# Patient Record
Sex: Female | Born: 1960
Health system: Southern US, Community
[De-identification: ages and names within clinical notes are randomized; demographics above are authoritative.]

## PROBLEM LIST (undated history)

## (undated) DIAGNOSIS — K219 Gastro-esophageal reflux disease without esophagitis: Secondary | ICD-10-CM

## (undated) DIAGNOSIS — E785 Hyperlipidemia, unspecified: Secondary | ICD-10-CM

## (undated) DIAGNOSIS — F419 Anxiety disorder, unspecified: Secondary | ICD-10-CM

## (undated) DIAGNOSIS — H269 Unspecified cataract: Secondary | ICD-10-CM

## (undated) DIAGNOSIS — J189 Pneumonia, unspecified organism: Secondary | ICD-10-CM

## (undated) DIAGNOSIS — R51 Headache: Secondary | ICD-10-CM

## (undated) DIAGNOSIS — D126 Benign neoplasm of colon, unspecified: Secondary | ICD-10-CM

## (undated) DIAGNOSIS — F1910 Other psychoactive substance abuse, uncomplicated: Secondary | ICD-10-CM

## (undated) DIAGNOSIS — K589 Irritable bowel syndrome without diarrhea: Secondary | ICD-10-CM

## (undated) DIAGNOSIS — F1021 Alcohol dependence, in remission: Secondary | ICD-10-CM

## (undated) DIAGNOSIS — R519 Headache, unspecified: Secondary | ICD-10-CM

## (undated) DIAGNOSIS — T7840XA Allergy, unspecified, initial encounter: Secondary | ICD-10-CM

## (undated) DIAGNOSIS — I10 Essential (primary) hypertension: Secondary | ICD-10-CM

## (undated) DIAGNOSIS — F32A Depression, unspecified: Secondary | ICD-10-CM

## (undated) DIAGNOSIS — F329 Major depressive disorder, single episode, unspecified: Secondary | ICD-10-CM

## (undated) DIAGNOSIS — K52839 Microscopic colitis, unspecified: Secondary | ICD-10-CM

## (undated) DIAGNOSIS — Z8489 Family history of other specified conditions: Secondary | ICD-10-CM

## (undated) DIAGNOSIS — D649 Anemia, unspecified: Secondary | ICD-10-CM

## (undated) DIAGNOSIS — G709 Myoneural disorder, unspecified: Secondary | ICD-10-CM

## (undated) DIAGNOSIS — N951 Menopausal and female climacteric states: Secondary | ICD-10-CM

## (undated) DIAGNOSIS — J309 Allergic rhinitis, unspecified: Secondary | ICD-10-CM

## (undated) DIAGNOSIS — F418 Other specified anxiety disorders: Secondary | ICD-10-CM

## (undated) DIAGNOSIS — Z8701 Personal history of pneumonia (recurrent): Secondary | ICD-10-CM

## (undated) DIAGNOSIS — G8929 Other chronic pain: Secondary | ICD-10-CM

## (undated) DIAGNOSIS — M1990 Unspecified osteoarthritis, unspecified site: Secondary | ICD-10-CM

## (undated) HISTORY — PX: EYE SURGERY: SHX253

## (undated) HISTORY — PX: APPENDECTOMY: SHX54

## (undated) HISTORY — DX: Other specified anxiety disorders: F41.8

## (undated) HISTORY — DX: Anemia, unspecified: D64.9

## (undated) HISTORY — DX: Other chronic pain: G89.29

## (undated) HISTORY — DX: Myoneural disorder, unspecified: G70.9

## (undated) HISTORY — DX: Allergy, unspecified, initial encounter: T78.40XA

## (undated) HISTORY — DX: Irritable bowel syndrome, unspecified: K58.9

## (undated) HISTORY — DX: Hyperlipidemia, unspecified: E78.5

## (undated) HISTORY — DX: Gastro-esophageal reflux disease without esophagitis: K21.9

## (undated) HISTORY — PX: POLYPECTOMY: SHX149

## (undated) HISTORY — DX: Other psychoactive substance abuse, uncomplicated: F19.10

## (undated) HISTORY — DX: Essential (primary) hypertension: I10

## (undated) HISTORY — DX: Major depressive disorder, single episode, unspecified: F32.9

## (undated) HISTORY — PX: SHOULDER SURGERY: SHX246

## (undated) HISTORY — DX: Personal history of pneumonia (recurrent): Z87.01

## (undated) HISTORY — DX: Microscopic colitis, unspecified: K52.839

## (undated) HISTORY — DX: Headache, unspecified: R51.9

## (undated) HISTORY — DX: Menopausal and female climacteric states: N95.1

## (undated) HISTORY — DX: Headache: R51

## (undated) HISTORY — PX: NOSE SURGERY: SHX723

## (undated) HISTORY — DX: Pneumonia, unspecified organism: J18.9

## (undated) HISTORY — DX: Alcohol dependence, in remission: F10.21

## (undated) HISTORY — DX: Unspecified osteoarthritis, unspecified site: M19.90

## (undated) HISTORY — DX: Allergic rhinitis, unspecified: J30.9

## (undated) HISTORY — DX: Unspecified cataract: H26.9

## (undated) HISTORY — DX: Depression, unspecified: F32.A

## (undated) HISTORY — DX: Benign neoplasm of colon, unspecified: D12.6

## (undated) HISTORY — DX: Anxiety disorder, unspecified: F41.9

## (undated) HISTORY — PX: OTHER SURGICAL HISTORY: SHX169

## (undated) HISTORY — PX: TONSILLECTOMY: SUR1361

---

## 1988-06-06 HISTORY — PX: ABDOMINAL HYSTERECTOMY: SHX81

## 2000-06-06 HISTORY — PX: LUMBAR DISC SURGERY: SHX700

## 2002-10-10 ENCOUNTER — Other Ambulatory Visit: Admission: RE | Admit: 2002-10-10 | Discharge: 2002-10-10 | Payer: Self-pay | Admitting: Gynecology

## 2004-06-02 ENCOUNTER — Ambulatory Visit (HOSPITAL_COMMUNITY): Admission: RE | Admit: 2004-06-02 | Discharge: 2004-06-02 | Payer: Self-pay

## 2004-06-06 HISTORY — PX: ANTERIOR CERVICAL DECOMP/DISCECTOMY FUSION: SHX1161

## 2004-07-21 ENCOUNTER — Encounter: Admission: RE | Admit: 2004-07-21 | Discharge: 2004-07-21 | Payer: Self-pay | Admitting: Neurosurgery

## 2004-08-16 ENCOUNTER — Encounter: Admission: RE | Admit: 2004-08-16 | Discharge: 2004-08-16 | Payer: Self-pay | Admitting: Neurosurgery

## 2004-10-06 ENCOUNTER — Ambulatory Visit (HOSPITAL_COMMUNITY): Admission: RE | Admit: 2004-10-06 | Discharge: 2004-10-06 | Payer: Self-pay

## 2004-11-17 ENCOUNTER — Ambulatory Visit (HOSPITAL_COMMUNITY): Admission: RE | Admit: 2004-11-17 | Discharge: 2004-11-18 | Payer: Self-pay | Admitting: Neurosurgery

## 2004-12-17 ENCOUNTER — Ambulatory Visit (HOSPITAL_COMMUNITY): Admission: RE | Admit: 2004-12-17 | Discharge: 2004-12-18 | Payer: Self-pay | Admitting: Neurosurgery

## 2008-06-06 HISTORY — PX: OTHER SURGICAL HISTORY: SHX169

## 2008-11-21 ENCOUNTER — Encounter: Admission: RE | Admit: 2008-11-21 | Discharge: 2008-11-21 | Payer: Self-pay | Admitting: Gynecology

## 2009-04-21 ENCOUNTER — Encounter: Admission: RE | Admit: 2009-04-21 | Discharge: 2009-04-21 | Payer: Self-pay | Admitting: Neurosurgery

## 2009-05-14 ENCOUNTER — Encounter: Admission: RE | Admit: 2009-05-14 | Discharge: 2009-05-14 | Payer: Self-pay | Admitting: Neurosurgery

## 2010-04-19 ENCOUNTER — Encounter: Admission: RE | Admit: 2010-04-19 | Discharge: 2010-04-19 | Payer: Self-pay | Admitting: Neurosurgery

## 2010-05-03 ENCOUNTER — Encounter: Admission: RE | Admit: 2010-05-03 | Discharge: 2010-05-03 | Payer: Self-pay | Admitting: Neurosurgery

## 2010-05-19 ENCOUNTER — Encounter
Admission: RE | Admit: 2010-05-19 | Discharge: 2010-05-19 | Payer: Self-pay | Source: Home / Self Care | Attending: Neurosurgery | Admitting: Neurosurgery

## 2010-10-22 NOTE — Op Note (Signed)
NAMENOVELLA, ABRAHA                ACCOUNT NO.:  [id number]   MEDICAL RECORD NO.:  [id number]          PATIENT TYPE:  OIB   LOCATION:  3016                         FACILITY:  [facility]   PHYSICIAN:  Coletta Memos, M.D.     DATE OF BIRTH:  1961-02-14   DATE OF PROCEDURE:  12/17/2004  DATE OF DISCHARGE:                                 OPERATIVE REPORT   Ms. Degrasse is a 50 year old woman who presented initially with low back  pain. I performed a far lateral diskectomy at L4-5 in June. She has done  quite well from that operation and came back wanting to go ahead and get her  cervical procedure done. She has a spondylitic foramen which is narrowed at  C5-6 on the right side. I recommended and she agreed to undergo operative  decompression after conservative measures were not helpful.   OPERATIVE NOTE:  Ms. Rasp was brought to the operating room, intubated and  placed under general anesthetic without difficulty. She was placed with her  head in slight extension without traction on a horseshoe headrest. The neck  was prepped and she was draped in sterile fashion. I infiltrated 3 mL of  0.5% lidocaine with 1:200,000 strength epinephrine starting at the midline  and extending to the medial border of the left sternocleidomastoid at the  level of the cricoid cartilage. I opened the skin with a #10 blade and took  this down to the platysma. I dissected above the platysma rostrally and  caudally. I opened the platysma in a horizontal fashion with Metzenbaum  scissors along the length of the incision. I then dissected inferior to the  platysma rostrally and caudally. I then was able to find with dissection an  avascular plane to the anterior spine, retracting the sternocleidomastoid  and carotid artery along with the jugular structures laterally and the strap  muscles medially. I placed a spinal needle that showed that I was a 6-7. I  then moved that one level. I then opened the disk space with a #15  blade and  removed some disk material. I then placed two distraction pins, one at C5  and the other at C6 and distracted the disk space. The microscope brought in  to aid in microdissection and I started the rest of the disk removal. When  that was performed, I then had some thickened ligament along with  osteophytes from both C5 and C6. I made sure to work out into the right  neural foramen and ensure that there was no pressure left on the right C6  nerve root. I was not nearly as aggressive on the left side as she had no  complaints on the left side. I then irrigated the wound. With Dr. Verlee Rossetti  assistance, we placed 7 mm bone graft into the disk space. We removed the  distraction pins and then placed a 16 mm Synthes ACS plate. The bone graft  was sent Synthes ACCS bone graft. An x-ray showed that the plate, plug and  screws were all in good position. I then irrigated the wound. I then closed  the wound in layered fashion using Vicryl sutures, reapproximating the  platysma and subcutaneous layers. Dermabond used for sterile dressing.       KC/MEDQ  D:  12/17/2004  T:  12/17/2004  Job:  [id number]

## 2010-10-22 NOTE — Op Note (Signed)
NAMERANDA, Elizabeth Long                ACCOUNT NO.:  [id number]   MEDICAL RECORD NO.:  [id number]          PATIENT TYPE:  OIB   LOCATION:  2899                         FACILITY:  [facility]   PHYSICIAN:  Coletta Memos, M.D.     DATE OF BIRTH:  [birth year]   DATE OF PROCEDURE:  11/17/2004  DATE OF DISCHARGE:                                 OPERATIVE REPORT   PREOPERATIVE DIAGNOSIS:  Far lateral disk herniation of L4-5, right.   POSTOPERATIVE DIAGNOSIS:  Herniated nucleus pulposis and lumbar  radiculopathy.   SURGEON:  Coletta Memos, M.D.   ASSISTANT:  None.   ANESTHESIA:  General endotracheal.   COMPLICATIONS:  None.   FINDINGS:  A small hump in the far lateral disk space which corresponded to  the MRI.   INDICATIONS:  Elizabeth Long is a woman who has undergone epidural injections  and other conservative treatment without relief of pain in the right lower  extremity. I felt this did correspond to the right L4-5 disk which was seen  on MRI. Though not large, it was in good location and again it did explain  her symptoms. I recommended and she did agree to undergo operative  decompression.   OPERATIVE NOTE:  Elizabeth Long was brought to the operating room, intubated  and placed under general anesthesia without difficulty. She was rolled prone  onto a Wilson frame and all pressure points were properly padded. A spinal  needle was placed for preoperative localization and showed that the needle  was pointing to the L4 spinous process. I then infiltrated 15 mL of 0.5%  lidocaine and 1:200,000 strength epinephrine into the lumbar region. I  opened the skin with a #10 blade and took this down to the thoracolumbar  fascia sharply. I then exposed the lamina of what was L4. I placed a double-  ended ganglion knife inferior to that lamina and then found the pars  interarticularis of that L4 lamina. I then drilled off the lateral aspect of  it, possibly one-quarter.  I then got underneath that,  removed the  ligamentum flavum and identified the right L4 nerve root. I then used a  nerve hook and probed underneath and felt no disk herniation or compromise  of the nerve root proximally, but going out further lateral I did appreciate  a hump in the disk space. I then opened the disk space with a #15 blade  after coagulating and controlling epidural bleeding. I removed disk in a  piecemeal fashion using microscopic dissection, Kerrison punch and Epstein  curette. When I was satisfied with the decompression, I inspected the nerve  root once more. I felt that  there was no compression on the nerve root nor loose disk pieces. I then  irrigated. I then closed the wound after placing 10 mL of Solu-Medrol over  the resection site. Closed with one-layer fashion, reapproximating the  thoracolumbar fascia, subcutaneous tissue and subcuticular layer. Dermabond  used for sterile dressing.       KC/MEDQ  D:  11/17/2004  T:  11/17/2004  Job:  [id number]

## 2011-06-02 ENCOUNTER — Telehealth: Payer: Self-pay | Admitting: Internal Medicine

## 2011-06-02 NOTE — Telephone Encounter (Signed)
Unable to dial the above number, I only get a fast busy signal.  I will continue to try and call.  (You cant call [place] at [phone] get the same fast busy signal)

## 2011-06-03 NOTE — Telephone Encounter (Signed)
Unable to reach the number. Fast busy signal continures.

## 2011-06-07 HISTORY — PX: COLONOSCOPY: SHX174

## 2011-06-08 NOTE — Telephone Encounter (Signed)
Office is still closed I will try and contact them later

## 2011-06-08 NOTE — Telephone Encounter (Signed)
I have been unable to reach the patient or Dr Vear Clock office.  There is a machine at his office that says their office is closed.  Unable to leave a message.

## 2011-06-09 ENCOUNTER — Encounter: Payer: Self-pay | Admitting: *Deleted

## 2011-06-09 ENCOUNTER — Telehealth: Payer: Self-pay | Admitting: *Deleted

## 2011-06-09 NOTE — Telephone Encounter (Signed)
Spoke with Miachel Roux at Dr. Vear Clock office and told her of patient's OV  Scheduled on 06/21/11. She will fax [place] records on patient.

## 2011-06-09 NOTE — Telephone Encounter (Signed)
New patient letter mailed to patient.

## 2011-06-09 NOTE — Telephone Encounter (Signed)
Spoke with patient and scheduled her with Dr. Juanda Chance on 06/21/11 at 1:45/2:00 PM. She states she saw Dr. Juanda Chance years ago for [facility].

## 2011-06-10 ENCOUNTER — Encounter: Payer: Self-pay | Admitting: *Deleted

## 2011-06-21 ENCOUNTER — Other Ambulatory Visit (INDEPENDENT_AMBULATORY_CARE_PROVIDER_SITE_OTHER): Payer: BC Managed Care – PPO

## 2011-06-21 ENCOUNTER — Encounter: Payer: Self-pay | Admitting: Internal Medicine

## 2011-06-21 ENCOUNTER — Ambulatory Visit (INDEPENDENT_AMBULATORY_CARE_PROVIDER_SITE_OTHER): Payer: Self-pay | Admitting: Internal Medicine

## 2011-06-21 DIAGNOSIS — R197 Diarrhea, unspecified: Secondary | ICD-10-CM

## 2011-06-21 DIAGNOSIS — R634 Abnormal weight loss: Secondary | ICD-10-CM

## 2011-06-21 LAB — SEDIMENTATION RATE: Sed Rate: 21 mm/hr (ref 0–22)

## 2011-06-21 MED ORDER — DIPHENOXYLATE-ATROPINE 2.5-0.025 MG PO TABS: 1.0000 | ORAL_TABLET | ORAL | Status: AC

## 2011-06-21 MED ORDER — PEG-KCL-NACL-NASULF-NA ASC-C 100 G PO SOLR: 1.0000 | Freq: Once | ORAL | Status: DC

## 2011-06-21 MED ORDER — METRONIDAZOLE 250 MG PO TABS: 250.0000 mg | ORAL_TABLET | Freq: Three times a day (TID) | ORAL | Status: AC

## 2011-06-21 NOTE — Patient Instructions (Addendum)
Your physician has requested that you go to the basement for the following lab work before leaving today: Sedimentation Rate, Sprue Profile, Stool for O&P, lactoferrin, C Diff. We have sent the following medications to your pharmacy for you to pick up at your convenience: Lomotil Flagyl You have been scheduled for a colonoscopy. Please follow written instructions given to you at your visit today.  Please pick up your prep kit at the pharmacy within the next 2-3 days. CC: Dr Ch.Phillips

## 2011-06-21 NOTE — Progress Notes (Signed)
Elizabeth Long 13-Dec-1960 MRN [id number]     History of Present Illness:  This is a 51 year old white female with severe diarrhea since she broke up with her boyfriend several months ago. She has lost about 20 pounds from 140 pounds to a current 123 pounds. Her stools are watery and she has them up to 15 times a day but not at night. She denies any rectal bleeding or significant abdominal pain. She has not been on any antibiotics.  We saw her in 1998 for diarrhea and she underwent a colonoscopy and random biopsies of the colon to rule out microscopic colitis. The exam was normal. She was treated with Librax and Levbid. Her symptoms improved but flared up again recently. In 1998 her weight was 110 pounds and she subsequently gained weight up to 150 pounds and only in recent years lost down to 123 pounds. Her appetite has been good and she denies nausea or vomiting. She has urgent bowel movements, usually postprandially. She has been on Librax 3 times a day with no significant improvement. She is also on Topamax 100 mg, Mobic, Imodium and Ativan 2 mg when necessary for stress.   Past Medical History  Diagnosis Date  . Alcoholism     18 and 1/2 years sober  . Anxiety   . Arthritis   . Chronic headaches   . Depression   . GERD (gastroesophageal reflux disease)   . Irritable bowel syndrome (IBS)   . Pneumonia     hx of    Past Surgical History  Procedure Date  . Posterior laminectomy / decompression cervical spine   . Back surgery   . Appendectomy   . Abdominal hysterectomy   . Shoulder surgery   . Tonsillectomy   . Nose surgery     reports that she has quit smoking. She has never used smokeless tobacco. She reports that she does not drink alcohol or use illicit drugs. family history is not on file. Allergies  Allergen Reactions  . Cefotan (Cefotetan Disodium)   . Sulfur         Review of Systems: Denies nausea or vomiting, dysphagia odynophagia  The remainder of the 10 point  ROS is negative except as outlined in H&P   Physical Exam: General appearance  Well developed, in no distress. Eyes- non icteric. HEENT nontraumatic, normocephalic. Mouth no lesions, tongue papillated, no cheilosis. Neck supple without adenopathy, thyroid not enlarged, no carotid bruits, no JVD. Lungs Clear to auscultation bilaterally. Cor normal S1, normal S2, regular rhythm, no murmur,  quiet precordium. Abdomen: Soft abdomen with hyperactive bowel sounds and few abdominal rushes. Nondistended. Nontender. No palpable mass. Liver edge at costal margin. No CVA tenderness. Rectal: Soft Hemoccult negative stool Extremities no pedal edema. Skin no lesions. Neurological alert and oriented x 3. Psychological normal mood and affect.  Assessment and Plan:  Problem #1 Chronic diarrhea with recent exacerbation. She was evaluated for diarrhea in 1998 and felt to have irritable bowel syndrome. This time she has significant weight loss. Her TSH is normal. We will obtain a sprue profile as well as sedimentation rate to rule out inflammatory bowel disease and we will proceed with a colonoscopy and random biopsies to rule out microscopic colitis. We will also rule out Crohn's disease or ulcerative colitis. We will obtain stool specimens for C. difficile, lactoferrin, culture and O&P. She will continue her Librax and antidiarrheal agents as needed. I will also give her an empirical trial of Flagyl 250 mg by mouth  3 times a day for week for bacterial overgrowth.   06/21/2011 Lina Sar

## 2011-06-22 ENCOUNTER — Other Ambulatory Visit: Payer: BC Managed Care – PPO

## 2011-06-22 DIAGNOSIS — R634 Abnormal weight loss: Secondary | ICD-10-CM

## 2011-06-22 DIAGNOSIS — R197 Diarrhea, unspecified: Secondary | ICD-10-CM

## 2011-06-22 LAB — CELIAC PANEL 10
Endomysial Screen: NEGATIVE
Tissue Transglutaminase Ab, IgA: 12.8 U/mL (ref <20)

## 2011-06-23 ENCOUNTER — Telehealth: Payer: Self-pay | Admitting: *Deleted

## 2011-06-23 LAB — FECAL LACTOFERRIN, QUANT: Lactoferrin: POSITIVE

## 2011-06-23 LAB — OVA AND PARASITE SCREEN: OP: NONE SEEN

## 2011-06-23 NOTE — Telephone Encounter (Signed)
Left a message for patient to call me back.

## 2011-06-23 NOTE — Telephone Encounter (Signed)
Message copied by Daphine Deutscher on Thu Jun 23, 2011 11:33 AM ------      Message from: Hart Carwin      Created: Thu Jun 23, 2011 10:53 AM       Please call pt with positive stool test which may indicate colitis. She needs to keep her appointment for colonoscopy. Her sprue profile is negative.

## 2011-06-23 NOTE — Telephone Encounter (Signed)
Spoke with patient and gave her results and recommendations. 

## 2011-06-23 NOTE — Telephone Encounter (Signed)
Message copied by Elizabeth Long on Thu Jun 23, 2011  2:36 PM ------      Message from: Hart Carwin      Created: Thu Jun 23, 2011 10:53 AM       Please call pt with positive stool test which may indicate colitis. She needs to keep her appointment for colonoscopy. Her sprue profile is negative.

## 2011-06-23 NOTE — Telephone Encounter (Signed)
Left a message for patient to call me. 

## 2011-07-06 ENCOUNTER — Encounter: Payer: Self-pay | Admitting: Internal Medicine

## 2011-07-06 ENCOUNTER — Ambulatory Visit (AMBULATORY_SURGERY_CENTER): Payer: BC Managed Care – PPO | Admitting: Internal Medicine

## 2011-07-06 VITALS — BP 102/66 | HR 79 | Temp 96.0°F | Resp 21 | Ht 66.0 in | Wt 123.0 lb

## 2011-07-06 DIAGNOSIS — D126 Benign neoplasm of colon, unspecified: Secondary | ICD-10-CM

## 2011-07-06 DIAGNOSIS — K5289 Other specified noninfective gastroenteritis and colitis: Secondary | ICD-10-CM

## 2011-07-06 DIAGNOSIS — R197 Diarrhea, unspecified: Secondary | ICD-10-CM

## 2011-07-06 MED ORDER — SODIUM CHLORIDE 0.9 % IV SOLN: 500.0000 mL | INTRAVENOUS | Status: DC

## 2011-07-06 NOTE — Progress Notes (Signed)
Patient did not experience any of the following events: a burn prior to discharge; a fall within the facility; wrong site/side/patient/procedure/implant event; or a hospital transfer or hospital admission upon discharge from the facility. (G8907) Patient did not have preoperative order for IV antibiotic SSI prophylaxis. (G8918)  

## 2011-07-06 NOTE — Patient Instructions (Signed)
Resume all medications. Information given on polyps. D/C Information reviewed with family.

## 2011-07-06 NOTE — Progress Notes (Signed)
PROPOFOL ADMINISTERED CONTINUOUSLY THROUGH OUT THE PROCEDURE PER S CAMP CRNA. SEE SCANNED INTRA PROCEDURE REPORT.EWM

## 2011-07-06 NOTE — Op Note (Signed)
[facility] [address] [place], [place]  [address]  COLONOSCOPY PROCEDURE REPORT  PATIENT:  [name], Elizabeth Long  MR#:  [id number] BIRTHDATE:  12-Sep-1960, 50 yrs. old  GENDER:  female ENDOSCOPIST:  Hedwig Morton. Juanda Chance, MD REF. BY:  Loma Sender, M.D. PROCEDURE DATE:  07/06/2011 PROCEDURE:  Colonoscopy with biopsy and snare polypectomy ASA CLASS:  Class I INDICATIONS:  unexplained diarrhea colon 1999 for diarrhea was normal MEDICATIONS:   MAC sedation, administered by CRNA, propofol (Diprivan) 320 mg  DESCRIPTION OF PROCEDURE:   After the risks and benefits and of the procedure were explained, informed consent was obtained. Digital rectal exam was performed and revealed no rectal masses. The LB PCF-H180AL [id number] endoscope was introduced through the anus and advanced to the cecum, which was identified by both the appendix and ileocecal valve.  The quality of the prep was good, using MoviPrep.  The instrument was then slowly withdrawn as the colon was fully examined. <<PROCEDUREIMAGES>>  FINDINGS:  A sessile polyp was found. large carpeted polyp at 70 cm Polyps were snared without cautery. Retrieval was successful (see image1). snare polyp  This was otherwise a normal examination of the colon. Random biopsies were obtained and sent to pathology (see image2, image3, image4, image5, and image6). r/o microscpic colitis   Retroflexed views in the rectum revealed no abnormalities.    The scope was then withdrawn from the patient and the procedure completed.  COMPLICATIONS:  None ENDOSCOPIC IMPRESSION: 1) Sessile polyp 2) Otherwise normal examination RECOMMENDATIONS: 1) Await biopsy results 2) Await pathology results continue Levsin SL.125 mg  REPEAT EXAM:  In 5 year(s) for.  ______________________________ Hedwig Morton. Juanda Chance, MD  CC:  n. eSIGNED:   Hedwig Morton. Brodie at 07/06/2011 12:18 PM  Ilda Basset, [phone]

## 2011-07-07 ENCOUNTER — Telehealth: Payer: Self-pay | Admitting: *Deleted

## 2011-07-07 NOTE — Telephone Encounter (Signed)
Left message with pts permission on number given yesterday in admitting. ewm

## 2011-07-11 ENCOUNTER — Encounter: Payer: Self-pay | Admitting: Internal Medicine

## 2011-07-12 ENCOUNTER — Encounter: Payer: Self-pay | Admitting: *Deleted

## 2011-07-12 ENCOUNTER — Other Ambulatory Visit: Payer: Self-pay | Admitting: *Deleted

## 2011-07-12 ENCOUNTER — Telehealth: Payer: Self-pay | Admitting: *Deleted

## 2011-07-12 DIAGNOSIS — K529 Noninfective gastroenteritis and colitis, unspecified: Secondary | ICD-10-CM

## 2011-07-12 DIAGNOSIS — K52839 Microscopic colitis, unspecified: Secondary | ICD-10-CM | POA: Insufficient documentation

## 2011-07-12 MED ORDER — PREDNISONE 5 MG PO TABS: ORAL_TABLET | ORAL | Status: DC

## 2011-07-12 NOTE — Telephone Encounter (Signed)
Message copied by Daphine Deutscher on Tue Jul 12, 2011  9:27 AM ------      Message from: Hart Carwin      Created: Mon Jul 11, 2011  5:30 PM       Please send Prednisone 5 mg, # 100, 2 po qd x 2 weeks,11/2 =7.5 mg po qd x 2 weeks, 1 po qd x 2 weeks, 1/2=2.5 mg po qd x 2weeks, 1 refill

## 2011-07-12 NOTE — Telephone Encounter (Signed)
Rx had been sent by T. Gordy Levan, RN. Spoke with patient and told her about rx and bx results. Also scheduled her for OV as per letter sent by Dr. Juanda Chance (08/17/11 at 3:45 PM)

## 2011-08-11 ENCOUNTER — Encounter: Payer: Self-pay | Admitting: *Deleted

## 2011-08-17 ENCOUNTER — Encounter: Payer: Self-pay | Admitting: Internal Medicine

## 2011-08-17 ENCOUNTER — Ambulatory Visit (INDEPENDENT_AMBULATORY_CARE_PROVIDER_SITE_OTHER): Payer: BC Managed Care – PPO | Admitting: Internal Medicine

## 2011-08-17 VITALS — BP 102/70 | HR 68 | Ht 66.0 in | Wt 124.6 lb

## 2011-08-17 DIAGNOSIS — K520 Gastroenteritis and colitis due to radiation: Secondary | ICD-10-CM

## 2011-08-17 DIAGNOSIS — R197 Diarrhea, unspecified: Secondary | ICD-10-CM

## 2011-08-17 NOTE — Progress Notes (Signed)
Elizabeth Long 1961-03-15 MRN [id number]   History of Present Illness:  This is a 51 year old white female with a new diagnosis of microscopic colitis. Biopsies showed increased collagen deposits. She has had chronic diarrhea off-and-on which progressed in recent months. There has been associated weight loss. A colonoscopy in January of this year showed a tubular adenoma and microscopic colitis. She discontinued her meloxicam but has remained on Zoloft 100 mg a day. Both medications could cause microscopic colitis. She was started on prednisone 20 mg daily which is being tapered gradually every 2 weeks down to 10 mg a day with complete resolution of her symptoms. She currently has soft bowel movements once or twice a day without abdominal pain or bleeding. She has not taken any Lomotil. Her weight has increased to 124 pounds.   Past Medical History  Diagnosis Date  . Alcoholism     18 and 1/2 years sober  . Anxiety   . Arthritis   . Chronic headaches   . Depression   . GERD (gastroesophageal reflux disease)   . Irritable bowel syndrome (IBS)   . Pneumonia     hx of   . Allergy   . Substance abuse   . Microscopic colitis   . Tubular adenoma of colon    Past Surgical History  Procedure Date  . Posterior laminectomy / decompression cervical spine   . Back surgery   . Appendectomy   . Abdominal hysterectomy   . Shoulder surgery   . Tonsillectomy   . Nose surgery   . Colonoscopy     reports that she has quit smoking. She has never used smokeless tobacco. She reports that she does not drink alcohol or use illicit drugs. @FAMHXP (<SUBSCRIPT> error)@ Allergies  Allergen Reactions  . Cefotan (Cefotetan Disodium)   . Sulfur         Review of Systems: Good appetite. No abdominal pain. Negative for rectal bleeding  The remainder of the 10 point ROS is negative except as outlined in H&P   Physical Exam: General appearance  Well developed, in no distress. Eyes- non  icteric. HEENT nontraumatic, normocephalic. Mouth no lesions, tongue papillated, no cheilosis. Neck supple without adenopathy, thyroid not enlarged, no carotid bruits, no JVD. Lungs Clear to auscultation bilaterally. Cor normal S1, normal S2, regular rhythm, no murmur,  quiet precordium. Abdomen: Soft nontender with normoactive bowel sounds. Rectal: Not done Extremities no pedal edema. Skin no lesions. Neurological alert and oriented x 3. Psychological normal mood and affect.  Assessment and Plan:   Problem #1 Diagnosis of microscopic colitis, likely collagenous. This has much improved on low-dose steroids. She will continue the taper by 5 mg every 2 weeks and will come back to see me in 6-8 weeks. She will continue Zoloft for now. If diarrhea recurs, we may consider the use of Imuran.   08/17/2011 Lina Sar

## 2011-08-17 NOTE — Patient Instructions (Addendum)
Continue Prednisone as per Dr Regino Schultze recommendations. Return to see Dr Juanda Chance in 2 months. CC: Dr Lodema Hong, Dr Loma Sender

## 2011-11-28 ENCOUNTER — Other Ambulatory Visit: Payer: Self-pay

## 2012-10-24 LAB — LIPID PANEL
Cholesterol: 255
HDL: 87
LDL CALC: 149
Triglycerides: 93

## 2012-10-24 LAB — TSH: TSH: 3

## 2012-10-24 LAB — BASIC METABOLIC PANEL
CREATININE: 0.82
Calcium: 10.5 mg/dL
GLUCOSE: 101
Potassium: 4.1 mmol/L
Sodium: 139

## 2013-01-21 ENCOUNTER — Other Ambulatory Visit: Payer: Self-pay | Admitting: Gynecology

## 2014-01-30 ENCOUNTER — Other Ambulatory Visit: Payer: Self-pay | Admitting: Gynecology

## 2014-01-30 DIAGNOSIS — R928 Other abnormal and inconclusive findings on diagnostic imaging of breast: Secondary | ICD-10-CM

## 2014-02-05 ENCOUNTER — Ambulatory Visit
Admission: RE | Admit: 2014-02-05 | Discharge: 2014-02-05 | Disposition: A | Discharge status: Home / Self Care | Payer: BC Managed Care – PPO | Source: Ambulatory Visit | Attending: Gynecology | Admitting: Gynecology

## 2014-02-05 DIAGNOSIS — R928 Other abnormal and inconclusive findings on diagnostic imaging of breast: Secondary | ICD-10-CM

## 2014-11-21 ENCOUNTER — Other Ambulatory Visit: Payer: Self-pay

## 2014-11-21 DIAGNOSIS — Z1231 Encounter for screening mammogram for malignant neoplasm of breast: Secondary | ICD-10-CM

## 2015-02-10 ENCOUNTER — Ambulatory Visit
Admission: RE | Admit: 2015-02-10 | Discharge: 2015-02-10 | Disposition: A | Discharge status: Home / Self Care | Payer: BLUE CROSS/BLUE SHIELD | Source: Ambulatory Visit

## 2015-02-10 DIAGNOSIS — Z1231 Encounter for screening mammogram for malignant neoplasm of breast: Secondary | ICD-10-CM

## 2015-02-13 ENCOUNTER — Other Ambulatory Visit: Payer: Self-pay | Admitting: Obstetrics & Gynecology

## 2015-02-13 DIAGNOSIS — R928 Other abnormal and inconclusive findings on diagnostic imaging of breast: Secondary | ICD-10-CM

## 2015-02-19 ENCOUNTER — Ambulatory Visit
Admission: RE | Admit: 2015-02-19 | Discharge: 2015-02-19 | Disposition: A | Discharge status: Home / Self Care | Payer: BLUE CROSS/BLUE SHIELD | Source: Ambulatory Visit | Attending: Obstetrics & Gynecology | Admitting: Obstetrics & Gynecology

## 2015-02-19 DIAGNOSIS — R928 Other abnormal and inconclusive findings on diagnostic imaging of breast: Secondary | ICD-10-CM

## 2015-08-04 LAB — LIPID PANEL
Cholesterol: 210 mg/dL — AB (ref 0–200)
HDL: 93 mg/dL — AB (ref 35–70)
LDL Cholesterol: 95 mg/dL
Triglycerides: 107 mg/dL (ref 40–160)

## 2015-09-28 ENCOUNTER — Other Ambulatory Visit: Payer: Self-pay | Admitting: Obstetrics & Gynecology

## 2015-09-28 DIAGNOSIS — Z01419 Encounter for gynecological examination (general) (routine) without abnormal findings: Secondary | ICD-10-CM | POA: Diagnosis not present

## 2015-09-28 DIAGNOSIS — N760 Acute vaginitis: Secondary | ICD-10-CM | POA: Diagnosis not present

## 2015-09-29 DIAGNOSIS — Z113 Encounter for screening for infections with a predominantly sexual mode of transmission: Secondary | ICD-10-CM | POA: Diagnosis not present

## 2015-10-23 ENCOUNTER — Ambulatory Visit (INDEPENDENT_AMBULATORY_CARE_PROVIDER_SITE_OTHER): Payer: BLUE CROSS/BLUE SHIELD | Admitting: Family Medicine

## 2015-10-23 ENCOUNTER — Encounter: Payer: Self-pay | Admitting: Family Medicine

## 2015-10-23 VITALS — BP 132/70 | HR 62 | Temp 97.6°F | Ht 65.0 in | Wt 137.0 lb

## 2015-10-23 DIAGNOSIS — R0989 Other specified symptoms and signs involving the circulatory and respiratory systems: Secondary | ICD-10-CM

## 2015-10-23 DIAGNOSIS — G8929 Other chronic pain: Secondary | ICD-10-CM

## 2015-10-23 DIAGNOSIS — M8949 Other hypertrophic osteoarthropathy, multiple sites: Secondary | ICD-10-CM

## 2015-10-23 DIAGNOSIS — M159 Polyosteoarthritis, unspecified: Secondary | ICD-10-CM

## 2015-10-23 DIAGNOSIS — F1021 Alcohol dependence, in remission: Secondary | ICD-10-CM | POA: Diagnosis not present

## 2015-10-23 DIAGNOSIS — K219 Gastro-esophageal reflux disease without esophagitis: Secondary | ICD-10-CM

## 2015-10-23 DIAGNOSIS — K52839 Microscopic colitis, unspecified: Secondary | ICD-10-CM

## 2015-10-23 DIAGNOSIS — N951 Menopausal and female climacteric states: Secondary | ICD-10-CM

## 2015-10-23 DIAGNOSIS — Z780 Asymptomatic menopausal state: Secondary | ICD-10-CM

## 2015-10-23 DIAGNOSIS — F418 Other specified anxiety disorders: Secondary | ICD-10-CM

## 2015-10-23 DIAGNOSIS — M150 Primary generalized (osteo)arthritis: Secondary | ICD-10-CM

## 2015-10-23 DIAGNOSIS — M545 Low back pain: Secondary | ICD-10-CM

## 2015-10-23 DIAGNOSIS — I10 Essential (primary) hypertension: Secondary | ICD-10-CM

## 2015-10-23 DIAGNOSIS — K589 Irritable bowel syndrome without diarrhea: Secondary | ICD-10-CM

## 2015-10-23 MED ORDER — HYDROCHLOROTHIAZIDE 12.5 MG PO CAPS: 12.5000 mg | ORAL_CAPSULE | Freq: Every day | ORAL | Status: DC

## 2015-10-23 MED ORDER — BENZONATATE 100 MG PO CAPS: 100.0000 mg | ORAL_CAPSULE | Freq: Two times a day (BID) | ORAL | Status: DC | PRN

## 2015-10-23 NOTE — Progress Notes (Signed)
BP 132/70 mmHg  Pulse 62  Temp(Src) 97.6 F (36.4 C) (Oral)  Ht 5\' 5"  (1.651 m)  Wt 137 lb (62.143 kg)  BMI 22.80 kg/m2  SpO2 98%   CC: new pt to establish care  Subjective:    Patient ID: Elizabeth Long, female    DOB: 1960/09/30, 55 y.o.   MRN: [id number]  HPI: Elizabeth Long is a 55 y.o. female presenting on 10/23/2015 for Establish Care and Hypertension   Prior saw Dr Hardin Negus who has passed away. She saw him for 47 years. Cares for BF with MS.   HTN - controlled with hctz 12.5mg  daily.   IBS - on librax PRN. H/o microscopic colitis - has seen Dr Olevia Perches with colonoscopy 2013.   Anxiety - on ativan 2mg  nightly for sleep for last 3 years. She also takes effexor 75mg  daily.   Chronic lower back pain and right shoulder pain s/p surgeries - on oxycodone and tramadol prn. Saw Dr Cyndy Freeze. No recent refills. Still has meds at home. Requests we refill this for her. Remotely was on vicodin. Infrequent use. Wants to avoid NSAIDs due to h/o colitis.  Dr Stann Mainland prescribes effexor, estradiol and lorazepam.   Some chest congestion. She has been taking nyquil and robitussin pills. No fevers/chills, but felt feverish last night. She is coughing, minimally productive of mucous. She did recently have URI 1 month ago.   Preventative: COLONOSCOPY Date: 06/2011 TA, microscopic colitis, rpt 5 yrs Olevia Perches) OBGYN Dr Stann Mainland last well woman exam 01/2015, s/p hysterectomy Mammogram yearly - dense breasts, so usually returns for more detailed study.  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 Interim medical history since our last visit reviewed. Allergies and medications reviewed and updated. Current Outpatient Prescriptions on File Prior to Visit  Medication Sig  .  clidinium-chlordiazePOXIDE (LIBRAX) 2.5-5 MG per capsule Takes as directed  . LORazepam (ATIVAN) 2 MG tablet   . meloxicam (MOBIC) 7.5 MG tablet Take 7.5 mg by mouth daily.  Marland Kitchen omeprazole (PRILOSEC) 20 MG capsule One tablet by mouth once daily  . traMADol (ULTRAM) 50 MG tablet Take 50 mg by mouth as needed.   No current facility-administered medications on file prior to visit.    Review of Systems Per HPI unless specifically indicated in ROS section     Objective:    BP 132/70 mmHg  Pulse 62  Temp(Src) 97.6 F (36.4 C) (Oral)  Ht 5\' 5"  (1.651 m)  Wt 137 lb (62.143 kg)  BMI 22.80 kg/m2  SpO2 98%  Wt Readings from Last 3 Encounters:  10/23/15 137 lb (62.143 kg)  08/17/11 124 lb 9.6 oz (56.518 kg)  07/06/11 123 lb (55.792 kg)    Physical Exam  Constitutional: She appears well-developed and well-nourished. No distress.  HENT:  Head: Normocephalic and atraumatic.  Mouth/Throat: Oropharynx is clear and moist. No oropharyngeal exudate.  Eyes: Conjunctivae and EOM are normal. Pupils are equal, round, and reactive to light. No scleral icterus.  Neck: Normal range of motion. Neck supple.  Cardiovascular: Normal rate, regular rhythm, normal heart sounds and intact distal pulses.   No murmur heard. Pulmonary/Chest: Effort normal and breath sounds normal. No respiratory distress. She has no wheezes. She has no rales.  Musculoskeletal: She exhibits no edema.  Lymphadenopathy:    She  has no cervical adenopathy.  Skin: Skin is warm and dry. No rash noted.  Psychiatric: She has a normal mood and affect.  Nursing note and vitals reviewed.      Assessment & Plan:  Over 45 minutes were spent face-to-face with the patient during this encounter and >50% of that time was spent on counseling and coordination of care. EMR reviewed. Records from Dr Hardin Negus, prior PCP reviewed and relevant material sent for scanning. Pt does not want records returned and understands they will otherwise be  shredded. Recent work wellness visit labs also reviewed.  Problem List Items Addressed This Visit    Microscopic colitis    Has seen Dr Olevia Perches dx by biopsy on colonoscopy, completed prednisone course. No further recent diarrheal illness.      History of alcoholism ([place])    18 yrs sober.       Anxiety associated with depression    Seems overall stable on current regimen of effexor XR 75mg  daily, ativan 2mg  nightly. Both prescribed by OBGYN Dr Stann Mainland.      GERD (gastroesophageal reflux disease)    Continue omeprazole 20mg  daily.       Osteoarthritis   Irritable bowel syndrome (IBS)    Continue librax prn. I will refill when next due.       Chest congestion - Primary    With cough. No signs today of bacterial infection - will monitor symptoms for now, discussed red flags to seek urgent care. Trial tessalon perls for cough.      Post menopausal syndrome    On oral and vaginal estrogen per OBGYN.       Essential hypertension    Chronic, stable. Continue low dose hctz daily      Relevant Medications   hydrochlorothiazide (MICROZIDE) 12.5 MG capsule   Chronic lower back pain    Reviewed pain regimen - she rarely uses tramadol and oxycodone, last Rx filled last year. She has also used hydrocodone with relief. I suggested we simplify regimen to tramadol 50mg  BID PRN. Pt agrees and will let me know when she desires refill. She also takes meloxicam 7.5mg  daily.          Follow up plan: Return in about 4 months (around 02/23/2016), or as needed, for annual exam, prior fasting for blood work.  Ria Bush, MD

## 2015-10-23 NOTE — Patient Instructions (Addendum)
Try tessalon perls for cough. Watch for worsening productive cough, fever >101 or worsening after initial improvement - if this happens let us know.  Continue current medicines for now.  Return in 3-4 months for physical. Good to meet you today.

## 2015-10-23 NOTE — Progress Notes (Signed)
Pre visit review using our clinic review tool, if applicable. No additional management support is needed unless otherwise documented below in the visit note. 

## 2015-10-24 ENCOUNTER — Encounter: Payer: Self-pay | Admitting: Family Medicine

## 2015-10-24 ENCOUNTER — Other Ambulatory Visit: Payer: Self-pay | Admitting: Family Medicine

## 2015-10-24 DIAGNOSIS — G8929 Other chronic pain: Secondary | ICD-10-CM | POA: Insufficient documentation

## 2015-10-24 DIAGNOSIS — M545 Low back pain: Secondary | ICD-10-CM

## 2015-10-24 DIAGNOSIS — F418 Other specified anxiety disorders: Secondary | ICD-10-CM | POA: Insufficient documentation

## 2015-10-24 DIAGNOSIS — N951 Menopausal and female climacteric states: Secondary | ICD-10-CM

## 2015-10-24 DIAGNOSIS — I10 Essential (primary) hypertension: Secondary | ICD-10-CM | POA: Insufficient documentation

## 2015-10-24 DIAGNOSIS — K589 Irritable bowel syndrome without diarrhea: Secondary | ICD-10-CM | POA: Insufficient documentation

## 2015-10-24 DIAGNOSIS — F1021 Alcohol dependence, in remission: Secondary | ICD-10-CM | POA: Insufficient documentation

## 2015-10-24 DIAGNOSIS — R0989 Other specified symptoms and signs involving the circulatory and respiratory systems: Secondary | ICD-10-CM | POA: Insufficient documentation

## 2015-10-24 DIAGNOSIS — M1990 Unspecified osteoarthritis, unspecified site: Secondary | ICD-10-CM | POA: Insufficient documentation

## 2015-10-24 DIAGNOSIS — K219 Gastro-esophageal reflux disease without esophagitis: Secondary | ICD-10-CM | POA: Insufficient documentation

## 2015-10-24 HISTORY — DX: Menopausal and female climacteric states: N95.1

## 2015-10-24 HISTORY — DX: Essential (primary) hypertension: I10

## 2015-10-24 NOTE — Assessment & Plan Note (Signed)
-  Continue omeprazole 20 mg daily

## 2015-10-24 NOTE — Assessment & Plan Note (Signed)
On oral and vaginal estrogen per OBGYN.

## 2015-10-24 NOTE — Assessment & Plan Note (Addendum)
Reviewed pain regimen - she rarely uses tramadol and oxycodone, last Rx filled last year. She has also used hydrocodone with relief. I suggested we simplify regimen to tramadol 50mg  BID PRN. Pt agrees and will let me know when she desires refill. She also takes meloxicam 7.5mg  daily.

## 2015-10-24 NOTE — Assessment & Plan Note (Signed)
With cough. No signs today of bacterial infection - will monitor symptoms for now, discussed red flags to seek urgent care. Trial tessalon perls for cough.

## 2015-10-24 NOTE — Assessment & Plan Note (Addendum)
Seems overall stable on current regimen of effexor XR 75mg  daily, ativan 2mg  nightly. Both prescribed by OBGYN Dr Stann Mainland.

## 2015-10-24 NOTE — Assessment & Plan Note (Signed)
Chronic, stable. Continue low dose hctz daily

## 2015-10-24 NOTE — Assessment & Plan Note (Signed)
Continue librax prn. I will refill when next due.

## 2015-10-24 NOTE — Assessment & Plan Note (Signed)
Has seen Dr Olevia Perches dx by biopsy on colonoscopy, completed prednisone course. No further recent diarrheal illness.

## 2015-10-24 NOTE — Assessment & Plan Note (Signed)
18 yrs sober.

## 2015-10-26 MED ORDER — OMEPRAZOLE 20 MG PO CPDR: 20.0000 mg | DELAYED_RELEASE_CAPSULE | Freq: Every day | ORAL | Status: DC

## 2015-10-26 MED ORDER — BENZONATATE 100 MG PO CAPS: 100.0000 mg | ORAL_CAPSULE | Freq: Two times a day (BID) | ORAL | Status: DC | PRN

## 2015-10-26 NOTE — Addendum Note (Signed)
Addended by: Ria Bush on: 10/26/2015 05:25 PM   Modules accepted: Orders, Medications

## 2015-10-27 ENCOUNTER — Other Ambulatory Visit: Payer: Self-pay | Admitting: *Deleted

## 2015-10-27 MED ORDER — BENZONATATE 100 MG PO CAPS: 100.0000 mg | ORAL_CAPSULE | Freq: Two times a day (BID) | ORAL | Status: DC | PRN

## 2015-10-28 DIAGNOSIS — L905 Scar conditions and fibrosis of skin: Secondary | ICD-10-CM | POA: Diagnosis not present

## 2015-10-28 DIAGNOSIS — L820 Inflamed seborrheic keratosis: Secondary | ICD-10-CM | POA: Diagnosis not present

## 2015-11-04 ENCOUNTER — Encounter: Payer: Self-pay | Admitting: *Deleted

## 2015-11-12 ENCOUNTER — Other Ambulatory Visit: Payer: Self-pay | Admitting: Family Medicine

## 2015-11-12 NOTE — Telephone Encounter (Signed)
Ok to refill 

## 2015-11-12 NOTE — Telephone Encounter (Signed)
plz phone in. 

## 2015-11-13 NOTE — Telephone Encounter (Signed)
Prescription called to pharmacy.

## 2015-11-24 ENCOUNTER — Other Ambulatory Visit: Payer: Self-pay | Admitting: *Deleted

## 2015-11-25 ENCOUNTER — Telehealth: Payer: Self-pay

## 2015-11-25 NOTE — Telephone Encounter (Signed)
It looks like she was on this when she est with Dr Darnell Level (prev saw Dr Graylin Shiver this is indeed the case -she has not had a reaction and can remain on it  I will cc her PCP

## 2015-11-25 NOTE — Telephone Encounter (Signed)
Elizabeth Long with [facility] left v/m; received rx for microzide 12.5 mg. [facility] has not filled before and pt is profiled with sensitivity to sulfa drugs. Elizabeth Long request cb. Ref # [id number]. Per 10/23/15 office note where pt established care; HTN had been controlled by HCTZ 12.5 mg.Please advise.

## 2015-11-26 DIAGNOSIS — D3132 Benign neoplasm of left choroid: Secondary | ICD-10-CM | POA: Diagnosis not present

## 2015-11-26 NOTE — Telephone Encounter (Signed)
I agree - should be able to take as was on this previously. Would have pharmacy verify with patient when she picks up.

## 2015-11-26 NOTE — Telephone Encounter (Signed)
[facility] that patient has been on this for quite awhile with no reaction.

## 2015-12-07 ENCOUNTER — Other Ambulatory Visit: Payer: Self-pay | Admitting: *Deleted

## 2015-12-07 ENCOUNTER — Encounter: Payer: Self-pay | Admitting: Family Medicine

## 2015-12-07 MED ORDER — MELOXICAM 7.5 MG PO TABS: 7.5000 mg | ORAL_TABLET | Freq: Every day | ORAL | Status: DC

## 2015-12-08 MED ORDER — MELOXICAM 7.5 MG PO TABS: 7.5000 mg | ORAL_TABLET | Freq: Every day | ORAL | Status: DC

## 2016-01-06 ENCOUNTER — Other Ambulatory Visit: Payer: Self-pay | Admitting: *Deleted

## 2016-01-06 MED ORDER — OMEPRAZOLE 20 MG PO CPDR: 20.0000 mg | DELAYED_RELEASE_CAPSULE | Freq: Every day | ORAL | 2 refills | Status: DC

## 2016-01-14 ENCOUNTER — Other Ambulatory Visit: Payer: Self-pay | Admitting: *Deleted

## 2016-01-14 MED ORDER — OMEPRAZOLE 20 MG PO CPDR: 20.0000 mg | DELAYED_RELEASE_CAPSULE | Freq: Every day | ORAL | 2 refills | Status: DC

## 2016-02-07 ENCOUNTER — Other Ambulatory Visit: Payer: Self-pay | Admitting: Family Medicine

## 2016-02-07 DIAGNOSIS — K52839 Microscopic colitis, unspecified: Secondary | ICD-10-CM

## 2016-02-07 DIAGNOSIS — I10 Essential (primary) hypertension: Secondary | ICD-10-CM

## 2016-02-07 DIAGNOSIS — Z1159 Encounter for screening for other viral diseases: Secondary | ICD-10-CM

## 2016-02-07 DIAGNOSIS — F1021 Alcohol dependence, in remission: Secondary | ICD-10-CM

## 2016-02-07 DIAGNOSIS — R7989 Other specified abnormal findings of blood chemistry: Secondary | ICD-10-CM

## 2016-02-10 ENCOUNTER — Other Ambulatory Visit (INDEPENDENT_AMBULATORY_CARE_PROVIDER_SITE_OTHER): Payer: BLUE CROSS/BLUE SHIELD

## 2016-02-10 DIAGNOSIS — F1021 Alcohol dependence, in remission: Secondary | ICD-10-CM | POA: Diagnosis not present

## 2016-02-10 DIAGNOSIS — Z1159 Encounter for screening for other viral diseases: Secondary | ICD-10-CM

## 2016-02-10 DIAGNOSIS — I10 Essential (primary) hypertension: Secondary | ICD-10-CM

## 2016-02-10 DIAGNOSIS — R5383 Other fatigue: Secondary | ICD-10-CM

## 2016-02-10 DIAGNOSIS — R7989 Other specified abnormal findings of blood chemistry: Secondary | ICD-10-CM | POA: Diagnosis not present

## 2016-02-10 DIAGNOSIS — K52839 Microscopic colitis, unspecified: Secondary | ICD-10-CM

## 2016-02-10 LAB — LIPID PANEL
CHOLESTEROL: 235 mg/dL — AB (ref 0–200)
HDL: 91.2 mg/dL (ref >39.00)
LDL CALC: 120 mg/dL — AB (ref 0–99)
NonHDL: 143.34
TRIGLYCERIDES: 117 mg/dL (ref 0.0–149.0)
Total CHOL/HDL Ratio: 3
VLDL: 23.4 mg/dL (ref 0.0–40.0)

## 2016-02-10 LAB — CBC WITH DIFFERENTIAL/PLATELET
BASOS ABS: 0 10*3/uL (ref 0.0–0.1)
Basophils Relative: 0.6 % (ref 0.0–3.0)
Eosinophils Absolute: 0.1 10*3/uL (ref 0.0–0.7)
Eosinophils Relative: 2.7 % (ref 0.0–5.0)
HEMATOCRIT: 41.1 % (ref 36.0–46.0)
Hemoglobin: 14 g/dL (ref 12.0–15.0)
LYMPHS ABS: 2.3 10*3/uL (ref 0.7–4.0)
Lymphocytes Relative: 42.5 % (ref 12.0–46.0)
MCHC: 34.2 g/dL (ref 30.0–36.0)
MCV: 88.8 fl (ref 78.0–100.0)
MONOS PCT: 7.4 % (ref 3.0–12.0)
Monocytes Absolute: 0.4 10*3/uL (ref 0.1–1.0)
NEUTROS PCT: 46.8 % (ref 43.0–77.0)
Neutro Abs: 2.6 10*3/uL (ref 1.4–7.7)
PLATELETS: 316 10*3/uL (ref 150.0–400.0)
RBC: 4.63 Mil/uL (ref 3.87–5.11)
RDW: 12.9 % (ref 11.5–15.5)
WBC: 5.5 10*3/uL (ref 4.0–10.5)

## 2016-02-10 LAB — COMPREHENSIVE METABOLIC PANEL
ALBUMIN: 4 g/dL (ref 3.5–5.2)
ALK PHOS: 51 U/L (ref 39–117)
ALT: 12 U/L (ref 0–35)
AST: 19 U/L (ref 0–37)
BUN: 8 mg/dL (ref 6–23)
CALCIUM: 9.2 mg/dL (ref 8.4–10.5)
CHLORIDE: 101 meq/L (ref 96–112)
CO2: 30 mEq/L (ref 19–32)
Creatinine, Ser: 0.83 mg/dL (ref 0.40–1.20)
GFR: 75.8 mL/min (ref >60.00)
Glucose, Bld: 97 mg/dL (ref 70–99)
POTASSIUM: 3.3 meq/L — AB (ref 3.5–5.1)
Sodium: 139 mEq/L (ref 135–145)
TOTAL PROTEIN: 7.4 g/dL (ref 6.0–8.3)
Total Bilirubin: 0.4 mg/dL (ref 0.2–1.2)

## 2016-02-10 LAB — MICROALBUMIN / CREATININE URINE RATIO
Creatinine,U: 96.1 mg/dL
MICROALB/CREAT RATIO: 0.7 mg/g (ref 0.0–30.0)
Microalb, Ur: <0.7 mg/dL (ref 0.0–1.9)

## 2016-02-10 LAB — TSH: TSH: 2.45 u[IU]/mL (ref 0.35–4.50)

## 2016-02-11 LAB — HEPATITIS C ANTIBODY: HCV AB: NEGATIVE

## 2016-02-12 ENCOUNTER — Ambulatory Visit (INDEPENDENT_AMBULATORY_CARE_PROVIDER_SITE_OTHER): Payer: BLUE CROSS/BLUE SHIELD | Admitting: Family Medicine

## 2016-02-12 ENCOUNTER — Encounter: Payer: Self-pay | Admitting: Family Medicine

## 2016-02-12 VITALS — BP 134/76 | HR 72 | Temp 98.1°F | Ht 65.0 in | Wt 139.5 lb

## 2016-02-12 DIAGNOSIS — G8929 Other chronic pain: Secondary | ICD-10-CM

## 2016-02-12 DIAGNOSIS — M159 Polyosteoarthritis, unspecified: Secondary | ICD-10-CM

## 2016-02-12 DIAGNOSIS — F1021 Alcohol dependence, in remission: Secondary | ICD-10-CM

## 2016-02-12 DIAGNOSIS — Z0001 Encounter for general adult medical examination with abnormal findings: Secondary | ICD-10-CM | POA: Insufficient documentation

## 2016-02-12 DIAGNOSIS — M8949 Other hypertrophic osteoarthropathy, multiple sites: Secondary | ICD-10-CM

## 2016-02-12 DIAGNOSIS — Z87891 Personal history of nicotine dependence: Secondary | ICD-10-CM

## 2016-02-12 DIAGNOSIS — R6889 Other general symptoms and signs: Secondary | ICD-10-CM | POA: Diagnosis not present

## 2016-02-12 DIAGNOSIS — K219 Gastro-esophageal reflux disease without esophagitis: Secondary | ICD-10-CM

## 2016-02-12 DIAGNOSIS — R079 Chest pain, unspecified: Secondary | ICD-10-CM | POA: Insufficient documentation

## 2016-02-12 DIAGNOSIS — R7989 Other specified abnormal findings of blood chemistry: Secondary | ICD-10-CM

## 2016-02-12 DIAGNOSIS — K589 Irritable bowel syndrome without diarrhea: Secondary | ICD-10-CM | POA: Diagnosis not present

## 2016-02-12 DIAGNOSIS — F418 Other specified anxiety disorders: Secondary | ICD-10-CM

## 2016-02-12 DIAGNOSIS — Z0000 Encounter for general adult medical examination without abnormal findings: Secondary | ICD-10-CM | POA: Diagnosis not present

## 2016-02-12 DIAGNOSIS — R0782 Intercostal pain: Secondary | ICD-10-CM

## 2016-02-12 DIAGNOSIS — M150 Primary generalized (osteo)arthritis: Secondary | ICD-10-CM

## 2016-02-12 DIAGNOSIS — I10 Essential (primary) hypertension: Secondary | ICD-10-CM | POA: Diagnosis not present

## 2016-02-12 DIAGNOSIS — Z23 Encounter for immunization: Secondary | ICD-10-CM | POA: Diagnosis not present

## 2016-02-12 DIAGNOSIS — M545 Low back pain: Secondary | ICD-10-CM

## 2016-02-12 MED ORDER — VITAMIN D3 25 MCG (1000 UT) PO CAPS: 1.0000 | ORAL_CAPSULE | Freq: Every day | ORAL | Status: DC

## 2016-02-12 MED ORDER — CILIDINIUM-CHLORDIAZEPOXIDE 2.5-5 MG PO CAPS: 1.0000 | ORAL_CAPSULE | Freq: Every morning | ORAL | 3 refills | Status: DC

## 2016-02-12 NOTE — Patient Instructions (Addendum)
Tdap today Start potassium 99mg  over the counter daily. Start vitamin D 1000 units daily.  EKG today. Chest pain sounds more muscucloskeletal - treat with aleve, heating pad to chest wall. If worsening chest pain, please let us know.    Costochondritis Costochondritis, sometimes called Tietze syndrome, is a swelling and irritation (inflammation) of the tissue (cartilage) that connects your ribs with your breastbone (sternum). It causes pain in the chest and rib area. Costochondritis usually goes away on its own over time. It can take up to 6 weeks or longer to get better, especially if you are unable to limit your activities. CAUSES  Some cases of costochondritis have no known cause. Possible causes include:  Injury (trauma).  Exercise or activity such as lifting.  Severe coughing. SIGNS AND SYMPTOMS  Pain and tenderness in the chest and rib area.  Pain that gets worse when coughing or taking deep breaths.  Pain that gets worse with specific movements. DIAGNOSIS  Your health care provider will do a physical exam and ask about your symptoms. Chest X-rays or other tests may be done to rule out other problems. TREATMENT  Costochondritis usually goes away on its own over time. Your health care provider may prescribe medicine to help relieve pain. HOME CARE INSTRUCTIONS   Avoid exhausting physical activity. Try not to strain your ribs during normal activity. This would include any activities using chest, abdominal, and side muscles, especially if heavy weights are used.  Apply ice to the affected area for the first 2 days after the pain begins.  Put ice in a plastic bag.  Place a towel between your skin and the bag.  Leave the ice on for 20 minutes, 2-3 times a day.  Only take over-the-counter or prescription medicines as directed by your health care provider. SEEK MEDICAL CARE IF:  You have redness or swelling at the rib joints. These are signs of infection.  Your pain does  not go away despite rest or medicine. SEEK IMMEDIATE MEDICAL CARE IF:   Your pain increases or you are very uncomfortable.  You have shortness of breath or difficulty breathing.  You cough up blood.  You have worse chest pains, sweating, or vomiting.  You have a fever or persistent symptoms for more than 2-3 days.  You have a fever and your symptoms suddenly get worse. MAKE SURE YOU:   Understand these instructions.  Will watch your condition.  Will get help right away if you are not doing well or get worse.   This information is not intended to replace advice given to you by your health care provider. Make sure you discuss any questions you have with your health care provider.   Document Released: 03/02/2005 Document Revised: 03/13/2013 Document Reviewed: 12/25/2012 Elsevier Interactive Patient Education [employer].

## 2016-02-12 NOTE — Assessment & Plan Note (Signed)
Preventative protocols reviewed and updated unless pt declined. Discussed healthy diet and lifestyle.  

## 2016-02-12 NOTE — Assessment & Plan Note (Addendum)
Anticipate MSK costochondritis - treat with aleve, heating pad.  Baseline EKG today - NSR rate 60s, normal axis, intervals, no acute ST/T changes.

## 2016-02-12 NOTE — Progress Notes (Signed)
Pre visit review using our clinic review tool, if applicable. No additional management support is needed unless otherwise documented below in the visit note. 

## 2016-02-12 NOTE — Progress Notes (Signed)
BP 134/76   Pulse 72   Temp 98.1 F (36.7 C) (Oral)   Ht 5\' 5"  (1.651 m)   Wt 139 lb 8 oz (63.3 kg)   BMI 23.21 kg/m    CC: CPE Subjective:    Patient ID: Elizabeth Long, female    DOB: 19-Nov-1960, 55 y.o.   MRN: [id number]  HPI: Elizabeth Long is a 55 y.o. female presenting on 02/12/2016 for Annual Exam   Ongoing chest discomfort and dyspnea present at rest over last 1 month described as deep sharp pains (no pressure/tightness). Reproducible. Not exertional. fmhx CAD.   Preventative: COLONOSCOPY Date: 06/2011 TA, microscopic colitis, rpt 5 yrs Olevia Perches) Well woman with OBGYN Dr Stann Mainland last 01/2015, s/p hysterectomy (endometriosis). Ovaries remain. Seeing next week.  Mammogram yearly - dense breasts s/p 3D mammogram Flu shot at work Tetanus - unsure >10 yrs ago. Tdap today Seat belt use discussed Sunscreen use discussed. No changing moles on skin.  Smoking - quit 2001 Alcohol use - none - sober since 1994.  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 Interim medical history since our last visit reviewed. Allergies and medications reviewed and updated. Current Outpatient Prescriptions on File Prior to Visit  Medication Sig  . estradiol (ESTRACE) 0.1 MG/GM vaginal cream Place 1 Applicatorful vaginally 2 (two) times a week.  . estradiol (ESTRACE) 2 MG tablet Take 2 mg by mouth daily.  . hydrochlorothiazide (MICROZIDE) 12.5 MG capsule Take 1 capsule (12.5 mg total) by mouth daily.  Marland Kitchen LORazepam (ATIVAN) 2 MG tablet   . omeprazole (PRILOSEC) 20 MG capsule Take 1 capsule (20 mg total) by mouth daily. One tablet by mouth once daily  . traMADol (ULTRAM) 50 MG tablet Take 50 mg by mouth as needed.  . venlafaxine XR (EFFEXOR-XR) 75 MG 24 hr capsule Take 75 mg by  mouth daily.   No current facility-administered medications on file prior to visit.     Review of Systems  Constitutional: Negative for activity change, appetite change, chills, fatigue, fever and unexpected weight change.  HENT: Negative for hearing loss.   Eyes: Negative for visual disturbance.  Respiratory: Positive for shortness of breath. Negative for cough, chest tightness and wheezing.   Cardiovascular: Positive for chest pain. Negative for palpitations and leg swelling.  Gastrointestinal: Negative for abdominal distention, abdominal pain, blood in stool, constipation, diarrhea, nausea and vomiting.  Genitourinary: Negative for difficulty urinating and hematuria.  Musculoskeletal: Negative for arthralgias, myalgias and neck pain.  Skin: Negative for rash.  Neurological: Positive for headaches (h/o migraines - previously saw [facility]). Negative for dizziness, seizures and syncope.  Hematological: Negative for adenopathy. Does not bruise/bleed easily.  Psychiatric/Behavioral: Negative for dysphoric mood. The patient is nervous/anxious.    Per HPI unless specifically indicated in ROS section     Objective:    BP 134/76   Pulse 72   Temp 98.1 F (36.7 C) (Oral)   Ht 5\' 5"  (1.651 m)   Wt 139 lb 8 oz (63.3 kg)   BMI 23.21 kg/m   Wt Readings from Last 3 Encounters:  02/12/16 139 lb 8 oz (63.3 kg)  10/23/15 137 lb (62.1 kg)  08/17/11 124 lb 9.6 oz (56.5 kg)    Physical Exam  Constitutional: She is oriented to person, place, and time.  She appears well-developed and well-nourished. No distress.  HENT:  Head: Normocephalic and atraumatic.  Right Ear: Hearing, tympanic membrane, external ear and ear canal normal.  Left Ear: Hearing, tympanic membrane, external ear and ear canal normal.  Nose: Nose normal.  Mouth/Throat: Uvula is midline, oropharynx is clear and moist and mucous membranes are normal. No oropharyngeal exudate, posterior oropharyngeal edema or posterior  oropharyngeal erythema.  Eyes: Conjunctivae and EOM are normal. Pupils are equal, round, and reactive to light. No scleral icterus.  Neck: Normal range of motion. Neck supple. No thyromegaly present.  Cardiovascular: Normal rate, regular rhythm, normal heart sounds and intact distal pulses.   No murmur heard. Pulses:      Radial pulses are 2+ on the right side, and 2+ on the left side.  Pulmonary/Chest: Effort normal and breath sounds normal. No respiratory distress. She has no wheezes. She has no rales. She exhibits tenderness.    Reproducible pain to palpation at costochondral junction L>R  Abdominal: Soft. Bowel sounds are normal. She exhibits no distension and no mass. There is no tenderness. There is no rebound and no guarding.  Musculoskeletal: Normal range of motion. She exhibits no edema.  Lymphadenopathy:    She has no cervical adenopathy.  Neurological: She is alert and oriented to person, place, and time.  CN grossly intact, station and gait intact  Skin: Skin is warm and dry. No rash noted.  Psychiatric: She has a normal mood and affect. Her behavior is normal. Judgment and thought content normal.  Nursing note and vitals reviewed.  Results for orders placed or performed in visit on 02/10/16  TSH  Result Value Ref Range   TSH 2.45 0.35 - 4.50 uIU/mL   Lab Results  Component Value Date   CHOL 235 (H) 02/10/2016   HDL 91.20 02/10/2016   LDLCALC 120 (H) 02/10/2016   TRIG 117.0 02/10/2016   CHOLHDL 3 02/10/2016    Lab Results  Component Value Date   WBC 5.5 02/10/2016   HGB 14.0 02/10/2016   HCT 41.1 02/10/2016   MCV 88.8 02/10/2016   PLT 316.0 02/10/2016    Lab Results  Component Value Date   CREATININE 0.83 02/10/2016    Lab Results  Component Value Date   K 3.3 (L) 02/10/2016      Assessment & Plan:   Problem List Items Addressed This Visit    Anxiety associated with depression    Managed by OBGYN.      Chest pain    Anticipate MSK costochondritis  - treat with aleve, heating pad.  Baseline EKG today - NSR rate 60s, normal axis, intervals, no acute ST/T changes.      Relevant Orders   EKG 12-Lead (Completed)   Chronic lower back pain    Pt has stopped meloxicam and abd swelling has improved, continues aleve 1 tablet daily.       Relevant Medications   naproxen sodium (ANAPROX) 220 MG tablet   Encounter for routine adult medical exam with abnormal findings - Primary    Preventative protocols reviewed and updated unless pt declined. Discussed healthy diet and lifestyle.       Essential hypertension    Chronic, stable. Continue hctz. Start OTC potassium daily.       Ex-smoker    Quit 2001      GERD (gastroesophageal reflux disease)    Continue omeprazole 20mg  daily.       Relevant Medications   clidinium-chlordiazePOXIDE (LIBRAX) 5-2.5 MG capsule   High  serum high density lipoprotein (HDL)    Chronic hyperlipidemia - predominantly high HDL.  fmhx CAD. ASCVD 11yr risk = 2.2%.       History of alcoholism ([place])   Irritable bowel syndrome (IBS)    Refill librax 3 mo to mail order.       Relevant Medications   clidinium-chlordiazePOXIDE (LIBRAX) 5-2.5 MG capsule   Osteoarthritis    Pt has stopped daily meloxicam and notes improvement in GI distress. Taking aleve 220mg  daily.       Relevant Medications   naproxen sodium (ANAPROX) 220 MG tablet    Other Visit Diagnoses    Need for Tdap vaccination       Relevant Orders   Tdap vaccine greater than or equal to 7yo IM (Completed)       Follow up plan: Return in about 3 months (around 05/13/2016), or if symptoms worsen or fail to improve, for follow up visit.  Ria Bush, MD

## 2016-02-12 NOTE — Assessment & Plan Note (Addendum)
Refill librax 3 mo to mail order.

## 2016-02-12 NOTE — Assessment & Plan Note (Signed)
Pt has stopped meloxicam and abd swelling has improved, continues aleve 1 tablet daily.

## 2016-02-12 NOTE — Assessment & Plan Note (Signed)
Chronic, stable. Continue hctz. Start OTC potassium daily.

## 2016-02-12 NOTE — Assessment & Plan Note (Signed)
-  Continue omeprazole 20 mg daily

## 2016-02-13 DIAGNOSIS — Z87891 Personal history of nicotine dependence: Secondary | ICD-10-CM | POA: Insufficient documentation

## 2016-02-13 NOTE — Assessment & Plan Note (Signed)
Managed by OB/GYN 

## 2016-02-13 NOTE — Assessment & Plan Note (Signed)
Pt has stopped daily meloxicam and notes improvement in GI distress. Taking aleve 220mg  daily.

## 2016-02-13 NOTE — Assessment & Plan Note (Signed)
Chronic hyperlipidemia - predominantly high HDL.  fmhx CAD. ASCVD 3yr risk = 2.2%.

## 2016-02-13 NOTE — Assessment & Plan Note (Signed)
Quit 2001

## 2016-02-15 DIAGNOSIS — Z01419 Encounter for gynecological examination (general) (routine) without abnormal findings: Secondary | ICD-10-CM | POA: Diagnosis not present

## 2016-02-15 DIAGNOSIS — Z1231 Encounter for screening mammogram for malignant neoplasm of breast: Secondary | ICD-10-CM | POA: Diagnosis not present

## 2016-02-15 DIAGNOSIS — Z6822 Body mass index (BMI) 22.0-22.9, adult: Secondary | ICD-10-CM | POA: Diagnosis not present

## 2016-02-18 ENCOUNTER — Telehealth: Payer: Self-pay

## 2016-02-18 NOTE — Telephone Encounter (Signed)
[facility] left v/m; librax no longer has a generic available and the name brand is too expensive for pt. Request cb with substitute med. Ref # [id number].

## 2016-02-22 MED ORDER — CILIDINIUM-CHLORDIAZEPOXIDE 2.5-5 MG PO CAPS: 1.0000 | ORAL_CAPSULE | Freq: Every morning | ORAL | 1 refills | Status: DC

## 2016-02-22 NOTE — Telephone Encounter (Signed)
Generic Rx printed and in [facility]im[facility] box. plz call and verify this will do.

## 2016-02-23 ENCOUNTER — Other Ambulatory Visit: Payer: Self-pay | Admitting: Obstetrics & Gynecology

## 2016-02-23 DIAGNOSIS — R928 Other abnormal and inconclusive findings on diagnostic imaging of breast: Secondary | ICD-10-CM

## 2016-02-23 NOTE — Telephone Encounter (Signed)
Verbal refill given to pharmacist, Helene Kelp, at [facility]. Shredded the printed rx

## 2016-02-25 ENCOUNTER — Ambulatory Visit
Admission: RE | Admit: 2016-02-25 | Discharge: 2016-02-25 | Disposition: A | Discharge status: Home / Self Care | Payer: BLUE CROSS/BLUE SHIELD | Source: Ambulatory Visit | Attending: Obstetrics & Gynecology | Admitting: Obstetrics & Gynecology

## 2016-02-25 DIAGNOSIS — R928 Other abnormal and inconclusive findings on diagnostic imaging of breast: Secondary | ICD-10-CM

## 2016-02-25 DIAGNOSIS — N6012 Diffuse cystic mastopathy of left breast: Secondary | ICD-10-CM | POA: Diagnosis not present

## 2016-02-25 DIAGNOSIS — N63 Unspecified lump in breast: Secondary | ICD-10-CM | POA: Diagnosis not present

## 2016-02-27 ENCOUNTER — Encounter: Payer: Self-pay | Admitting: Family Medicine

## 2016-02-29 ENCOUNTER — Ambulatory Visit (INDEPENDENT_AMBULATORY_CARE_PROVIDER_SITE_OTHER): Payer: BLUE CROSS/BLUE SHIELD | Admitting: Primary Care

## 2016-02-29 ENCOUNTER — Encounter: Payer: Self-pay | Admitting: Primary Care

## 2016-02-29 ENCOUNTER — Telehealth: Payer: Self-pay | Admitting: Primary Care

## 2016-02-29 VITALS — BP 114/70 | HR 56 | Temp 97.9°F | Wt 134.0 lb

## 2016-02-29 DIAGNOSIS — R079 Chest pain, unspecified: Secondary | ICD-10-CM

## 2016-02-29 DIAGNOSIS — E876 Hypokalemia: Secondary | ICD-10-CM

## 2016-02-29 NOTE — Assessment & Plan Note (Addendum)
Intermittent for the past several months. No improvement in chest pain with doubled omeprazole dose. No respiratory symptoms. Mild tenderness to left anterior chest, no improvement with Aleve.  Likely anxiety related given stressors in personal life. Suspect she is wanting reassurance given FH of heart disease. Do not suspect acute cardiac event given recent, multiple, stable ECGs. Do not suspect PE given lack of SOB and normal oxygen saturation.  Given FH and persistency of chest pain without resolve despite conservative treatment, will send to cardiology for stress test and further evaluation. Will recheck potassium given level of 3.3 in early September.

## 2016-02-29 NOTE — Progress Notes (Signed)
Pre visit review using our clinic review tool, if applicable. No additional management support is needed unless otherwise documented below in the visit note. 

## 2016-02-29 NOTE — Patient Instructions (Signed)
You will be contacted regarding your referral to Cardiology.  Please let us know if you have not heard back within one week.   Please call [phone] if you develop chest pain with radiation of pain, nausea, shortness of breath, no improvement in pain with rest.  It was a pleasure meeting you!

## 2016-02-29 NOTE — Progress Notes (Signed)
Subjective:    Patient ID: Elizabeth Long, female    DOB: [birth year], 55 y.o.   MRN: [id number]  HPI  Elizabeth Long is a 55 year old female with a history of IBS and GERD who presents today with a chief complaint of chest pain. Her chest pain has been present intermittently for the past 6 months. She is under a lot of stress at home as she cares for her boyfriend who has Dementia and MS.   She recently experienced anterior left sided chest pain on 02/26/16. Her pain radiated down from the left chest into her generalized abdomen. She called EMS who completed an ECG which was unremarkable. Her pain improved so she did not go the hospital. She also had baseline ECG on 02/12/16 with PCP given complaints of chest pain. Her ECG was with normal axis, no acute ST/T changes. Her pain was determined to be MSK and was treated with conservative measures.  Her pain is currently located to the left anterior chest without radiation to her back or abdominal wall. She describes her pain as sharp/stabbing which is intermittent and occurs during rest and also with exertion. She also reports belching, soreness to her chest wall, and esophageal burning. She's doubled her omeprazole dose for the past 2-3 weeks without improvement. She's also taken Aleve nearly every day for her low back pain. Denies nausea, vomiting.   She has a family history of CAD and CHF in her mother, and heart disease in both grandparents.   Review of Systems  Constitutional: Negative for fever.  HENT: Negative for congestion and rhinorrhea.   Respiratory: Negative for cough and shortness of breath.   Cardiovascular: Positive for chest pain. Negative for palpitations and leg swelling.  Gastrointestinal: Negative for abdominal pain.       Mild esophageal burning and belching, no changes or increased symptoms from baseline.  Neurological: Negative for dizziness and headaches.  Psychiatric/Behavioral: The patient is nervous/anxious.          Past Medical History:  Diagnosis Date  . Allergic rhinitis   . Anxiety associated with depression   . Chronic headaches   . Essential hypertension 10/24/2015  . GERD (gastroesophageal reflux disease)   . History of alcoholism ([place])    18 and 1/2 years sober  . History of pneumonia    x2  . Irritable bowel syndrome (IBS)   . Microscopic colitis   . Osteoarthritis 2000s   chronic back and shoulder pain s/p surgery  . Post menopausal syndrome 10/24/2015  . Tubular adenoma of colon      Social History   Social History  . Marital status: Single    Spouse name: N/A  . Number of children: 0  . Years of education: N/A   Occupational History  . 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 Smoking status: Former Smoker    Quit date: 06/07/1999  . Smokeless tobacco: Never Used  . Alcohol use No     Comment: sober since 12/27/1992  . Drug use: No  . Sexual activity: Yes   Other Topics Concern  . 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 ABDOMINAL  HYSTERECTOMY  1990   endometriosis  . ANTERIOR CERVICAL DECOMP/DISCECTOMY FUSION  2006   C5/6 HNP ([place])  . APPENDECTOMY    . COLONOSCOPY  06/2011   TA, microscopic colitis, rpt 5 yrs ([place])  . LUMBAR DISC SURGERY  2002   [place]  . lumbar esi  2010  . NOSE SURGERY    . SHOULDER SURGERY    . TONSILLECTOMY      Family History  Problem Relation Age of Onset  . Arthritis Mother   . CAD Mother 22    4v bypass  . Arthritis Father   . Hypertension Father   . Alcohol abuse Brother   . Heart disease Maternal Grandmother     CHF  . Cancer Maternal Grandfather     lung (smoker)  . Alcohol abuse Paternal Grandmother   . Hyperlipidemia Paternal Grandmother   .  CAD Paternal Grandmother     MI x3  . Stroke Paternal Grandmother   . Hypertension Paternal Grandmother   . Alcohol abuse Paternal Grandfather   . Arthritis Paternal Grandfather   . Heart disease Paternal Grandfather   . Stroke Paternal Grandfather   . Diabetes Paternal Grandfather   . Cancer Maternal Aunt     throat  . CAD Paternal Uncle     MI    Allergies  Allergen Reactions  . Cefotan [Cefotetan Disodium]   . Sulfur     Current Outpatient Prescriptions on File Prior to Visit  Medication Sig Dispense Refill  . Cholecalciferol (VITAMIN D3) 1000 units CAPS Take 1 capsule (1,000 Units total) by mouth daily. 30 capsule   . clidinium-chlordiazePOXIDE (LIBRAX) 5-2.5 MG capsule Take 1 capsule by mouth every morning. 90 capsule 1  . estradiol (ESTRACE) 0.1 MG/GM vaginal cream Place 1 Applicatorful vaginally 2 (two) times a week.    . estradiol (ESTRACE) 2 MG tablet Take 2 mg by mouth daily.  3  . hydrochlorothiazide (MICROZIDE) 12.5 MG capsule Take 1 capsule (12.5 mg total) by mouth daily. 90 capsule 3  . LORazepam (ATIVAN) 2 MG tablet     . naproxen sodium (ANAPROX) 220 MG tablet Take 220 mg by mouth daily as needed.    Marland Kitchen omeprazole (PRILOSEC) 20 MG capsule Take 1 capsule (20 mg total) by mouth daily. One tablet by mouth once daily 90 capsule 2  . Potassium 99 MG TABS Take 1 tablet (99 mg total) by mouth daily.    . traMADol (ULTRAM) 50 MG tablet Take 50 mg by mouth as needed.    . venlafaxine XR (EFFEXOR-XR) 75 MG 24 hr capsule Take 75 mg by mouth daily.     No current facility-administered medications on file prior to visit.     BP 114/70 (BP Location: Left Arm, Patient Position: Sitting, Cuff Size: Normal)   Pulse (!) 56   Temp 97.9 F (36.6 C) (Oral)   Wt 134 lb (60.8 kg)   SpO2 99%   BMI 22.30 kg/m    Objective:   Physical Exam  Constitutional: She appears well-nourished.  Neck: Neck supple.  Cardiovascular: Normal rate and regular rhythm.   Pulmonary/Chest:  Effort normal and breath sounds normal. She has no wheezes.  Mild tenderness to left anterior chest wall.  Abdominal: Soft. Bowel sounds are normal. There is no tenderness.  Skin: Skin is warm and dry.  Psychiatric: She has a normal mood and affect.          Assessment & Plan:

## 2016-02-29 NOTE — Telephone Encounter (Signed)
Please notify patient that I would like to repeat her potassium level since it was low last visit. Please schedule her for a lab only visit at her convenience.

## 2016-03-02 ENCOUNTER — Encounter: Payer: Self-pay | Admitting: Primary Care

## 2016-03-02 NOTE — Telephone Encounter (Addendum)
Please clarify the dose of potassium listed on her medication bottle. She will need a repeat potassium no later than Tuesday week if she is already started taking her replacement potassium.

## 2016-03-02 NOTE — Telephone Encounter (Signed)
Patient returned Chan's call.  Patient can be reached at [phone].

## 2016-03-02 NOTE — Telephone Encounter (Signed)
Spoken and notified patient of Kate's comments. Patient verbalized understanding.  Lab appt on 03/24/2016. Patient wanted to wait a little while since she just start taking the potassium 99 mg.

## 2016-03-03 NOTE — Telephone Encounter (Signed)
Spoken to patient and she stated that she takes 99 mg tablet. Patient wanted to wait because she just started talking the potassium for a week. Notified patient Elizabeth Long would like to done by Tuesday of next week. Patient decline.

## 2016-03-03 NOTE — Telephone Encounter (Signed)
Message left for patient to return my call.  

## 2016-03-03 NOTE — Telephone Encounter (Signed)
Patient returned Chan's call.  Please call patient back at [phone].

## 2016-03-14 ENCOUNTER — Encounter: Payer: Self-pay | Admitting: Internal Medicine

## 2016-03-17 ENCOUNTER — Other Ambulatory Visit: Payer: Self-pay | Admitting: *Deleted

## 2016-03-23 ENCOUNTER — Encounter: Payer: Self-pay | Admitting: Family Medicine

## 2016-03-24 ENCOUNTER — Other Ambulatory Visit: Payer: BLUE CROSS/BLUE SHIELD

## 2016-03-25 ENCOUNTER — Other Ambulatory Visit (HOSPITAL_COMMUNITY)
Admission: RE | Admit: 2016-03-25 | Discharge: 2016-03-25 | Disposition: A | Discharge status: Home / Self Care | Payer: BLUE CROSS/BLUE SHIELD | Source: Ambulatory Visit | Attending: Internal Medicine | Admitting: Internal Medicine

## 2016-03-25 ENCOUNTER — Ambulatory Visit (INDEPENDENT_AMBULATORY_CARE_PROVIDER_SITE_OTHER): Payer: BLUE CROSS/BLUE SHIELD | Admitting: Internal Medicine

## 2016-03-25 VITALS — BP 144/94 | HR 68 | Ht 65.0 in | Wt 136.0 lb

## 2016-03-25 DIAGNOSIS — R0602 Shortness of breath: Secondary | ICD-10-CM | POA: Diagnosis not present

## 2016-03-25 DIAGNOSIS — R079 Chest pain, unspecified: Secondary | ICD-10-CM

## 2016-03-25 LAB — D-DIMER, QUANTITATIVE: D-Dimer, Quant: 0.36 ug/mL-FEU (ref 0.00–0.50)

## 2016-03-25 NOTE — Patient Instructions (Signed)
Medication Instructions:  The current medical regimen is effective;  continue present plan and medications.  Labwork: Please have blood work today (D-dimer).  Testing/Procedures: Your physician has recommended that you have a cardiopulmonary stress test (CPX). CPX testing is a non-invasive measurement of heart and lung function. It replaces a traditional treadmill stress test. This type of test provides a tremendous amount of information that relates not only to your present condition but also for future outcomes. This test combines measurements of you ventilation, respiratory gas exchange in the lungs, electrocardiogram (EKG), blood pressure and physical response before, during, and following an exercise protocol.  Follow-Up: Further follow up will be based on the results of the above testing.  Thank you for choosing [facility]!!

## 2016-03-25 NOTE — Progress Notes (Signed)
Cardiology Office Note   Date:  03/25/2016   ID:  Elizabeth Long, DOB [birth year], MRN [id number]  PCP:  Ria Bush, MD  Cardiologist:   Dorris Carnes, MD   Pt referred for CP     History of Present Illness: Elizabeth Long is a 55 y.o. female with a history of GERD and IBSA  Seen in primary care  Complained of CP Episode on 9.22  Chest to abdomen  EMS called EKG reported negative   Doubled omeprazole  No change    Pt on 922  Had busy day  OK  Laid down  HIt her  Felt like she dying  Did not feel like reflux   Had been doubling up on those meds  Had episode that night  None since  Cleans the house  Doesn't do anything    Pt gets SOB  SOB here in clinic  NO wheezing  Tight in throat      Outpatient Medications Prior to Visit  Medication Sig Dispense Refill  . Cholecalciferol (VITAMIN D3) 1000 units CAPS Take 1 capsule (1,000 Units total) by mouth daily. 30 capsule   . clidinium-chlordiazePOXIDE (LIBRAX) 5-2.5 MG capsule Take 1 capsule by mouth every morning. 90 capsule 1  . estradiol (ESTRACE) 0.1 MG/GM vaginal cream Place 1 Applicatorful vaginally 2 (two) times a week.    . estradiol (ESTRACE) 2 MG tablet Take 2 mg by mouth daily.  3  . hydrochlorothiazide (MICROZIDE) 12.5 MG capsule Take 1 capsule (12.5 mg total) by mouth daily. 90 capsule 3  . LORazepam (ATIVAN) 2 MG tablet     . naproxen sodium (ANAPROX) 220 MG tablet Take 220 mg by mouth daily as needed.    Marland Kitchen omeprazole (PRILOSEC) 20 MG capsule Take 1 capsule (20 mg total) by mouth daily. One tablet by mouth once daily 90 capsule 2  . Potassium 99 MG TABS Take 1 tablet (99 mg total) by mouth daily.    . traMADol (ULTRAM) 50 MG tablet Take 50 mg by mouth as needed.    . venlafaxine XR (EFFEXOR-XR) 75 MG 24 hr capsule Take 75 mg by mouth daily.     No facility-administered medications prior to visit.      Allergies:   Cefotan [cefotetan disodium] and Sulfur   Past Medical History:  Diagnosis Date  . Allergic  rhinitis   . Anxiety associated with depression   . Chronic headaches   . Essential hypertension 10/24/2015  . GERD (gastroesophageal reflux disease)   . History of alcoholism ([place])    18 and 1/2 years sober  . History of pneumonia    x2  . Irritable bowel syndrome (IBS)   . Microscopic colitis   . Osteoarthritis 2000s   chronic back and shoulder pain s/p surgery  . Post menopausal syndrome 10/24/2015  . Tubular adenoma of colon     Past Surgical History:  Procedure Laterality Date  . ABDOMINAL HYSTERECTOMY  1990   endometriosis  . ANTERIOR CERVICAL DECOMP/DISCECTOMY FUSION  2006   C5/6 HNP ([place])  . APPENDECTOMY    . COLONOSCOPY  06/2011   TA, microscopic colitis, rpt 5 yrs ([place])  . LUMBAR DISC SURGERY  2002   [place]  . lumbar esi  2010  . NOSE SURGERY    . SHOULDER SURGERY    . TONSILLECTOMY       Social History:  The patient  reports that she quit smoking about 16 years ago. She has never used smokeless  tobacco. She reports that she does not drink alcohol or use drugs.   Family History:  The patient's family history includes Alcohol abuse in her brother, paternal grandfather, and paternal grandmother; Arthritis in her father, mother, and paternal grandfather; CAD in her paternal grandmother and paternal uncle; CAD (age of onset: 23) in her mother; Cancer in her maternal aunt and maternal grandfather; Diabetes in her paternal grandfather; Heart disease in her maternal grandmother and paternal grandfather; Hyperlipidemia in her paternal grandmother; Hypertension in her father and paternal grandmother; Stroke in her paternal grandfather, paternal grandmother, and paternal uncle.    ROS:  Please see the history of present illness. All other systems are reviewed and  Negative to the above problem except as noted.    PHYSICAL EXAM: VS:  BP (!) 144/94   Pulse 68   Ht 5\' 5"  (1.651 m)   Wt 136 lb (61.7 kg)   BMI 22.63 kg/m   GEN: Well nourished, well developed, in no  acute distress  HEENT: normal  Neck: no JVD, carotid bruits, or masses Cardiac: RRR; no murmurs, rubs, or gallops,no edema  Respiratory:  clear to auscultation bilaterally, normal work of breathing GI: soft, nontender, nondistended, + BS  No hepatomegaly  MS: no deformity Moving all extremities   Skin: warm and dry, no rash Neuro:  Strength and sensation are intact Psych: euthymic mood, full affect   EKG:  EKG is ordered today.  On 9.8 NSR    Lipid Panel    Component Value Date/Time   CHOL 235 (H) 02/10/2016 0739   CHOL 255 10/24/2012   TRIG 117.0 02/10/2016 0739   TRIG 93 10/24/2012   HDL 91.20 02/10/2016 0739   CHOLHDL 3 02/10/2016 0739   VLDL 23.4 02/10/2016 0739   LDLCALC 120 (H) 02/10/2016 0739   LDLCALC 149 10/24/2012      Wt Readings from Last 3 Encounters:  03/25/16 136 lb (61.7 kg)  02/29/16 134 lb (60.8 kg)  02/12/16 139 lb 8 oz (63.3 kg)      ASSESSMENT AND PLAN:  1  CP  Two episodes  Atypical  None since  2.  SOB  Concerning  No wheezing on exam  ? eitiology  WOuld check d dimer  Would set up for CPX test to evaluate      Current medicines are reviewed at length with the patient today.  The patient does not have concerns regarding medicines.  Signed, Dorris Carnes, MD  03/25/2016 11:22 AM    [facility] [address], [place], [place]  [address] Phone: [phone]; Fax: [phone]

## 2016-04-12 ENCOUNTER — Encounter (HOSPITAL_COMMUNITY): Payer: Self-pay | Admitting: *Deleted

## 2016-04-12 ENCOUNTER — Ambulatory Visit (HOSPITAL_COMMUNITY): Payer: BLUE CROSS/BLUE SHIELD | Attending: Internal Medicine

## 2016-04-12 DIAGNOSIS — R0602 Shortness of breath: Secondary | ICD-10-CM | POA: Insufficient documentation

## 2016-04-12 DIAGNOSIS — R079 Chest pain, unspecified: Secondary | ICD-10-CM

## 2016-04-13 DIAGNOSIS — R079 Chest pain, unspecified: Secondary | ICD-10-CM | POA: Diagnosis not present

## 2016-04-22 ENCOUNTER — Telehealth: Payer: Self-pay | Admitting: Internal Medicine

## 2016-04-22 NOTE — Telephone Encounter (Signed)
Patient had CPX on 04/13/16.  It does not look like it has been reviewed by Dr. Haroldine Laws yet.   Informed patient that results are not available yet.  Staff message sent to Dr. Haroldine Laws to ask him to read study.

## 2016-04-22 NOTE — Telephone Encounter (Signed)
Follow Up:    Pt would like her test results from 04-12-16 please.

## 2016-04-25 ENCOUNTER — Encounter: Payer: Self-pay | Admitting: Gastroenterology

## 2016-04-27 ENCOUNTER — Telehealth: Payer: Self-pay | Admitting: Internal Medicine

## 2016-04-27 DIAGNOSIS — R0600 Dyspnea, unspecified: Secondary | ICD-10-CM

## 2016-04-27 NOTE — Telephone Encounter (Signed)
-----   Message from Fay Records, MD sent at 04/25/2016  9:02 PM EST ----- Breathing loops are normal with exercise  No astham.   Some degree of hyperventilation notied Would recomm an echo to look at relaxing parameters / pulm pressures

## 2016-04-27 NOTE — Telephone Encounter (Signed)
Copied from CPX resutls:  Notes Recorded by Fay Records, MD on 04/25/2016 at 9:02 PM EST Breathing loops are normal with exercise No astham.  Some degree of hyperventilation notied Would recomm an echo to look at relaxing parameters / pulm pressures           Patient is informed and aware someone will be calling to schedule her echo.

## 2016-04-27 NOTE — Telephone Encounter (Signed)
New message  ° ° °Patient calling for test results.   °

## 2016-05-11 ENCOUNTER — Ambulatory Visit (HOSPITAL_COMMUNITY): Payer: BLUE CROSS/BLUE SHIELD | Attending: Cardiology

## 2016-05-11 ENCOUNTER — Other Ambulatory Visit: Payer: Self-pay

## 2016-05-11 DIAGNOSIS — Z87891 Personal history of nicotine dependence: Secondary | ICD-10-CM | POA: Insufficient documentation

## 2016-05-11 DIAGNOSIS — I10 Essential (primary) hypertension: Secondary | ICD-10-CM | POA: Insufficient documentation

## 2016-05-11 DIAGNOSIS — R0600 Dyspnea, unspecified: Secondary | ICD-10-CM | POA: Insufficient documentation

## 2016-05-25 ENCOUNTER — Telehealth: Payer: Self-pay | Admitting: Internal Medicine

## 2016-05-25 NOTE — Telephone Encounter (Signed)
Advised patient that I have not received results of echo from Dr. Harrington Challenger at this time.  She said she is fine and was just wondering because her mom needs knee replacement surgery and wanted to wait to schedule until patient knows what's going on with her.  Advised we will call her when Dr. Harrington Challenger has finished reviewing.

## 2016-05-25 NOTE — Telephone Encounter (Signed)
Patient is calling for her Echo results from 05/11/16

## 2016-05-26 NOTE — Telephone Encounter (Signed)
Patient informed and verbalized understanding

## 2016-05-26 NOTE — Telephone Encounter (Signed)
New Message   Patient calling to follow up with Dr. Harrington Challenger pertaining to office visit on October 20. Wants to know if she needs to make any further appointments.

## 2016-05-28 ENCOUNTER — Encounter: Payer: Self-pay | Admitting: Family Medicine

## 2016-05-28 DIAGNOSIS — R0600 Dyspnea, unspecified: Secondary | ICD-10-CM | POA: Insufficient documentation

## 2016-06-09 ENCOUNTER — Ambulatory Visit (INDEPENDENT_AMBULATORY_CARE_PROVIDER_SITE_OTHER): Payer: BLUE CROSS/BLUE SHIELD | Admitting: Family Medicine

## 2016-06-09 ENCOUNTER — Encounter: Payer: Self-pay | Admitting: Family Medicine

## 2016-06-09 VITALS — BP 146/82 | HR 64 | Temp 97.6°F | Wt 142.0 lb

## 2016-06-09 DIAGNOSIS — R0602 Shortness of breath: Secondary | ICD-10-CM

## 2016-06-09 DIAGNOSIS — I10 Essential (primary) hypertension: Secondary | ICD-10-CM

## 2016-06-09 DIAGNOSIS — F418 Other specified anxiety disorders: Secondary | ICD-10-CM | POA: Diagnosis not present

## 2016-06-09 MED ORDER — VENLAFAXINE HCL ER 150 MG PO CP24: 150.0000 mg | ORAL_CAPSULE | Freq: Every day | ORAL | 6 refills | Status: DC

## 2016-06-09 MED ORDER — HYDROXYZINE HCL 50 MG PO TABS: 50.0000 mg | ORAL_TABLET | Freq: Every evening | ORAL | 6 refills | Status: DC | PRN

## 2016-06-09 NOTE — Assessment & Plan Note (Signed)
Chronic, mildly elevated today. Will need to closely watch effect of effexor on blood pressure.

## 2016-06-09 NOTE — Assessment & Plan Note (Signed)
Anticipate dyspnea a manifestation of anxiety. Reviewed with patient as well as discussed stress relieving strategies.  Will increase effexor XR to 150mg  daily, ok to continue hydroxyzine which is helping, discussed decreased lorazepam use if taking hydroxyzine.  Discussed tramadol and effexor interaction. RTC 1-2 mo f/u visit. Consider counseling.   PHQ9 = 21/27, very difficult to function GAD7 = 12/21

## 2016-06-09 NOTE — Progress Notes (Signed)
Pre visit review using our clinic review tool, if applicable. No additional management support is needed unless otherwise documented below in the visit note. 

## 2016-06-09 NOTE — Assessment & Plan Note (Signed)
Thought anxiety related given recent normal testing.

## 2016-06-09 NOTE — Progress Notes (Signed)
BP (!) 146/82   Pulse 64   Temp 97.6 F (36.4 C) (Oral)   Wt 142 lb (64.4 kg)   BMI 23.63 kg/m    CC: f/u from cardiology Subjective:    Patient ID: Elizabeth Long, female    DOB: 1960-10-09, 56 y.o.   MRN: [id number]  HPI: Elizabeth Long is a 57 y.o. female presenting on 06/09/2016 for Follow-up (Dr. Harrington Challenger)   Recent reassuring cardiopulmonary stress test checked for 2 isolated episodes of chest pain - no asthma, mild hyperventilation. F/u echocardiogram was normal as well. Seen by Dr Harrington Challenger.   Known GERD, IBS. fmhx CAD - worried because mother had 4v bypass at 85yo after unrevealing cardiac evaluation. Noticing increased anxiety over the past year. She takes effexor XR 75mg  daily as well as lorazepam 2mg  nightly over last 3 yrs.   She finds she has ongoing dyspnea - related to stress levels. Today she even endorses some ongoing dyspnea - attributes to stressful morning working with her boss. She has tried breathing in paper bag. Tried friend's hydroxyzine 50mg  which has helped - taking nightly over last 2 wks. She also started magnesium.   She broke up with BF with MS - this has improved her stress levels.   Relevant past medical, surgical, family and social history reviewed and updated as indicated. Interim medical history since our last visit reviewed. Allergies and medications reviewed and updated. Current Outpatient Prescriptions on File Prior to Visit  Medication Sig  . Cholecalciferol (VITAMIN D3) 1000 units CAPS Take 1 capsule (1,000 Units total) by mouth daily.  . clidinium-chlordiazePOXIDE (LIBRAX) 5-2.5 MG capsule Take 1 capsule by mouth every morning.  Marland Kitchen estradiol (ESTRACE) 0.1 MG/GM vaginal cream Place 1 Applicatorful vaginally 2 (two) times a week.  . estradiol (ESTRACE) 2 MG tablet Take 2 mg by mouth daily.  . hydrochlorothiazide (MICROZIDE) 12.5 MG capsule Take 1 capsule (12.5 mg total) by mouth daily.  Marland Kitchen LORazepam (ATIVAN) 2 MG tablet   . naproxen sodium (ANAPROX) 220  MG tablet Take 220 mg by mouth daily as needed.  Marland Kitchen omeprazole (PRILOSEC) 20 MG capsule Take 1 capsule (20 mg total) by mouth daily. One tablet by mouth once daily  . Potassium 99 MG TABS Take 1 tablet (99 mg total) by mouth daily.  . traMADol (ULTRAM) 50 MG tablet Take 50 mg by mouth as needed.   No current facility-administered medications on file prior to visit.     Review of Systems Per HPI unless specifically indicated in ROS section     Objective:    BP (!) 146/82   Pulse 64   Temp 97.6 F (36.4 C) (Oral)   Wt 142 lb (64.4 kg)   BMI 23.63 kg/m   Wt Readings from Last 3 Encounters:  06/09/16 142 lb (64.4 kg)  03/25/16 136 lb (61.7 kg)  02/29/16 134 lb (60.8 kg)    Physical Exam  Constitutional: She appears well-developed and well-nourished. No distress.  HENT:  Head: Normocephalic and atraumatic.  Mouth/Throat: Oropharynx is clear and moist. No oropharyngeal exudate.  Eyes: Conjunctivae are normal. Pupils are equal, round, and reactive to light.  Cardiovascular: Normal rate, regular rhythm, normal heart sounds and intact distal pulses.   No murmur heard. Pulmonary/Chest: Effort normal and breath sounds normal. No respiratory distress. She has no wheezes. She has no rales.  Musculoskeletal: She exhibits no edema.  Skin: Skin is warm and dry. No rash noted.  Psychiatric: She has a normal mood and  affect.  Nursing note and vitals reviewed.     Assessment & Plan:   Problem List Items Addressed This Visit    Anxiety associated with depression - Primary    Anticipate dyspnea a manifestation of anxiety. Reviewed with patient as well as discussed stress relieving strategies.  Will increase effexor XR to 150mg  daily, ok to continue hydroxyzine which is helping, discussed decreased lorazepam use if taking hydroxyzine.  Discussed tramadol and effexor interaction. RTC 1-2 mo f/u visit. Consider counseling.   PHQ9 = 21/27, very difficult to function GAD7 = 12/21       Dyspnea    Thought anxiety related given recent normal testing.       Essential hypertension    Chronic, mildly elevated today. Will need to closely watch effect of effexor on blood pressure.           Follow up plan: Return in about 2 months (around 08/07/2016) for follow up visit.  Ria Bush, MD

## 2016-06-09 NOTE — Patient Instructions (Addendum)
Increase effexor XR to 150mg  daily. Ok to continue hydroxyzine 50mg  nightly but continue to back off lorazepam - take 1/2 tablet nightly as needed.  Anxiety questionairre today.  Watch blood pressure on higher effexor dose.  Return in 1-2 months for follow up visit. Good to see you today, call us with questions.

## 2016-07-04 ENCOUNTER — Encounter: Payer: Self-pay | Admitting: Family Medicine

## 2016-07-05 MED ORDER — OMEPRAZOLE 20 MG PO CPDR: 20.0000 mg | DELAYED_RELEASE_CAPSULE | Freq: Every day | ORAL | 1 refills | Status: DC

## 2016-07-11 MED ORDER — LORAZEPAM 2 MG PO TABS: 1.0000 mg | ORAL_TABLET | Freq: Every evening | ORAL | 0 refills | Status: DC | PRN

## 2016-07-11 NOTE — Telephone Encounter (Signed)
plz phone in lorazepam Rx

## 2016-07-12 NOTE — Telephone Encounter (Signed)
Rx called in as directed.   

## 2016-08-01 ENCOUNTER — Encounter: Payer: Self-pay | Admitting: Family Medicine

## 2016-08-01 ENCOUNTER — Ambulatory Visit (INDEPENDENT_AMBULATORY_CARE_PROVIDER_SITE_OTHER): Payer: BLUE CROSS/BLUE SHIELD | Admitting: Family Medicine

## 2016-08-01 VITALS — BP 116/84 | HR 72 | Temp 97.8°F | Wt 142.0 lb

## 2016-08-01 DIAGNOSIS — F418 Other specified anxiety disorders: Secondary | ICD-10-CM

## 2016-08-01 DIAGNOSIS — I10 Essential (primary) hypertension: Secondary | ICD-10-CM

## 2016-08-01 DIAGNOSIS — Z1211 Encounter for screening for malignant neoplasm of colon: Secondary | ICD-10-CM | POA: Diagnosis not present

## 2016-08-01 DIAGNOSIS — K219 Gastro-esophageal reflux disease without esophagitis: Secondary | ICD-10-CM | POA: Diagnosis not present

## 2016-08-01 DIAGNOSIS — F1021 Alcohol dependence, in remission: Secondary | ICD-10-CM | POA: Diagnosis not present

## 2016-08-01 DIAGNOSIS — K589 Irritable bowel syndrome without diarrhea: Secondary | ICD-10-CM | POA: Diagnosis not present

## 2016-08-01 DIAGNOSIS — Z23 Encounter for immunization: Secondary | ICD-10-CM

## 2016-08-01 DIAGNOSIS — K52839 Microscopic colitis, unspecified: Secondary | ICD-10-CM

## 2016-08-01 NOTE — Progress Notes (Signed)
BP 116/84 (BP Location: Left Arm, Patient Position: Sitting, Cuff Size: Normal)   Pulse 72   Temp 97.8 F (36.6 C) (Oral)   Wt 142 lb (64.4 kg)   BMI 23.63 kg/m    CC: 2 mo f/u visit Subjective:    Patient ID: Elizabeth Long, female    DOB: [birth year], 56 y.o.   MRN: [id number]  HPI: Elizabeth Long is a 56 y.o. female presenting on 08/01/2016 for Anxiety   See prior note for details. For increasing anxiety due to stressors, last visit we increased effexor XR to 150mg  daily and continued hydroxyzine 50mg  at bedtime. This has significantly improved her shortness of breath and chest pains.   She is trying to decrease lorazepam to 1/2 tab at bedtime with 2nd half as needed.   Also relaxing by using essential oils and downloaded new app that helps her relax.   More mindful, praying more.  She has visited [facility] and may sign up for yoga.  Walking 3 miles three times a week. Trying to eat healthier. Using lemon in water.   Requests referral to female GI provider - prior saw Dr Olevia Perches. Due for colonoscopy.  Noticing increasing belching, some bloating and abd cramping despite taking omeprazole 20mg  bid. Not food related as far as she knows. Librax helps. No fevers/chills, weight loss, nausea/vomiting, constipation. Isolated diarrheal episode. Some improvement with evacuation.   She did contact the flu this year a few weeks ago. Did not receive flu shot this year.   Relevant past medical, surgical, family and social history reviewed and updated as indicated. Interim medical history since our last visit reviewed. Allergies and medications reviewed and updated. Outpatient Medications Prior to Visit  Medication Sig Dispense Refill  . clidinium-chlordiazePOXIDE (LIBRAX) 5-2.5 MG capsule Take 1 capsule by mouth every morning. 90 capsule 1  . estradiol (ESTRACE) 2 MG tablet Take 2 mg by mouth daily.  3  . hydrochlorothiazide (MICROZIDE) 12.5 MG capsule Take 1 capsule (12.5 mg total) by mouth  daily. 90 capsule 3  . hydrOXYzine (ATARAX/VISTARIL) 50 MG tablet Take 1 tablet (50 mg total) by mouth at bedtime as needed. 30 tablet 6  . LORazepam (ATIVAN) 2 MG tablet Take 0.5 tablets (1 mg total) by mouth at bedtime as needed for anxiety. 20 tablet 0  . Magnesium 100 MG CAPS Take 100 mg by mouth daily.    Marland Kitchen omeprazole (PRILOSEC) 20 MG capsule Take 1 capsule (20 mg total) by mouth daily. One tablet by mouth once daily 90 capsule 1  . Potassium 99 MG TABS Take 1 tablet (99 mg total) by mouth daily.    Marland Kitchen pyridOXINE (VITAMIN B-6) 100 MG tablet Take 100 mg by mouth daily.    . traMADol (ULTRAM) 50 MG tablet Take 50 mg by mouth as needed.    . venlafaxine XR (EFFEXOR-XR) 150 MG 24 hr capsule Take 1 capsule (150 mg total) by mouth daily. 30 capsule 6  . naproxen sodium (ANAPROX) 220 MG tablet Take 220 mg by mouth daily as needed.    . Cholecalciferol (VITAMIN D3) 1000 units CAPS Take 1 capsule (1,000 Units total) by mouth daily. (Patient not taking: Reported on 08/01/2016) 30 capsule   . estradiol (ESTRACE) 0.1 MG/GM vaginal cream Place 1 Applicatorful vaginally 2 (two) times a week.     No facility-administered medications prior to visit.      Per HPI unless specifically indicated in ROS section below Review of Systems     Objective:  BP 116/84 (BP Location: Left Arm, Patient Position: Sitting, Cuff Size: Normal)   Pulse 72   Temp 97.8 F (36.6 C) (Oral)   Wt 142 lb (64.4 kg)   BMI 23.63 kg/m   Wt Readings from Last 3 Encounters:  08/01/16 142 lb (64.4 kg)  06/09/16 142 lb (64.4 kg)  03/25/16 136 lb (61.7 kg)    Physical Exam  Constitutional: She appears well-developed and well-nourished. No distress.  Psychiatric: She has a normal mood and affect. Her behavior is normal. Thought content normal.  Nursing note and vitals reviewed.     Assessment & Plan:   Problem List Items Addressed This Visit    Anxiety associated with depression - Primary    Marked improvement on higher  effexor dose.  Discussed hydroxyzine vs lorazepam use. Pt minimizing lorazepam - taking about 1/2 nightly.   Pt requests I take over lorazepam prescription as I'm managing effexor (prior Rx by OBGYN). I agreed to this, will need UDS and controlled substance agreement next visit.  PHQ9 = 21--> 5/27, not at all difficult to function GAD7 = 4/21      Essential hypertension    Chronic, stable despite increased effexor dose. Continue.       GERD (gastroesophageal reflux disease)    Continue omeprazole 20mg  - pt has increased to BID dosing.       History of alcoholism ([place])    Abstinent for 23+ yrs.       Irritable bowel syndrome (IBS)    Anticipate current flare related to IBS. Discussed librax use, recommended low FODMAP diet. Handout provided.       Microscopic colitis    H/o this. Doubt current sxs related. Regardless, will refer back to GI as due for colonoscopy.       Relevant Orders   Ambulatory referral to Gastroenterology    Other Visit Diagnoses    Special screening for malignant neoplasms, colon       Relevant Orders   Ambulatory referral to Gastroenterology       Follow up plan: Return in about 4 months (around 11/29/2016) for follow up visit.  Ria Bush, MD

## 2016-08-01 NOTE — Progress Notes (Signed)
Pre visit review using our clinic review tool, if applicable. No additional management support is needed unless otherwise documented below in the visit note. 

## 2016-08-01 NOTE — Assessment & Plan Note (Signed)
Anticipate current flare related to IBS. Discussed librax use, recommended low FODMAP diet. Handout provided.

## 2016-08-01 NOTE — Assessment & Plan Note (Addendum)
Marked improvement on higher effexor dose.  Discussed hydroxyzine vs lorazepam use. Pt minimizing lorazepam - taking about 1/2 nightly.   Pt requests I take over lorazepam prescription as I'm managing effexor (prior Rx by OBGYN). I agreed to this, will need UDS and controlled substance agreement next visit.  PHQ9 = 21--> 5/27, not at all difficult to function GAD7 = 4/21

## 2016-08-01 NOTE — Assessment & Plan Note (Addendum)
Abstinent for 23+ yrs.

## 2016-08-01 NOTE — Patient Instructions (Addendum)
Flu shot today.  Congratulations on healthy lifestyle changes to date! Continue effexor higher dose as well as hydroxyzine and lorazepam.  Lorazepam and hydroxyzine both do similar things.  We will refer you to Dr Silverio [facility] for repeat colonoscopy.   Look into FODMAP diet.   Good to see you today, call us with questions.

## 2016-08-01 NOTE — Assessment & Plan Note (Signed)
Chronic, stable despite increased effexor dose. Continue.

## 2016-08-01 NOTE — Addendum Note (Signed)
Addended by: Pilar Grammes on: 08/01/2016 10:08 AM   Modules accepted: Orders

## 2016-08-01 NOTE — Assessment & Plan Note (Signed)
H/o this. Doubt current sxs related. Regardless, will refer back to GI as due for colonoscopy.

## 2016-08-01 NOTE — Assessment & Plan Note (Signed)
Continue omeprazole 20mg  - pt has increased to BID dosing.

## 2016-08-02 ENCOUNTER — Encounter: Payer: Self-pay | Admitting: Gastroenterology

## 2016-08-02 ENCOUNTER — Ambulatory Visit: Payer: BLUE CROSS/BLUE SHIELD | Admitting: Family Medicine

## 2016-08-17 DIAGNOSIS — L905 Scar conditions and fibrosis of skin: Secondary | ICD-10-CM | POA: Diagnosis not present

## 2016-09-04 HISTORY — PX: COLONOSCOPY: SHX174

## 2016-09-19 ENCOUNTER — Telehealth: Payer: Self-pay | Admitting: Gastroenterology

## 2016-09-19 ENCOUNTER — Encounter: Payer: Self-pay | Admitting: Gastroenterology

## 2016-09-19 ENCOUNTER — Ambulatory Visit (INDEPENDENT_AMBULATORY_CARE_PROVIDER_SITE_OTHER): Payer: BLUE CROSS/BLUE SHIELD | Admitting: Gastroenterology

## 2016-09-19 VITALS — BP 130/72 | HR 89 | Ht 65.0 in | Wt 140.0 lb

## 2016-09-19 DIAGNOSIS — F4323 Adjustment disorder with mixed anxiety and depressed mood: Secondary | ICD-10-CM

## 2016-09-19 DIAGNOSIS — K219 Gastro-esophageal reflux disease without esophagitis: Secondary | ICD-10-CM

## 2016-09-19 DIAGNOSIS — E739 Lactose intolerance, unspecified: Secondary | ICD-10-CM

## 2016-09-19 DIAGNOSIS — R140 Abdominal distension (gaseous): Secondary | ICD-10-CM | POA: Diagnosis not present

## 2016-09-19 DIAGNOSIS — K581 Irritable bowel syndrome with constipation: Secondary | ICD-10-CM | POA: Diagnosis not present

## 2016-09-19 DIAGNOSIS — R1013 Epigastric pain: Secondary | ICD-10-CM | POA: Diagnosis not present

## 2016-09-19 DIAGNOSIS — Z86010 Personal history of colonic polyps: Secondary | ICD-10-CM | POA: Diagnosis not present

## 2016-09-19 MED ORDER — NA SULFATE-K SULFATE-MG SULF 17.5-3.13-1.6 GM/177ML PO SOLN: 1.0000 | Freq: Once | ORAL | 0 refills | Status: DC

## 2016-09-19 MED ORDER — LINACLOTIDE 72 MCG PO CAPS: 72.0000 ug | ORAL_CAPSULE | Freq: Every day | ORAL | 6 refills | Status: DC

## 2016-09-19 MED ORDER — NA SULFATE-K SULFATE-MG SULF 17.5-3.13-1.6 GM/177ML PO SOLN
1.0000 | Freq: Once | ORAL | 0 refills | Status: AC
Start: 2016-09-19 — End: 2016-09-19

## 2016-09-19 NOTE — Progress Notes (Signed)
Elizabeth Long    [id number]    [birth year]  Primary Care Physician:Javier Danise Mina, MD  Referring Physician: Ria Bush, MD [address] [place], [place] [address]  Chief complaint:  Epigastric abdominal pain, bloating, irritable bowel syndrome HPI:  56 year old female with history of irritable bowel syndrome, GERD, depression and anxiety disorder here with complaints of epigastric abdominal pain, excessive bloating and worsening of irritable bowel syndrome. She was last seen by Dr. Olevia Perches 5 years ago in March 2013. At that time she had severe diarrhea due to collagenous colitis, she was treated with prednisone taper.  Patient reports increased bloating after she eats, worse with cheese and dairy products. She is also having difficulty evacuating with fragmented bowel movements. She has to go 6-7 times to feel she's completely evacuated. She was passing hard stool, pellets somewhat improved since she started taking stool softener. Heartburn and GERD symptoms controlled with PPI. She is taking Librax as needed for abdominal cramps but continues to have intermittent cramps with bloating. Patient is requesting medication to suppress her appetite, she is stress eating and is gaining weight. Last colonoscopy January 2013: Sessile large carpeted polyp removed with snare piecemeal in ascending colon; tubular adenoma on pathology. Random colon biopsies positive for collagenous colitis    Outpatient Encounter Prescriptions as of 09/19/2016  Medication Sig  . clidinium-chlordiazePOXIDE (LIBRAX) 5-2.5 MG capsule Take 1 capsule by mouth every morning.  Marland Kitchen estradiol (ESTRACE) 2 MG tablet Take 2 mg by mouth daily.  . hydrochlorothiazide (MICROZIDE) 12.5 MG capsule Take 1 capsule (12.5 mg total) by mouth daily.  . hydrOXYzine (ATARAX/VISTARIL) 50 MG tablet Take 1 tablet (50 mg total) by mouth at bedtime as needed.  Marland Kitchen LORazepam (ATIVAN) 2 MG tablet Take 0.5 tablets (1 mg total) by  mouth at bedtime as needed for anxiety.  . Magnesium 100 MG CAPS Take 100 mg by mouth daily.  Marland Kitchen omeprazole (PRILOSEC) 20 MG capsule Take 1 capsule (20 mg total) by mouth daily. One tablet by mouth once daily  . Potassium 99 MG TABS Take 1 tablet (99 mg total) by mouth daily.  Marland Kitchen pyridOXINE (VITAMIN B-6) 100 MG tablet Take 100 mg by mouth daily.  . traMADol (ULTRAM) 50 MG tablet Take 50 mg by mouth as needed.  . venlafaxine XR (EFFEXOR-XR) 150 MG 24 hr capsule Take 1 capsule (150 mg total) by mouth daily.  . Cholecalciferol (VITAMIN D3) 1000 units CAPS Take 1 capsule (1,000 Units total) by mouth daily. (Patient not taking: Reported on 09/19/2016)   No facility-administered encounter medications on file as of 09/19/2016.     Allergies as of 09/19/2016 - Review Complete 09/19/2016  Allergen Reaction Noted  . Cefotan [cefotetan disodium]  06/21/2011  . Sulfur  06/21/2011    Past Medical History:  Diagnosis Date  . Allergic rhinitis   . Anxiety associated with depression   . Chronic headaches   . Essential hypertension 10/24/2015  . GERD (gastroesophageal reflux disease)   . History of alcoholism ([place])    18 and 1/2 years sober  . History of pneumonia    x2  . Irritable bowel syndrome (IBS)   . Microscopic colitis   . Osteoarthritis 2000s   chronic back and shoulder pain s/p surgery  . Post menopausal syndrome 10/24/2015  . Tubular adenoma of colon     Past Surgical History:  Procedure Laterality Date  . ABDOMINAL HYSTERECTOMY  1990   endometriosis  .  ANTERIOR CERVICAL DECOMP/DISCECTOMY FUSION  2006   C5/6 HNP ([facility])  . APPENDECTOMY    . COLONOSCOPY  06/2011   TA, microscopic colitis, rpt 5 yrs ([facility])  . LUMBAR DISC SURGERY  2002   [facility]  . lumbar esi  2010  . NOSE SURGERY    . SHOULDER SURGERY    . TONSILLECTOMY      Family History  Problem Relation Age of Onset  . Arthritis Mother   . CAD Mother 25    4v bypass  . Arthritis Father   . Hypertension Father   .  Alcohol abuse Brother   . Heart disease Maternal Grandmother     CHF  . Cancer Maternal Grandfather     lung (smoker)  . Alcohol abuse Paternal Grandmother   . Hyperlipidemia Paternal Grandmother   . CAD Paternal Grandmother     MI x3  . Stroke Paternal Grandmother   . Hypertension Paternal Grandmother   . Alcohol abuse Paternal Grandfather   . Arthritis Paternal Grandfather   . Heart disease Paternal Grandfather   . Stroke Paternal Grandfather   . Diabetes Paternal Grandfather   . Cancer Maternal Aunt     throat  . CAD Paternal Uncle     MI  . Stroke Paternal Uncle     Social History   Social History  . Marital status: Single    Spouse name: N/A  . Number of children: 0  . Years of education: N/A   Occupational History  . 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 Smoking status: Former Smoker    Quit date: 06/07/1999  . Smokeless tobacco: Never Used  . Alcohol use No     Comment: sober since 12/27/1992  . Drug use: No  . Sexual activity: Yes   Other Topics Concern  . Not on file   Social History Narrative   Daily caffeine    Lives alone   BF has MS   Occ: Air cabin crew at [employer]   Edu: HS   Activity: no regular exercise   Diet: good water, poor fruits/vegetables, sweets      Review of systems: Review of Systems  Constitutional: Negative for fever and chills.  positive for fatigue  HENT:  Positive for sinus problems Eyes: Negative for blurred vision.  Respiratory: Negative for cough, and wheezing.   positive for shortness of breath (negative cardiac and pulmonary workup ) Cardiovascular: Negative for chest pain and palpitations.  Gastrointestinal: as per HPI Genitourinary: Negative for dysuria, urgency, frequency and hematuria.  Musculoskeletal:  Positive for myalgias, back pain and joint pain.  Skin: Negative for itching and rash.  Neurological: Negative for dizziness, tremors, focal weakness, seizures and  loss of consciousness.  Endo/Heme/Allergies: Positive for seasonal allergies.  Psychiatric/Behavioral:  Positive for depression and anxiety ,  negative forsuicidal ideas and hallucinations.  All other systems reviewed and are negative.   Physical Exam: Vitals:   09/19/16 1017  BP: 130/72  Pulse: 89   Body mass index is 23.3 kg/m. Gen:      No acute distress HEENT:  EOMI, sclera anicteric Neck:     No masses; no thyromegaly Lungs:    Clear to auscultation bilaterally; normal respiratory effort CV:         Regular rate and rhythm; no murmurs Abd:      + bowel sounds; soft, non-tender; no palpable masses, no distension Ext:    No edema; adequate peripheral perfusion  Skin:      Warm and dry; no rash Neuro: alert and oriented x 3 Psych: normal mood and affect  Data Reviewed:  Reviewed labs, radiology imaging, old records and pertinent past GI work up   Assessment and Plan/Recommendations: 56 year old female with history of chronic depression, anxiety disorder, GERD, irritable bowel syndrome here with complaints of epigastric abdominal pain and bloating  Epigastric abdominal pain: Recent onset worsening, no improvement with PPI Will schedule for abdominal ultrasound to exclude gallbladder disease  Bloating and irritable bowel syndrome: Patient likely has lactose intolerance and is mainly contributing to excessive bloating and abdominal cramps Discussed lactose-free diet IB gard 1-2 capsules 3 times daily  Constipation also likely contributing to increased bloating: We'll start low-dose linzess 72 g daily, titrate up Dose if no response  GERD: Symptoms stable Continue PPI, Omeprazole 20mg  daily Follow antireflux measures  Colorectal cancer screening: Large sessile polyp, carpeted removed in piecemeal January 2013 High risk, due for surveillance colonoscopy in 3 years given the size of the polyp We will schedule for colonoscopy today The risks and benefits as well as  alternatives of endoscopic procedure(s) have been discussed and reviewed. All questions answered. The patient agrees to proceed.  Stress eating an excessive weight gain: Patient is currently being managed for depression and anxiety by PMD Dr Danise Mina. I informed patient that I'm not in favor of medication to suppress her appetite due to potential side effect related to medication and her BMI is within normal limits Advised her to continue daily exercise    K. Denzil Magnuson , MD [phone] Mon-Fri 8a-5p [phone] after 5p, weekends, holidays  CC: Ria Bush, MD

## 2016-09-19 NOTE — Patient Instructions (Signed)
You have been scheduled for an abdominal ultrasound at [facility] (1st floor of hospital) on 09/22/2016 at 9am. Please arrive 15 minutes prior to your appointment for registration. Make certain not to have anything to eat or drink 6 hours prior to your appointment. Should you need to reschedule your appointment, please contact radiology at [phone]. This test typically takes about 30 minutes to perform.  You have been scheduled for a colonoscopy. Please follow written instructions given to you at your visit today.  Please pick up your prep supplies at the pharmacy within the next 1-3 days. If you use inhalers (even only as needed), please bring them with you on the day of your procedure. Your physician has requested that you go to www.startemmi.com and enter the access code given to you at your visit today. This web site gives a general overview about your procedure. However, you should still follow specific instructions given to you by our office regarding your preparation for the procedure.  We will send Linzess to your pharmacy  Use IB Donald Prose over the counter 1-2 capsules three times a day as needed    Lactose-Free Diet, Adult If you have lactose intolerance, you are not able to digest lactose. Lactose is a natural sugar found mainly in milk and milk products. You may need to avoid all foods and beverages that contain lactose. A lactose-free diet can help you do this. What do I need to know about this diet?  Do not consume foods, beverages, vitamins, minerals, or medicines with lactose. Read ingredients lists carefully.  Look for the words "lactose-free" on labels.  Use lactase enzyme drops or tablets as directed by your health care provider.  Use lactose-free milk or a milk alternative, such as soy milk, for drinking and cooking.  Make sure you get enough calcium and vitamin D in your diet. A lactose-free eating plan can be lacking in these important nutrients.  Take  calcium and vitamin D supplements as directed by your health care provider. Talk to your provider about supplements if you are not able to get enough calcium and vitamin D from food. Which foods have lactose? Lactose is found in:  Milk and foods made from milk.  Yogurt.  Cheese.  Butter.  Margarine.  Sour cream.  Cream.  Whipped toppings and nondairy creamers.  Ice cream and other milk-based desserts. Lactose is also found in foods or products made with milk or milk ingredients. To find out whether a food contains milk or a milk ingredient, look at the ingredients list. Avoid foods with the statement "May contain milk" and foods that contain:  Butter.  Cream.  Milk.  Milk solids.  Milk powder.  Whey.  Curd.  Caseinate.  Lactose.  Lactalbumin.  Lactoglobulin. What are some alternatives to milk and foods made with milk products?  Lactose-free milk.  Soy milk with added calcium and vitamin D.  Almond, coconut, or rice milk with added calcium and vitamin D. Note that these are low in protein.  Soy products, such as soy yogurt, soy cheese, soy ice cream, and soy-based sour cream. Which foods can I eat? Grains  Breads and rolls made without milk, such as [place], [place], or [place] bread, bagels, pita, and [employer]. Corn tortillas, corn meal, grits, and polenta. Crackers without lactose or milk solids, such as soda crackers and graham crackers. Cooked or dry cereals without lactose or milk solids. Pasta, quinoa, couscous, barley, oats, bulgur, farro, rice, wild rice, or other grains prepared without  milk or lactose. Plain popcorn. Vegetables  Fresh, frozen, and canned vegetables without cheese, cream, or butter sauces. Fruits  All fresh, canned, frozen, or dried fruits that are not processed with lactose. Meats and Other Protein Sources  Plain beef, chicken, fish, [place], lamb, veal, pork, wild game, or ham. Kosher-prepared meat products. Strained or junior meats  that do not contain milk. Eggs. Soy meat substitutes. Beans, lentils, and hummus. Tofu. Nuts and seeds. Peanut or other nut butters without lactose. Soups, casseroles, and mixed dishes without cheese, cream, or milk. Dairy  Lactose-free milk. Soy, rice, or almond milk with added calcium and vitamin D. Soy cheese and yogurt. Beverages  Carbonated drinks. Tea. Coffee, freeze-dried coffee, and some instant coffees. Fruit and vegetable juices. Condiments  Soy sauce. Carob powder. Olives. Gravy made with water. Baker's cocoa. Angie Fava. Pure seasonings and spices. Ketchup. Mustard. Bouillon. Broth. Sweets and Desserts  Water and fruit ices. Gelatin. Cookies, pies, or cakes made from allowed ingredients, such as angel food cake. Pudding made with water or a milk substitute. Lactose-free tofu desserts. Soy, coconut milk, or rice-milk-based frozen desserts. Sugar. Honey. Jam, jelly, and marmalade. Molasses. Pure sugar candy. Dark chocolate without milk. Marshmallows. Fats and Oils  Margarines and salad dressings that do not contain milk. Berniece Salines. Vegetable oils. Shortening. Mayonnaise. Soy or coconut-based cream. The items listed above may not be a complete list of recommended foods or beverages. Contact your dietitian for more options.  Which foods are not recommended? Grains  Breads and rolls that contain milk. Toaster pastries. Muffins, biscuits, waffles, cornbread, and pancakes. These can be prepared at home, commercial, or from mixes. Sweet rolls, donuts, English muffins, fry bread, lefse, flour tortillas with lactose, or [place] toast made with milk or milk ingredients. Crackers that contain lactose. Corn curls. Cooked or dry cereals with lactose. Vegetables  Creamed or breaded vegetables. Vegetables in a cheese or butter sauce or with lactose-containing margarines. Instant potatoes. [place] fries. Scalloped or au gratin potatoes. Fruits  None. Meats and Other Protein Sources  Scrambled eggs, omelets,  and souffles that contain milk. Creamed or breaded meat, fish, chicken, or [place]. Sausage products, such as wieners and liver sausage. Cold cuts that contain milk solids. Cheese, cottage cheese, ricotta cheese, and cheese spreads. Lasagna and macaroni and cheese. Pizza. Peanut or other nut butters with added milk solids. Casseroles or mixed dishes containing milk or cheese. Dairy  All dairy products, including milk, goat's milk, buttermilk, kefir, acidophilus milk, flavored milk, evaporated milk, condensed milk, dulce de [place], eggnog, yogurt, cheese, and cheese spreads. Beverages  Hot chocolate. Cocoa with lactose. Instant iced teas. Powdered fruit drinks. Smoothies made with milk or yogurt. Condiments  Chewing gum that has lactose. Cocoa that has lactose. Spice blends if they contain milk products. Artificial sweeteners that contain lactose. Nondairy creamers. Sweets and Desserts  Ice cream, ice milk, gelato, sherbet, and frozen yogurt. Custard, pudding, and mousse. Cake, cream pies, cookies, and other desserts containing milk, cream, cream cheese, or milk chocolate. Pie crust made with milk-containing margarine or butter. Reduced-calorie desserts made with a sugar substitute that contains lactose. Toffee and butterscotch. Milk, white, or dark chocolate that contains milk. Fudge. Caramel. Fats and Oils  Margarines and salad dressings that contain milk or cheese. Cream. Half and half. Cream cheese. Sour cream. Chip dips made with sour cream or yogurt. The items listed above may not be a complete list of foods and beverages to avoid. Contact your dietitian for more information.  Am I getting  enough calcium? Calcium is found in many foods that contain lactose and is important for bone health. The amount of calcium you need depends on your age:  Adults younger than 50 years: 1000 mg of calcium a day.  Adults older than 50 years: 1200 mg of calcium a day. If you are not getting enough calcium, other  calcium sources include:  Orange juice with calcium added. There are 300-350 mg of calcium in 1 cup of orange juice.  Sardines with edible bones. There are 325 mg of calcium in 3 oz of sardines.  Calcium-fortified soy milk. There are 300-400 mg of calcium in 1 cup of calcium-fortified soy milk.  Calcium-fortified rice or almond milk. There are 300 mg of calcium in 1 cup of calcium-fortified rice or almond milk.  Canned salmon with edible bones. There are 180 mg of calcium in 3 oz of canned salmon with edible bones.  Calcium-fortified breakfast cereals. There are [phone] mg of calcium in calcium-fortified breakfast cereals.  Tofu set with calcium sulfate. There are 250 mg of calcium in  cup of tofu set with calcium sulfate.  Spinach, cooked. There are 145 mg of calcium in  cup of cooked spinach.  Edamame, cooked. There are 130 mg of calcium in  cup of cooked edamame.  Collard greens, cooked. There are 125 mg of calcium in  cup of cooked collard greens.  Kale, frozen or cooked. There are 90 mg of calcium in  cup of cooked or frozen kale.  Almonds. There are 95 mg of calcium in  cup of almonds.  Broccoli, cooked. There are 60 mg of calcium in 1 cup of cooked broccoli. This information is not intended to replace advice given to you by your health care provider. Make sure you discuss any questions you have with your health care provider. Document Released: 11/12/2001 Document Revised: 10/29/2015 Document Reviewed: 08/23/2013 Elsevier Interactive Patient Education  2017 [employer].

## 2016-09-20 ENCOUNTER — Encounter: Payer: Self-pay | Admitting: Gastroenterology

## 2016-09-20 NOTE — Telephone Encounter (Signed)
I did discuss briefly about Rifaximin for possible small intestinal bacterial overgrowth but informed her that we will first try Linzess for constipation and also IB Gard to see if it improves bloating. If no significant improvement then we may consider the antibiotic.

## 2016-09-20 NOTE — Telephone Encounter (Signed)
Dr Silverio Decamp patient states she was suppose to receive an antibiotic, I dont see anything but Linzess and IB Donald Prose

## 2016-09-22 ENCOUNTER — Ambulatory Visit (HOSPITAL_COMMUNITY)
Admission: RE | Admit: 2016-09-22 | Discharge: 2016-09-22 | Disposition: A | Discharge status: Home / Self Care | Payer: BLUE CROSS/BLUE SHIELD | Source: Ambulatory Visit | Attending: Gastroenterology | Admitting: Gastroenterology

## 2016-09-22 DIAGNOSIS — K8020 Calculus of gallbladder without cholecystitis without obstruction: Secondary | ICD-10-CM | POA: Insufficient documentation

## 2016-09-22 DIAGNOSIS — Z86010 Personal history of colonic polyps: Secondary | ICD-10-CM | POA: Diagnosis not present

## 2016-09-22 DIAGNOSIS — R1013 Epigastric pain: Secondary | ICD-10-CM | POA: Diagnosis not present

## 2016-09-22 DIAGNOSIS — K581 Irritable bowel syndrome with constipation: Secondary | ICD-10-CM | POA: Diagnosis not present

## 2016-09-22 DIAGNOSIS — R140 Abdominal distension (gaseous): Secondary | ICD-10-CM | POA: Diagnosis not present

## 2016-09-28 NOTE — Telephone Encounter (Signed)
Patient is aware of referral appointment with [facility] Dr Barry Dienes for gallbladder surgery on 10/13/2016 at 1:45pm.

## 2016-09-29 ENCOUNTER — Other Ambulatory Visit: Payer: Self-pay | Admitting: *Deleted

## 2016-09-29 NOTE — Telephone Encounter (Signed)
Ok to refill? Last filled 02/22/16 #90 1RF. Will need written Rx to fax to mail order.

## 2016-09-30 ENCOUNTER — Ambulatory Visit (AMBULATORY_SURGERY_CENTER): Payer: BLUE CROSS/BLUE SHIELD | Admitting: Gastroenterology

## 2016-09-30 ENCOUNTER — Encounter: Payer: Self-pay | Admitting: Gastroenterology

## 2016-09-30 VITALS — BP 134/77 | HR 60 | Temp 97.7°F | Resp 11 | Ht 65.0 in | Wt 140.0 lb

## 2016-09-30 DIAGNOSIS — D124 Benign neoplasm of descending colon: Secondary | ICD-10-CM | POA: Diagnosis not present

## 2016-09-30 DIAGNOSIS — Z1211 Encounter for screening for malignant neoplasm of colon: Secondary | ICD-10-CM | POA: Diagnosis not present

## 2016-09-30 DIAGNOSIS — Z86010 Personal history of colonic polyps: Secondary | ICD-10-CM | POA: Diagnosis present

## 2016-09-30 MED ORDER — SODIUM CHLORIDE 0.9 % IV SOLN: 500.0000 mL | INTRAVENOUS | Status: DC

## 2016-09-30 MED ORDER — CILIDINIUM-CHLORDIAZEPOXIDE 2.5-5 MG PO CAPS
1.0000 | ORAL_CAPSULE | Freq: Every morning | ORAL | 1 refills | Status: DC
Start: 2016-09-30 — End: 2017-07-28

## 2016-09-30 NOTE — Telephone Encounter (Signed)
printed and in Kim's box. 

## 2016-09-30 NOTE — Progress Notes (Signed)
A and O x3. Report to RN. Tolerated MAC anesthesia well.

## 2016-09-30 NOTE — Progress Notes (Signed)
Called to room to assist during endoscopic procedure.  Patient ID and intended procedure confirmed with present staff. Received instructions for my participation in the procedure from the performing physician.  

## 2016-09-30 NOTE — Patient Instructions (Addendum)
YOU HAD AN ENDOSCOPIC PROCEDURE TODAY AT [facility]:   Refer to the procedure report that was given to you for any specific questions about what was found during the examination.  If the procedure report does not answer your questions, please call your gastroenterologist to clarify.  If you requested that your care partner not be given the details of your procedure findings, then the procedure report has been included in a sealed envelope for you to review at your convenience later.  YOU SHOULD EXPECT: Some feelings of bloating in the abdomen. Passage of more gas than usual.  Walking can help get rid of the air that was put into your GI tract during the procedure and reduce the bloating. If you had a lower endoscopy (such as a colonoscopy or flexible sigmoidoscopy) you may notice spotting of blood in your stool or on the toilet paper. If you underwent a bowel prep for your procedure, you may not have a normal bowel movement for a few days.  Please Note:  You might notice some irritation and congestion in your nose or some drainage.  This is from the oxygen used during your procedure.  There is no need for concern and it should clear up in a day or so.  SYMPTOMS TO REPORT IMMEDIATELY:   Following lower endoscopy (colonoscopy or flexible sigmoidoscopy):  Excessive amounts of blood in the stool  Significant tenderness or worsening of abdominal pains  Swelling of the abdomen that is new, acute  Fever of 100F or higher  For urgent or emergent issues, a gastroenterologist can be reached at any hour by calling [phone].   DIET:  Follow Low FODMAP diet. Avoid artificial sweeteners.  Drink plenty of fluids but you should avoid alcoholic beverages for 24 hours.  MEDICATIONS:  Continue present medications. Add Probiotic VSL#3 (112 Billion Units) 1 capsule daily. Patient given samples (8 capsules).  Please see handouts given to you by your recovery nurse.  ACTIVITY:  You should  plan to take it easy for the rest of today and you should NOT DRIVE or use heavy machinery until tomorrow (because of the sedation medicines used during the test).    FOLLOW UP: Our staff will call the number listed on your records the next business day following your procedure to check on you and address any questions or concerns that you may have regarding the information given to you following your procedure. If we do not reach you, we will leave a message.  However, if you are feeling well and you are not experiencing any problems, there is no need to return our call.  We will assume that you have returned to your regular daily activities without incident.  If any biopsies were taken you will be contacted by phone or by letter within the next 1-3 weeks.  Please call us at [phone] if you have not heard about the biopsies in 3 weeks.   Thank you for allowing [place] to provide for your healthcare needs today.   SIGNATURES/CONFIDENTIALITY: You and/or your care partner have signed paperwork which will be entered into your electronic medical record.  These signatures attest to the fact that that the information above on your After Visit Summary has been reviewed and is understood.  Full responsibility of the confidentiality of this discharge information lies with you and/or your care-partner.

## 2016-09-30 NOTE — Op Note (Addendum)
[facility] Patient Name: Elizabeth Long Procedure Date: 09/30/2016 9:45 AM MRN: [id number] Endoscopist: Mauri Pole , MD Age: 56 Referring MD:  Date of Birth: 1961-01-13 Gender: Female Account #: [id number] Procedure:                Colonoscopy Indications:              High risk colon cancer surveillance: Personal                            history of adenoma (10 mm or greater in size), Last                            colonoscopy: 2013 Medicines:                Monitored Anesthesia Care Procedure:                Pre-Anesthesia Assessment:                           - Prior to the procedure, a History and Physical                            was performed, and patient medications and                            allergies were reviewed. The patient's tolerance of                            previous anesthesia was also reviewed. The risks                            and benefits of the procedure and the sedation                            options and risks were discussed with the patient.                            All questions were answered, and informed consent                            was obtained. Prior Anticoagulants: The patient has                            taken no previous anticoagulant or antiplatelet                            agents. ASA Grade Assessment: II - A patient with                            mild systemic disease. After reviewing the risks                            and benefits, the patient was deemed in  satisfactory condition to undergo the procedure.                           After obtaining informed consent, the colonoscope                            was passed under direct vision. Throughout the                            procedure, the patient's blood pressure, pulse, and                            oxygen saturations were monitored continuously. The                            Colonoscope was introduced through the  anus and                            advanced to the the cecum, identified by                            appendiceal orifice and ileocecal valve. The                            colonoscopy was performed without difficulty. The                            patient tolerated the procedure well. The quality                            of the bowel preparation was excellent. The                            ileocecal valve, appendiceal orifice, and rectum                            were photographed. Scope In: 9:50:34 AM Scope Out: 10:11:11 AM Scope Withdrawal Time: 0 hours 15 minutes 13 seconds  Total Procedure Duration: 0 hours 20 minutes 37 seconds  Findings:                 The perianal and digital rectal examinations were                            normal.                           A 12 mm polyp was found in the descending colon.                            The polyp was granular lateral spreading. The polyp                            was removed with a cold snare. Resection and  retrieval were complete.                           Non-bleeding internal hemorrhoids were found during                            retroflexion. The hemorrhoids were small. Complications:            No immediate complications. Estimated Blood Loss:     Estimated blood loss was minimal. Impression:               - One 12 mm polyp in the descending colon, removed                            with a cold snare. Resected and retrieved.                           - Non-bleeding internal hemorrhoids. Recommendation:           - Patient has a contact number available for                            emergencies. The signs and symptoms of potential                            delayed complications were discussed with the                            patient. Return to normal activities tomorrow.                            Written discharge instructions were provided to the                             patient.                           - Resume previous diet.                           - Continue present medications.                           - Await pathology results.                           - Repeat colonoscopy in 3 - 5 years for                            surveillance based on pathology results.                           - Probiotic VSL#3 (112 Billion units) 1 capsule                            daily                           -  Low FODMAP diet                           - Avoid artificial sweeteners Mauri Pole, MD 09/30/2016 10:16:29 AM This report has been signed electronically.

## 2016-09-30 NOTE — Telephone Encounter (Signed)
Rx faxed to [facility].

## 2016-10-01 ENCOUNTER — Encounter: Payer: Self-pay | Admitting: Family Medicine

## 2016-10-03 ENCOUNTER — Telehealth: Payer: Self-pay

## 2016-10-03 NOTE — Telephone Encounter (Signed)
  Follow up Call-  Call back number 09/30/2016  Post procedure Call Back phone  # [phone]  Permission to leave phone message Yes  Some recent data might be hidden     Patient questions:  Do you have a fever, pain , or abdominal swelling? No. Pain Score  0 *  Have you tolerated food without any problems? Yes.    Have you been able to return to your normal activities? Yes.    Do you have any questions about your discharge instructions: Diet   No. Medications  No. Follow up visit  No.  Do you have questions or concerns about your Care? No.  Actions: * If pain score is 4 or above: No action needed, pain <4.

## 2016-10-06 ENCOUNTER — Encounter: Payer: Self-pay | Admitting: Family Medicine

## 2016-10-06 ENCOUNTER — Encounter: Payer: Self-pay | Admitting: Gastroenterology

## 2016-10-07 MED ORDER — VENLAFAXINE HCL ER 150 MG PO CP24: 150.0000 mg | ORAL_CAPSULE | Freq: Every day | ORAL | 3 refills | Status: DC

## 2016-10-07 MED ORDER — HYDROXYZINE HCL 50 MG PO TABS: 50.0000 mg | ORAL_TABLET | Freq: Every evening | ORAL | 3 refills | Status: DC | PRN

## 2016-10-07 MED ORDER — LORAZEPAM 2 MG PO TABS: 1.0000 mg | ORAL_TABLET | Freq: Every evening | ORAL | 0 refills | Status: DC | PRN

## 2016-10-07 NOTE — Telephone Encounter (Signed)
[facility] was phoned in 09/30/2016 Ok to refill ativan at 2mg  1/2-1 tab QHS #30.  Other 2 refilled.

## 2016-10-10 NOTE — Telephone Encounter (Signed)
Lorazepam called into [facility] as instructed by Dr. Danise Mina.

## 2016-10-13 ENCOUNTER — Other Ambulatory Visit: Payer: Self-pay | Admitting: General Surgery

## 2016-10-13 DIAGNOSIS — K8010 Calculus of gallbladder with chronic cholecystitis without obstruction: Secondary | ICD-10-CM | POA: Diagnosis not present

## 2016-10-18 ENCOUNTER — Encounter (HOSPITAL_COMMUNITY): Payer: Self-pay | Admitting: *Deleted

## 2016-10-18 NOTE — Progress Notes (Addendum)
Elizabeth Long reports that she was  told around 3:00 by office that surgery has been moved to  1:00 pm. I called the office and was told that OR is scheduled for 11:30, I called patient back and informed her of arrival at 0900. Elizabeth Long reports that she had a cardiac work up last year and it was negative.  Patient had been experiencing chest pain and has a family history of cardiac issues.  "I told them it was my gallbladder."

## 2016-10-19 ENCOUNTER — Ambulatory Visit (HOSPITAL_COMMUNITY): Payer: BLUE CROSS/BLUE SHIELD | Admitting: Anesthesiology

## 2016-10-19 ENCOUNTER — Encounter (HOSPITAL_COMMUNITY): Payer: Self-pay | Admitting: *Deleted

## 2016-10-19 ENCOUNTER — Encounter (HOSPITAL_COMMUNITY)
Admission: RE | Disposition: A | Discharge status: Home / Self Care | Payer: Self-pay | Source: Ambulatory Visit | Attending: General Surgery

## 2016-10-19 ENCOUNTER — Ambulatory Visit (HOSPITAL_COMMUNITY)
Admission: RE | Admit: 2016-10-19 | Discharge: 2016-10-19 | Disposition: A | Discharge status: Home / Self Care | Payer: BLUE CROSS/BLUE SHIELD | Source: Ambulatory Visit | Attending: General Surgery | Admitting: General Surgery

## 2016-10-19 DIAGNOSIS — E7800 Pure hypercholesterolemia, unspecified: Secondary | ICD-10-CM | POA: Insufficient documentation

## 2016-10-19 DIAGNOSIS — F329 Major depressive disorder, single episode, unspecified: Secondary | ICD-10-CM | POA: Diagnosis not present

## 2016-10-19 DIAGNOSIS — I10 Essential (primary) hypertension: Secondary | ICD-10-CM | POA: Insufficient documentation

## 2016-10-19 DIAGNOSIS — Z90710 Acquired absence of both cervix and uterus: Secondary | ICD-10-CM | POA: Insufficient documentation

## 2016-10-19 DIAGNOSIS — Z9049 Acquired absence of other specified parts of digestive tract: Secondary | ICD-10-CM | POA: Diagnosis not present

## 2016-10-19 DIAGNOSIS — Z880 Allergy status to penicillin: Secondary | ICD-10-CM | POA: Diagnosis not present

## 2016-10-19 DIAGNOSIS — F1010 Alcohol abuse, uncomplicated: Secondary | ICD-10-CM | POA: Diagnosis not present

## 2016-10-19 DIAGNOSIS — K8010 Calculus of gallbladder with chronic cholecystitis without obstruction: Secondary | ICD-10-CM | POA: Insufficient documentation

## 2016-10-19 DIAGNOSIS — F418 Other specified anxiety disorders: Secondary | ICD-10-CM | POA: Diagnosis not present

## 2016-10-19 DIAGNOSIS — M1990 Unspecified osteoarthritis, unspecified site: Secondary | ICD-10-CM | POA: Diagnosis not present

## 2016-10-19 DIAGNOSIS — Z87891 Personal history of nicotine dependence: Secondary | ICD-10-CM | POA: Diagnosis not present

## 2016-10-19 DIAGNOSIS — F419 Anxiety disorder, unspecified: Secondary | ICD-10-CM | POA: Insufficient documentation

## 2016-10-19 DIAGNOSIS — K219 Gastro-esophageal reflux disease without esophagitis: Secondary | ICD-10-CM | POA: Insufficient documentation

## 2016-10-19 DIAGNOSIS — Z79899 Other long term (current) drug therapy: Secondary | ICD-10-CM | POA: Diagnosis not present

## 2016-10-19 HISTORY — DX: Family history of other specified conditions: Z84.89

## 2016-10-19 HISTORY — DX: Irritable bowel syndrome, unspecified: K58.9

## 2016-10-19 HISTORY — PX: CHOLECYSTECTOMY: SHX55

## 2016-10-19 LAB — CBC WITH DIFFERENTIAL/PLATELET
Basophils Absolute: 0 10*3/uL (ref 0.0–0.1)
Basophils Relative: 1 %
Eosinophils Absolute: 0.3 10*3/uL (ref 0.0–0.7)
Eosinophils Relative: 5 %
HEMATOCRIT: 43.4 % (ref 36.0–46.0)
HEMOGLOBIN: 14.8 g/dL (ref 12.0–15.0)
LYMPHS ABS: 3.2 10*3/uL (ref 0.7–4.0)
Lymphocytes Relative: 50 %
MCH: 30.3 pg (ref 26.0–34.0)
MCHC: 34.1 g/dL (ref 30.0–36.0)
MCV: 88.8 fL (ref 78.0–100.0)
MONO ABS: 0.4 10*3/uL (ref 0.1–1.0)
MONOS PCT: 7 %
NEUTROS ABS: 2.4 10*3/uL (ref 1.7–7.7)
NEUTROS PCT: 37 %
Platelets: 330 10*3/uL (ref 150–400)
RBC: 4.89 MIL/uL (ref 3.87–5.11)
RDW: 12.4 % (ref 11.5–15.5)
WBC: 6.4 10*3/uL (ref 4.0–10.5)

## 2016-10-19 LAB — COMPREHENSIVE METABOLIC PANEL
ALT: 22 U/L (ref 14–54)
ANION GAP: 8 (ref 5–15)
AST: 33 U/L (ref 15–41)
Albumin: 4.1 g/dL (ref 3.5–5.0)
Alkaline Phosphatase: 50 U/L (ref 38–126)
BILIRUBIN TOTAL: 0.3 mg/dL (ref 0.3–1.2)
BUN: 6 mg/dL (ref 6–20)
CALCIUM: 9.7 mg/dL (ref 8.9–10.3)
CO2: 31 mmol/L (ref 22–32)
Chloride: 99 mmol/L — ABNORMAL LOW (ref 101–111)
Creatinine, Ser: 0.82 mg/dL (ref 0.44–1.00)
GLUCOSE: 90 mg/dL (ref 65–99)
POTASSIUM: 3.4 mmol/L — AB (ref 3.5–5.1)
Sodium: 138 mmol/L (ref 135–145)
TOTAL PROTEIN: 7.3 g/dL (ref 6.5–8.1)

## 2016-10-19 LAB — PROTIME-INR
INR: 0.97
PROTHROMBIN TIME: 12.9 s (ref 11.4–15.2)

## 2016-10-19 SURGERY — LAPAROSCOPIC CHOLECYSTECTOMY WITH INTRAOPERATIVE CHOLANGIOGRAM
Anesthesia: General

## 2016-10-19 MED ORDER — ONDANSETRON HCL 4 MG/2ML IJ SOLN
INTRAMUSCULAR | Status: AC
  Filled 2016-10-19: qty 2

## 2016-10-19 MED ORDER — ONDANSETRON HCL 4 MG/2ML IJ SOLN
INTRAMUSCULAR | Status: DC | PRN
  Administered 2016-10-19: 4 mg via INTRAVENOUS

## 2016-10-19 MED ORDER — IOPAMIDOL (ISOVUE-300) INJECTION 61%
INTRAVENOUS | Status: AC
  Filled 2016-10-19: qty 50

## 2016-10-19 MED ORDER — BUPIVACAINE HCL (PF) 0.25 % IJ SOLN
INTRAMUSCULAR | Status: AC
  Filled 2016-10-19: qty 30

## 2016-10-19 MED ORDER — OXYCODONE HCL 5 MG PO TABS: 5.0000 mg | ORAL_TABLET | Freq: Four times a day (QID) | ORAL | 0 refills | Status: DC | PRN

## 2016-10-19 MED ORDER — MIDAZOLAM HCL 2 MG/2ML IJ SOLN
INTRAMUSCULAR | Status: DC | PRN
  Administered 2016-10-19 (×2): 2 mg via INTRAVENOUS

## 2016-10-19 MED ORDER — DEXAMETHASONE SODIUM PHOSPHATE 10 MG/ML IJ SOLN
INTRAMUSCULAR | Status: AC
  Filled 2016-10-19: qty 1

## 2016-10-19 MED ORDER — ACETAMINOPHEN 650 MG RE SUPP: 650.0000 mg | RECTAL | Status: DC | PRN

## 2016-10-19 MED ORDER — CHLORHEXIDINE GLUCONATE CLOTH 2 % EX PADS: 6.0000 | MEDICATED_PAD | Freq: Once | CUTANEOUS | Status: DC

## 2016-10-19 MED ORDER — LIDOCAINE-EPINEPHRINE (PF) 1 %-1:200000 IJ SOLN
INTRAMUSCULAR | Status: AC
  Filled 2016-10-19: qty 30

## 2016-10-19 MED ORDER — MIDAZOLAM HCL 2 MG/2ML IJ SOLN
INTRAMUSCULAR | Status: AC
  Filled 2016-10-19: qty 2

## 2016-10-19 MED ORDER — CIPROFLOXACIN IN D5W 400 MG/200ML IV SOLN
INTRAVENOUS | Status: AC
Start: 2016-10-19 — End: 2016-10-19
  Filled 2016-10-19: qty 200

## 2016-10-19 MED ORDER — FENTANYL CITRATE (PF) 250 MCG/5ML IJ SOLN
INTRAMUSCULAR | Status: AC
  Filled 2016-10-19: qty 5

## 2016-10-19 MED ORDER — SODIUM CHLORIDE 0.9% FLUSH: 3.0000 mL | INTRAVENOUS | Status: DC | PRN

## 2016-10-19 MED ORDER — FENTANYL CITRATE (PF) 100 MCG/2ML IJ SOLN
INTRAMUSCULAR | Status: DC | PRN
Start: 2016-10-19 — End: 2016-10-19
  Administered 2016-10-19: 100 ug via INTRAVENOUS
  Administered 2016-10-19: 50 ug via INTRAVENOUS
  Administered 2016-10-19: 100 ug via INTRAVENOUS

## 2016-10-19 MED ORDER — SODIUM CHLORIDE 0.9 % IV SOLN: 250.0000 mL | INTRAVENOUS | Status: DC | PRN

## 2016-10-19 MED ORDER — PROPOFOL 10 MG/ML IV BOLUS
INTRAVENOUS | Status: DC | PRN
  Administered 2016-10-19: 130 mg via INTRAVENOUS

## 2016-10-19 MED ORDER — CIPROFLOXACIN IN D5W 400 MG/200ML IV SOLN
400.0000 mg | INTRAVENOUS | Status: AC
  Administered 2016-10-19: 400 mg via INTRAVENOUS

## 2016-10-19 MED ORDER — FENTANYL CITRATE (PF) 100 MCG/2ML IJ SOLN
INTRAMUSCULAR | Status: AC
  Filled 2016-10-19: qty 2

## 2016-10-19 MED ORDER — ROCURONIUM BROMIDE 10 MG/ML (PF) SYRINGE
PREFILLED_SYRINGE | INTRAVENOUS | Status: AC
Start: 2016-10-19 — End: 2016-10-19
  Filled 2016-10-19: qty 5

## 2016-10-19 MED ORDER — 0.9 % SODIUM CHLORIDE (POUR BTL) OPTIME
TOPICAL | Status: DC | PRN
  Administered 2016-10-19: 1000 mL

## 2016-10-19 MED ORDER — LACTATED RINGERS IV SOLN
INTRAVENOUS | Status: DC
  Administered 2016-10-19 (×3): via INTRAVENOUS

## 2016-10-19 MED ORDER — FENTANYL CITRATE (PF) 100 MCG/2ML IJ SOLN
25.0000 ug | INTRAMUSCULAR | Status: DC | PRN
  Administered 2016-10-19 (×2): 50 ug via INTRAVENOUS

## 2016-10-19 MED ORDER — SUGAMMADEX SODIUM 200 MG/2ML IV SOLN
INTRAVENOUS | Status: AC
  Filled 2016-10-19: qty 2

## 2016-10-19 MED ORDER — OXYCODONE HCL 5 MG PO TABS: 5.0000 mg | ORAL_TABLET | ORAL | Status: DC | PRN

## 2016-10-19 MED ORDER — LIDOCAINE 2% (20 MG/ML) 5 ML SYRINGE
INTRAMUSCULAR | Status: DC | PRN
  Administered 2016-10-19: 60 mg via INTRAVENOUS

## 2016-10-19 MED ORDER — HYDROMORPHONE HCL 1 MG/ML IJ SOLN: 0.2500 mg | INTRAMUSCULAR | Status: DC | PRN

## 2016-10-19 MED ORDER — EPHEDRINE SULFATE-NACL 50-0.9 MG/10ML-% IV SOSY
PREFILLED_SYRINGE | INTRAVENOUS | Status: DC | PRN
  Administered 2016-10-19 (×2): 10 mg via INTRAVENOUS

## 2016-10-19 MED ORDER — SUGAMMADEX SODIUM 200 MG/2ML IV SOLN
INTRAVENOUS | Status: DC | PRN
  Administered 2016-10-19: 250 mg via INTRAVENOUS

## 2016-10-19 MED ORDER — ACETAMINOPHEN 325 MG PO TABS
650.0000 mg | ORAL_TABLET | ORAL | Status: DC | PRN
Start: 2016-10-19 — End: 2016-10-19

## 2016-10-19 MED ORDER — SODIUM CHLORIDE 0.9% FLUSH: 3.0000 mL | Freq: Two times a day (BID) | INTRAVENOUS | Status: DC

## 2016-10-19 MED ORDER — DEXAMETHASONE SODIUM PHOSPHATE 10 MG/ML IJ SOLN
INTRAMUSCULAR | Status: DC | PRN
  Administered 2016-10-19: 10 mg via INTRAVENOUS

## 2016-10-19 MED ORDER — OXYCODONE HCL 5 MG PO TABS: 5.0000 mg | ORAL_TABLET | Freq: Once | ORAL | Status: DC | PRN

## 2016-10-19 MED ORDER — ROCURONIUM BROMIDE 100 MG/10ML IV SOLN
INTRAVENOUS | Status: DC | PRN
  Administered 2016-10-19: 40 mg via INTRAVENOUS
  Administered 2016-10-19: 10 mg via INTRAVENOUS

## 2016-10-19 MED ORDER — OXYCODONE HCL 5 MG/5ML PO SOLN: 5.0000 mg | Freq: Once | ORAL | Status: DC | PRN

## 2016-10-19 MED ORDER — SODIUM CHLORIDE 0.9 % IR SOLN
Status: DC | PRN
Start: 2016-10-19 — End: 2016-10-19
  Administered 2016-10-19: 1000 mL

## 2016-10-19 MED ORDER — LIDOCAINE HCL 1 % IJ SOLN
INTRAMUSCULAR | Status: DC | PRN
  Administered 2016-10-19: 10 mL

## 2016-10-19 SURGICAL SUPPLY — 43 items
ADH SKN CLS APL DERMABOND .7 (GAUZE/BANDAGES/DRESSINGS) ×1
APPLIER CLIP ROT 10 11.4 M/L (STAPLE) ×2
BAG SPEC RTRVL LRG 6X4 10 (ENDOMECHANICALS) ×1
BLADE CLIPPER SURG (BLADE) IMPLANT
CANISTER SUCT 3000ML PPV (MISCELLANEOUS) ×2 IMPLANT
CHLORAPREP W/TINT 26ML (MISCELLANEOUS) ×2 IMPLANT
CLIP APPLIE ROT 10 11.4 M/L (STAPLE) ×1 IMPLANT
COVER MAYO STAND STRL (DRAPES) ×2 IMPLANT
COVER SURGICAL LIGHT HANDLE (MISCELLANEOUS) ×2 IMPLANT
DERMABOND ADVANCED (GAUZE/BANDAGES/DRESSINGS) ×1
DERMABOND ADVANCED .7 DNX12 (GAUZE/BANDAGES/DRESSINGS) ×1 IMPLANT
DRAPE C-ARM 42X72 X-RAY (DRAPES) ×2 IMPLANT
DRAPE WARM FLUID 44X44 (DRAPE) ×2 IMPLANT
ELECT REM PT RETURN 9FT ADLT (ELECTROSURGICAL) ×2
ELECTRODE REM PT RTRN 9FT ADLT (ELECTROSURGICAL) ×1 IMPLANT
FILTER SMOKE EVAC LAPAROSHD (FILTER) IMPLANT
GLOVE BIO SURGEON STRL SZ 6 (GLOVE) ×2 IMPLANT
GLOVE BIOGEL PI IND STRL 6.5 (GLOVE) ×1 IMPLANT
GLOVE BIOGEL PI INDICATOR 6.5 (GLOVE) ×1
GOWN STRL REUS W/ TWL LRG LVL3 (GOWN DISPOSABLE) ×2 IMPLANT
GOWN STRL REUS W/TWL 2XL LVL3 (GOWN DISPOSABLE) ×2 IMPLANT
GOWN STRL REUS W/TWL LRG LVL3 (GOWN DISPOSABLE) ×4
KIT BASIN OR (CUSTOM PROCEDURE TRAY) ×2 IMPLANT
KIT ROOM TURNOVER OR (KITS) ×2 IMPLANT
L-HOOK LAP DISP 36CM (ELECTROSURGICAL) ×2
LHOOK LAP DISP 36CM (ELECTROSURGICAL) ×1 IMPLANT
NS IRRIG 1000ML POUR BTL (IV SOLUTION) ×2 IMPLANT
PAD ARMBOARD 7.5X6 YLW CONV (MISCELLANEOUS) ×2 IMPLANT
PENCIL BUTTON HOLSTER BLD 10FT (ELECTRODE) ×2 IMPLANT
POUCH SPECIMEN RETRIEVAL 10MM (ENDOMECHANICALS) ×2 IMPLANT
SCISSORS LAP 5X35 DISP (ENDOMECHANICALS) ×2 IMPLANT
SET CHOLANGIOGRAPH 5 50 .035 (SET/KITS/TRAYS/PACK) ×2 IMPLANT
SET IRRIG TUBING LAPAROSCOPIC (IRRIGATION / IRRIGATOR) ×2 IMPLANT
SLEEVE ENDOPATH XCEL 5M (ENDOMECHANICALS) ×2 IMPLANT
SPECIMEN JAR SMALL (MISCELLANEOUS) ×2 IMPLANT
SUT MNCRL AB 4-0 PS2 18 (SUTURE) ×2 IMPLANT
TOWEL OR 17X24 6PK STRL BLUE (TOWEL DISPOSABLE) ×2 IMPLANT
TOWEL OR 17X26 10 PK STRL BLUE (TOWEL DISPOSABLE) ×2 IMPLANT
TRAY LAPAROSCOPIC MC (CUSTOM PROCEDURE TRAY) ×2 IMPLANT
TROCAR XCEL BLUNT TIP 100MML (ENDOMECHANICALS) ×2 IMPLANT
TROCAR XCEL NON-BLD 11X100MML (ENDOMECHANICALS) ×2 IMPLANT
TROCAR XCEL NON-BLD 5MMX100MML (ENDOMECHANICALS) ×2 IMPLANT
TUBING INSUFFLATION (TUBING) ×2 IMPLANT

## 2016-10-19 NOTE — Anesthesia Postprocedure Evaluation (Signed)
Anesthesia Post Note  Patient: KALKIDAN CAUDELL  Procedure(s) Performed: Procedure(s) (LRB): LAPAROSCOPIC CHOLECYSTECTOMY (N/A)  Patient location during evaluation: PACU Anesthesia Type: General Level of consciousness: awake and alert Pain management: pain level controlled Vital Signs Assessment: post-procedure vital signs reviewed and stable Respiratory status: spontaneous breathing, nonlabored ventilation and respiratory function stable Cardiovascular status: blood pressure returned to baseline and stable Postop Assessment: no signs of nausea or vomiting Anesthetic complications: no       Last Vitals:  Vitals:   10/19/16 1349 10/19/16 1400  BP: 121/71 115/73  Pulse: 76 75  Resp: 11 11  Temp:  36.3 C    Last Pain:  Vitals:   10/19/16 1400  TempSrc:   PainSc: Asleep                 Magdalena Skilton,W. EDMOND

## 2016-10-19 NOTE — Progress Notes (Signed)
D; DC to Phase 2, Pt is in BR with assist now.

## 2016-10-19 NOTE — Transfer of Care (Signed)
Immediate Anesthesia Transfer of Care Note  Patient: Elizabeth Long  Procedure(s) Performed: Procedure(s): LAPAROSCOPIC CHOLECYSTECTOMY (N/A)  Patient Location: PACU  Anesthesia Type:General  Level of Consciousness: awake, alert  and oriented  Airway & Oxygen Therapy: Patient Spontanous Breathing and Patient connected to nasal cannula oxygen  Post-op Assessment: Report given to RN, Post -op Vital signs reviewed and stable and Patient moving all extremities  Post vital signs: Reviewed and stable  Last Vitals:  Vitals:   10/19/16 0900  BP: 119/76  Pulse: 60  Resp: 20  Temp: 36.4 C    Last Pain:  Vitals:   10/19/16 0900  TempSrc: Oral      Patients Stated Pain Goal: 3 (03/70/48 8891)  Complications: No apparent anesthesia complications

## 2016-10-19 NOTE — Anesthesia Procedure Notes (Signed)
Procedure Name: Intubation Date/Time: 10/19/2016 12:02 PM Performed by: Trixie Deis A Pre-anesthesia Checklist: Patient identified, Emergency Drugs available, Suction available and Patient being monitored Patient Re-evaluated:Patient Re-evaluated prior to inductionOxygen Delivery Method: Circle System Utilized Preoxygenation: Pre-oxygenation with 100% oxygen Intubation Type: IV induction Ventilation: Mask ventilation without difficulty Laryngoscope Size: Mac and 3 Grade View: Grade I Tube type: Oral Tube size: 7.0 mm Number of attempts: 1 Airway Equipment and Method: Stylet and Oral airway Placement Confirmation: ETT inserted through vocal cords under direct vision,  positive ETCO2 and breath sounds checked- equal and bilateral Secured at: 22 cm Tube secured with: Tape Dental Injury: Teeth and Oropharynx as per pre-operative assessment

## 2016-10-19 NOTE — Interval H&P Note (Signed)
History and Physical Interval Note:  10/19/2016 11:43 AM  Elizabeth Long  has presented today for surgery, with the diagnosis of chronic cholecystitis with cholelithiasis  The various methods of treatment have been discussed with the patient and family. After consideration of risks, benefits and other options for treatment, the patient has consented to  Procedure(s): LAPAROSCOPIC CHOLECYSTECTOMY WITH POSSIBLE INTRAOPERATIVE CHOLANGIOGRAM (N/A) as a surgical intervention .  The patient's history has been reviewed, patient examined, no change in status, stable for surgery.  I have reviewed the patient's chart and labs.  Questions were answered to the patient's satisfaction.     Elizabeth Long

## 2016-10-19 NOTE — Discharge Instructions (Addendum)
[facility] Office Phone Number [phone]   POST OP INSTRUCTIONS  Always review your discharge instruction sheet given to you by the facility where your surgery was performed.  IF YOU HAVE DISABILITY OR FAMILY LEAVE FORMS, YOU MUST BRING THEM TO THE OFFICE FOR PROCESSING.  DO NOT GIVE THEM TO YOUR DOCTOR.  1. A prescription for pain medication may be given to you upon discharge.  Take your pain medication as prescribed, if needed.  If narcotic pain medicine is not needed, then you may take acetaminophen (Tylenol) or ibuprofen (Advil) as needed. 2. Take your usually prescribed medications unless otherwise directed 3. If you need a refill on your pain medication, please contact your pharmacy.  They will contact our office to request authorization.  Prescriptions will not be filled after 5pm or on week-ends. 4. You should eat very light the first 24 hours after surgery, such as soup, crackers, pudding, etc.  Resume your normal diet the day after surgery 5. It is common to experience some constipation if taking pain medication after surgery.  Increasing fluid intake and taking a stool softener will usually help or prevent this problem from occurring.  A mild laxative (Milk of Magnesia or Miralax) should be taken according to package directions if there are no bowel movements after 48 hours. 6. You may shower in 48 hours.  The surgical glue will flake off in 2-3 weeks.   7. ACTIVITIES:  No strenuous activity or heavy lifting for 2 week.   a. You may drive when you no longer are taking prescription pain medication, you can comfortably wear a seatbelt, and you can safely maneuver your car and apply brakes. b. RETURN TO WORK:  __________1-3 weeks_______________ Dennis Bast should see your doctor in the office for a follow-up appointment approximately three-four weeks after your surgery.    WHEN TO CALL YOUR DOCTOR: 1. Fever over 101.0 2. Nausea and/or vomiting. 3. Extreme swelling or  bruising. 4. Continued bleeding from incision. 5. Increased pain, redness, or drainage from the incision.  The clinic staff is available to answer your questions during regular business hours.  Please dont hesitate to call and ask to speak to one of the nurses for clinical concerns.  If you have a medical emergency, go to the nearest emergency room or call 911.  A surgeon from [facility] is always on call at the hospital.  For further questions, please visit [email or web address]

## 2016-10-19 NOTE — Anesthesia Preprocedure Evaluation (Addendum)
Anesthesia Evaluation  Patient identified by MRN, date of birth, ID band Patient awake    Reviewed: Allergy & Precautions, H&P , NPO status , Patient's Chart, lab work & pertinent test results  Airway Mallampati: II  TM Distance: >3 FB Neck ROM: Full    Dental no notable dental hx. (+) Teeth Intact, Dental Advisory Given   Pulmonary neg pulmonary ROS, former smoker,    Pulmonary exam normal breath sounds clear to auscultation       Cardiovascular hypertension, Pt. on medications  Rhythm:Regular Rate:Normal     Neuro/Psych  Headaches, Anxiety Depression    GI/Hepatic Neg liver ROS, GERD  Medicated and Controlled,  Endo/Other  negative endocrine ROS  Renal/GU negative Renal ROS  negative genitourinary   Musculoskeletal  (+) Arthritis , Osteoarthritis,    Abdominal   Peds  Hematology negative hematology ROS (+)   Anesthesia Other Findings   Reproductive/Obstetrics negative OB ROS                            Anesthesia Physical Anesthesia Plan  ASA: II  Anesthesia Plan: General   Post-op Pain Management:    Induction: Intravenous  Airway Management Planned: Oral ETT  Additional Equipment:   Intra-op Plan:   Post-operative Plan: Extubation in OR  Informed Consent: I have reviewed the patients History and Physical, chart, labs and discussed the procedure including the risks, benefits and alternatives for the proposed anesthesia with the patient or authorized representative who has indicated his/her understanding and acceptance.   Dental advisory given  Plan Discussed with: CRNA  Anesthesia Plan Comments:         Anesthesia Quick Evaluation

## 2016-10-19 NOTE — Op Note (Signed)
Laparoscopic Cholecystectomy with IOC Procedure Note  Indications: This patient presents with chronic calculous cholecystitis and will undergo laparoscopic cholecystectomy.  Pre-operative Diagnosis: see above  Post-operative Diagnosis: Same  Surgeon: Stark Klein   Assistants: n/a  Anesthesia: General endotracheal anesthesia and local  ASA Class: 3  Procedure Details  The patient was seen again in the Holding Room. The risks, benefits, complications, treatment options, and expected outcomes were discussed with the patient. The possibilities of  bleeding, recurrent infection, damage to nearby structures, the need for additional procedures, failure to diagnose a condition, the possible need to convert to an open procedure, and creating a complication requiring transfusion or operation were discussed with the patient. The likelihood of improving the patient's symptoms with return to their baseline status is good.    The patient and/or family concurred with the proposed plan, giving informed consent. The site of surgery properly noted. The patient was taken to Operating Room, and the procedure verified as Laparoscopic Cholecystectomy with Intraoperative Cholangiogram. A Time Out was held and the above information confirmed.  Prior to the induction of general anesthesia, antibiotic prophylaxis was administered. General endotracheal anesthesia was then administered and tolerated well. After the induction, the abdomen was prepped with Chloraprep and draped in the sterile fashion. The patient was positioned in the supine position.  Local anesthetic agent was injected into the skin near the umbilicus and an incision made. We dissected down to the abdominal fascia with blunt dissection.  The fascia was incised vertically and we entered the peritoneal cavity bluntly.  A pursestring suture of 0-Vicryl was placed around the fascial opening.  The Hasson cannula was inserted and secured with the stay suture.   Pneumoperitoneum was then created with CO2 and tolerated well without any adverse changes in the patient's vital signs. An 11-mm port was placed in the subxiphoid position.  Two 5-mm ports were placed in the right upper quadrant. All skin incisions were infiltrated with a local anesthetic agent before making the incision and placing the trocars.   We positioned the patient in reverse Trendelenburg, tilted slightly to the patient's left.  The gallbladder was identified, the fundus grasped and retracted cephalad. Adhesions were lysed bluntly and with the electrocautery where indicated, taking care not to injure any adjacent organs or viscus. The infundibulum was grasped and retracted laterally, exposing the peritoneum overlying the triangle of Calot. The node of calot was enlarged.  The artery was directly behind it and it was skeletonized.    A critical view of the cystic duct and cystic artery was obtained.  The cystic artery was anterior, and it was clipped once distally and twice proximally.  The cystic duct was clearly identified and bluntly dissected circumferentially. The cystic duct was very small and the stones were >1 cm.  LFTs were normal.  Therefore, no cholangiogram was performed.  The cystic duct was ligated with a clip distally and three clips proximally.  The gallbladder was dissected from the liver bed in retrograde fashion with the electrocautery. The gallbladder was removed and placed in an Endocatch bag.  The gallbladder and Endocatch bag were then removed through the umbilical port site.  The liver bed was irrigated and inspected. Hemostasis was achieved with the electrocautery. Copious irrigation was utilized and was repeatedly aspirated until clear.    We again inspected the right upper quadrant for hemostasis.  Pneumoperitoneum was released as we removed the trocars.   The pursestring suture was used to close the umbilical fascia.  4-0 Monocryl was  used to close the skin.   The skin  was cleaned and dry, and Dermabond was applied. The patient was then extubated and brought to the recovery room in stable condition. Instrument, sponge, and needle counts were correct at closure and at the conclusion of the case.   Findings: Chronic inflammation. Two large gallstones.      Estimated Blood Loss: min         Drains: none          Specimens: Gallbladder to pathology       Complications: None; patient tolerated the procedure well.         Disposition: PACU - hemodynamically stable.         Condition: stable

## 2016-10-19 NOTE — H&P (Signed)
Elizabeth Long 10/13/2016 1:57 PM Location: [place] [facility] Patient #: [id number] DOB: [birth year] Single / Language: Cleophus Molt / Race: White Female   History of Present Illness Stark Klein MD; 10/13/2016 2:52 PM) Patient words: gb.  The patient is a 56 year old female who presents for evaluation of gall stones. Patient is a 56 year old female referred for consultation by Dr. Silverio Decamp for a new diagnosis of gallstones. The patient not felt well for around a year and half. She had 2 episodes in September with severe epigastric pain where she thought she was dying. She called 911. The pain resolved quickly within 30 minutes and she did not go to the emergency department. She saw the PA at her primary care doctor's office and got evaluated for cardiac issues. All this was negative. She sees GI due to her history of microscopic colitis. Dr. Silverio Decamp ordered a right upper quadrant ultrasound which demonstrated gallstones.  The patient is at the point now where she is an constant discomfort. She has pain in the right upper quadrant and the right shoulder. She feels nauseated significant portion of the time. She has a bad taste in her mouth. She feels bloated and belches a lot. She is having constipation. She also had a long-standing issue with IBS. This is being evaluated as well.  RUQ u/s 09/22/2016 IMPRESSION: 1. Cholelithiasis. No evidence of acute cholecystitis. 2. Otherwise negative sonographic appearance of the abdomen.  CBC and CMET were drawn in September and these were normal   Past Surgical History Sharyn Lull R. Brooks, CMA; 10/13/2016 2:16 PM) Appendectomy  Colon Polyp Removal - Colonoscopy  Hysterectomy (not due to cancer) - Partial  Oral Surgery  Shoulder Surgery  Right. Spinal Surgery - Lower Back  Spinal Surgery - Neck  Tonsillectomy   Diagnostic Studies History Sharyn Lull R. Rolena Infante, CMA; 10/13/2016 2:16 PM) Colonoscopy  within last year Mammogram  within  last year Pap Smear  1-5 years ago  Allergies Sharyn Lull R. Brooks, [place]; 10/13/2016 2:16 PM) Sulfur *CHEMICALS*  Cef[place]an *CEPHALOSPORINS*   Medication History (Michelle R. Brooks, CMA; 10/13/2016 2:18 PM) Chlordiazepoxide-Clidinium (5-2.5MG  Capsule, Oral) Active. HydroCHLOROthiazide (12.5MG  Capsule, Oral) Active. HydrOXYzine HCl (50MG  Tablet, Oral) Active. Linzess (72MCG Capsule, Oral) Active. LORazepam (2MG  Tablet, Oral) Active. Omeprazole (20MG  Capsule DR, Oral) Active. Venlafaxine HCl ER (150MG  Capsule ER 24HR, Oral) Active. Potassium (99MG  Tablet, Oral) Active. Vitamin B-6 CR (100MG  Tablet ER, Oral) Active. TraMADol HCl (50MG  Tablet, Oral) Active. Medications Reconciled  Social History Sharyn Lull R. Brooks, CMA; 10/13/2016 2:16 PM) Alcohol use  Remotely quit alcohol use. Caffeine use  Coffee. Illicit drug use  Remotely quit drug use. Tobacco use  Former smoker.  Family History Sharyn Lull R. Brooks, CMA; 10/13/2016 2:16 PM) Alcohol Abuse  Brother, Family Members In General. Anesthetic complications  Mother. Arthritis  Family Members In General, Father, Mother. Cerebrovascular Accident  Family Members In General. Diabetes Mellitus  Family Members In General. Heart Disease  Family Members In General, Mother. Hypertension  Family Members In General, Father. Ischemic Bowel Disease  Brother, Family Members In General, Mother. Kidney Disease  Family Members In General. Migraine Headache  Mother. Respiratory Condition  Family Members In General. Thyroid problems  Mother.  Pregnancy / Birth History Sharyn Lull R. Brooks, CMA; 10/13/2016 2:16 PM) Age at menarche  40 years. Age of menopause  2-50 Contraceptive History  Oral contraceptives. Gravida  0 Para  0  Other Problems (Michelle R. Brooks, CMA; 10/13/2016 2:16 PM) Alcohol Abuse  Anxiety Disorder  Arthritis  Back Pain  Cholelithiasis  Depression  Gastroesophageal Reflux Disease  High  blood pressure  Hypercholesterolemia  Migraine Headache  Other disease, cancer, significant illness     Review of Systems [facility] R. Brooks CMA; 10/13/2016 2:16 PM) General Present- Chills, Fatigue and Weight Gain. Not Present- Appetite Loss, Fever, Night Sweats and Weight Loss. Skin Not Present- Change in Wart/Mole, Dryness, Hives, Jaundice, New Lesions, Non-Healing Wounds, Rash and Ulcer. HEENT Present- Seasonal Allergies and Wears glasses/contact lenses. Not Present- Earache, Hearing Loss, Hoarseness, Nose Bleed, Oral Ulcers, Ringing in the Ears, Sinus Pain, Sore Throat, Visual Disturbances and Yellow Eyes. Respiratory Not Present- Bloody sputum, Chronic Cough, Difficulty Breathing, Snoring and Wheezing. Breast Not Present- Breast Mass, Breast Pain, Nipple Discharge and Skin Changes. Gastrointestinal Present- Abdominal Pain, Bloating, Constipation, Excessive gas, Indigestion and Nausea. Not Present- Bloody Stool, Change in Bowel Habits, Chronic diarrhea, Difficulty Swallowing, Gets full quickly at meals, Hemorrhoids, Rectal Pain and Vomiting. Female Genitourinary Not Present- Frequency, Nocturia, Painful Urination, Pelvic Pain and Urgency. Musculoskeletal Present- Back Pain and Joint Pain. Not Present- Joint Stiffness, Muscle Pain, Muscle Weakness and Swelling of Extremities. Neurological Present- Numbness and Tingling. Not Present- Decreased Memory, Fainting, Headaches, Seizures, Tremor, Trouble walking and Weakness. Psychiatric Present- Anxiety, Depression and Frequent crying. Not Present- Bipolar, Change in Sleep Pattern and Fearful. Hematology Not Present- Blood Thinners, Easy Bruising, Excessive bleeding, Gland problems, HIV and Persistent Infections.  [employer] R. Brooks CMA; 10/13/2016 2:16 PM) 10/13/2016 2:15 PM Weight: 136.25 lb Height: 65in Body Surface Area: 1.68 m Body Mass Index: 22.67 kg/m  Temp.: 98.8F(Oral)  BP: 122/86 (Sitting, Left Arm,  Standard)       Physical Exam Stark Klein MD; 10/13/2016 2:50 PM) General Mental Status-Alert. General Appearance-Consistent with stated age. Hydration-Well hydrated. Voice-Normal.  Head and Neck Head-normocephalic, atraumatic with no lesions or palpable masses. Trachea-midline. Thyroid Gland Characteristics - normal size and consistency.  Eye Eyeball - Bilateral-Extraocular movements intact. Sclera/Conjunctiva - Bilateral-No scleral icterus.  Chest and Lung Exam Chest and lung exam reveals -quiet, even and easy respiratory effort with no use of accessory muscles and on auscultation, normal breath sounds, no adventitious sounds and normal vocal resonance. Inspection Chest Wall - Normal. Back - normal.  Cardiovascular Cardiovascular examination reveals -normal heart sounds, regular rate and rhythm with no murmurs and normal pedal pulses bilaterally.  Abdomen Inspection Inspection of the abdomen reveals - No Hernias. Palpation/Percussion Palpation and Percussion of the abdomen reveal - Soft, No Rebound tenderness, No Rigidity (guarding) and No hepatosplenomegaly. Note: tender epigastrium and RUQ. Auscultation Auscultation of the abdomen reveals - Bowel sounds normal.  Neurologic Neurologic evaluation reveals -alert and oriented x 3 with no impairment of recent or remote memory. Mental Status-Normal.  Musculoskeletal Global Assessment -Note: no gross deformities.  Normal Exam - Left-Upper Extremity Strength Normal and Lower Extremity Strength Normal. Normal Exam - Right-Upper Extremity Strength Normal and Lower Extremity Strength Normal.  Lymphatic Head & Neck  General Head & Neck Lymphatics: Bilateral - Description - Normal. Axillary  General Axillary Region: Bilateral - Description - Normal. Tenderness - Non Tender. Femoral & Inguinal  Generalized Femoral & Inguinal Lymphatics: Bilateral - Description - No Generalized  lymphadenopathy.    Assessment & Plan Stark Klein MD; 10/13/2016 2:52 PM) CHRONIC CALCULOUS CHOLECYSTITIS (K80.10) Impression: Patient has classic symptoms of biliary colic with chronic cholecystitis. I will schedule her for cholecystectomy at the first available opportunity. I also discussed that some of her GI symptoms may improve or resolve after cholecystectomy. The main symptoms we are targeting our nausea  and pain. However, many patients see that some of their reflux, bloating, and belching improves after getting her gallbladder out. I encouraged her to hold off on dramatically changing her medication regimen until after cholecystectomy.  The surgical procedure was described to the patient in detail. The patient was given educational material. I discussed the incision type and location, the location of the gallbladder, the anatomy of the bile ducts and arteries, and the typical progression of surgery. I discussed the possibility of converting to an open operation. I advised of the risks of bleeding, infection, damage to other structures (such as the bile duct, intestine or liver), bile leak, need for other procedures or surgeries, and post op diarrhea/constipation. We discussed the risk of blood clot. We discussed the recovery period and post operative restrictions. The patient was advised against taking blood thinners the week before surgery. Current Plans Pt Education - Laparoscopic Cholecystectomy: gallbladder You are being scheduled for surgery- Our schedulers will call you.  You should hear from our office's scheduling department within 5 working days about the location, date, and time of surgery. We try to make accommodations for patient's preferences in scheduling surgery, but sometimes the OR schedule or the surgeon's schedule prevents [place] from making those accommodations.  If you have not heard from our office [phone]) in 5 working days, call the office and ask for your  surgeon's nurse.  If you have other questions about your diagnosis, plan, or surgery, call the office and ask for your surgeon's nurse.    Signed by Stark Klein, MD (10/13/2016 2:53 PM)

## 2016-10-20 ENCOUNTER — Encounter (HOSPITAL_COMMUNITY): Payer: Self-pay | Admitting: General Surgery

## 2016-10-25 ENCOUNTER — Encounter: Payer: Self-pay | Admitting: Family Medicine

## 2016-11-09 ENCOUNTER — Other Ambulatory Visit: Payer: Self-pay | Admitting: Family Medicine

## 2016-11-29 ENCOUNTER — Telehealth: Payer: Self-pay

## 2016-11-29 NOTE — Telephone Encounter (Signed)
[place] with [facility] left v/m wanting to know if chlordiazepoxide($30.00 copay for pt) could be substituted for Librax namebrand ($100.00 copay). Request cb using ref # [id number].

## 2016-11-30 NOTE — Telephone Encounter (Signed)
Spoke to [place] and gave order for generic

## 2016-11-30 NOTE — Telephone Encounter (Signed)
Ok to substitute. plz notify pharmacy.

## 2016-12-05 DIAGNOSIS — S9032XA Contusion of left foot, initial encounter: Secondary | ICD-10-CM | POA: Diagnosis not present

## 2016-12-05 DIAGNOSIS — S92902A Unspecified fracture of left foot, initial encounter for closed fracture: Secondary | ICD-10-CM | POA: Diagnosis not present

## 2016-12-05 DIAGNOSIS — S99922A Unspecified injury of left foot, initial encounter: Secondary | ICD-10-CM | POA: Diagnosis not present

## 2016-12-05 DIAGNOSIS — S9002XA Contusion of left ankle, initial encounter: Secondary | ICD-10-CM | POA: Diagnosis not present

## 2016-12-19 DIAGNOSIS — S92155A Nondisplaced avulsion fracture (chip fracture) of left talus, initial encounter for closed fracture: Secondary | ICD-10-CM | POA: Diagnosis not present

## 2016-12-19 DIAGNOSIS — S92035A Nondisplaced avulsion fracture of tuberosity of left calcaneus, initial encounter for closed fracture: Secondary | ICD-10-CM | POA: Diagnosis not present

## 2016-12-19 DIAGNOSIS — M79672 Pain in left foot: Secondary | ICD-10-CM | POA: Diagnosis not present

## 2017-01-02 ENCOUNTER — Encounter: Payer: Self-pay | Admitting: Family Medicine

## 2017-01-04 MED ORDER — MELOXICAM 7.5 MG PO TABS: 7.5000 mg | ORAL_TABLET | Freq: Every day | ORAL | 1 refills | Status: DC | PRN

## 2017-01-16 DIAGNOSIS — S92155D Nondisplaced avulsion fracture (chip fracture) of left talus, subsequent encounter for fracture with routine healing: Secondary | ICD-10-CM | POA: Diagnosis not present

## 2017-01-16 DIAGNOSIS — S92035D Nondisplaced avulsion fracture of tuberosity of left calcaneus, subsequent encounter for fracture with routine healing: Secondary | ICD-10-CM | POA: Diagnosis not present

## 2017-01-24 ENCOUNTER — Telehealth: Payer: Self-pay | Admitting: Family Medicine

## 2017-01-24 DIAGNOSIS — K52839 Microscopic colitis, unspecified: Secondary | ICD-10-CM

## 2017-01-24 DIAGNOSIS — N951 Menopausal and female climacteric states: Secondary | ICD-10-CM

## 2017-01-24 DIAGNOSIS — I10 Essential (primary) hypertension: Secondary | ICD-10-CM

## 2017-01-24 DIAGNOSIS — R7989 Other specified abnormal findings of blood chemistry: Secondary | ICD-10-CM

## 2017-01-24 NOTE — Telephone Encounter (Signed)
Pt has cpx 03/01/17 and wanted to know if she could have her thyroid check and she wanted to know if she was  menopausal labs on 02/20/17

## 2017-01-25 NOTE — Telephone Encounter (Signed)
Ok to do this - would have her remind [place] when blood drawn to get these labs as well.

## 2017-01-25 NOTE — Telephone Encounter (Signed)
Dr Danise Mina, please put future orders in for CPX, THY, and hormone tests that the patient is requesting. Thanks, T

## 2017-01-25 NOTE — Telephone Encounter (Signed)
Terri, does he need to go ahead and place the order for this?

## 2017-01-27 NOTE — Telephone Encounter (Signed)
Ordered

## 2017-02-07 ENCOUNTER — Other Ambulatory Visit: Payer: Self-pay | Admitting: Family Medicine

## 2017-02-07 NOTE — Telephone Encounter (Signed)
plz phone in - e prescribing error.

## 2017-02-08 MED ORDER — HYDROCHLOROTHIAZIDE 12.5 MG PO CAPS: 12.5000 mg | ORAL_CAPSULE | Freq: Every day | ORAL | 1 refills | Status: DC

## 2017-02-08 NOTE — Addendum Note (Signed)
Addended by: Modena Nunnery on: 02/08/2017 09:06 AM   Modules accepted: Orders

## 2017-02-08 NOTE — Telephone Encounter (Signed)
Rx faxed to mail order 

## 2017-02-20 ENCOUNTER — Other Ambulatory Visit (INDEPENDENT_AMBULATORY_CARE_PROVIDER_SITE_OTHER): Payer: BLUE CROSS/BLUE SHIELD

## 2017-02-20 DIAGNOSIS — R7989 Other specified abnormal findings of blood chemistry: Secondary | ICD-10-CM | POA: Diagnosis not present

## 2017-02-20 DIAGNOSIS — K52839 Microscopic colitis, unspecified: Secondary | ICD-10-CM | POA: Diagnosis not present

## 2017-02-20 DIAGNOSIS — N951 Menopausal and female climacteric states: Secondary | ICD-10-CM

## 2017-02-20 DIAGNOSIS — I10 Essential (primary) hypertension: Secondary | ICD-10-CM

## 2017-02-20 LAB — BASIC METABOLIC PANEL
BUN: 10 mg/dL (ref 6–23)
CHLORIDE: 100 meq/L (ref 96–112)
CO2: 34 meq/L — AB (ref 19–32)
CREATININE: 0.81 mg/dL (ref 0.40–1.20)
Calcium: 9.7 mg/dL (ref 8.4–10.5)
GFR: 77.67 mL/min (ref >60.00)
GLUCOSE: 93 mg/dL (ref 70–99)
Potassium: 4.3 mEq/L (ref 3.5–5.1)
Sodium: 140 mEq/L (ref 135–145)

## 2017-02-20 LAB — FOLLICLE STIMULATING HORMONE: FSH: 62.8 m[IU]/mL

## 2017-02-20 LAB — LIPID PANEL
CHOL/HDL RATIO: 2
Cholesterol: 213 mg/dL — ABNORMAL HIGH (ref 0–200)
HDL: 115.7 mg/dL (ref >39.00)
LDL Cholesterol: 80 mg/dL (ref 0–99)
NonHDL: 97.25
TRIGLYCERIDES: 88 mg/dL (ref 0.0–149.0)
VLDL: 17.6 mg/dL (ref 0.0–40.0)

## 2017-02-20 LAB — TSH: TSH: 2.08 u[IU]/mL (ref 0.35–4.50)

## 2017-02-20 LAB — VITAMIN D 25 HYDROXY (VIT D DEFICIENCY, FRACTURES): VITD: 40.05 ng/mL (ref 30.00–100.00)

## 2017-02-26 ENCOUNTER — Other Ambulatory Visit: Payer: Self-pay | Admitting: Family Medicine

## 2017-02-27 DIAGNOSIS — Z01419 Encounter for gynecological examination (general) (routine) without abnormal findings: Secondary | ICD-10-CM | POA: Diagnosis not present

## 2017-02-27 DIAGNOSIS — Z6824 Body mass index (BMI) 24.0-24.9, adult: Secondary | ICD-10-CM | POA: Diagnosis not present

## 2017-02-27 DIAGNOSIS — Z1231 Encounter for screening mammogram for malignant neoplasm of breast: Secondary | ICD-10-CM | POA: Diagnosis not present

## 2017-02-27 LAB — HM MAMMOGRAPHY

## 2017-03-01 ENCOUNTER — Ambulatory Visit (INDEPENDENT_AMBULATORY_CARE_PROVIDER_SITE_OTHER): Payer: BLUE CROSS/BLUE SHIELD | Admitting: Family Medicine

## 2017-03-01 ENCOUNTER — Encounter: Payer: Self-pay | Admitting: Family Medicine

## 2017-03-01 VITALS — BP 118/70 | HR 64 | Temp 98.0°F | Wt 143.5 lb

## 2017-03-01 DIAGNOSIS — Z23 Encounter for immunization: Secondary | ICD-10-CM

## 2017-03-01 DIAGNOSIS — K589 Irritable bowel syndrome without diarrhea: Secondary | ICD-10-CM | POA: Diagnosis not present

## 2017-03-01 DIAGNOSIS — Z0000 Encounter for general adult medical examination without abnormal findings: Secondary | ICD-10-CM | POA: Diagnosis not present

## 2017-03-01 DIAGNOSIS — K52839 Microscopic colitis, unspecified: Secondary | ICD-10-CM | POA: Diagnosis not present

## 2017-03-01 DIAGNOSIS — R7989 Other specified abnormal findings of blood chemistry: Secondary | ICD-10-CM

## 2017-03-01 DIAGNOSIS — F418 Other specified anxiety disorders: Secondary | ICD-10-CM

## 2017-03-01 DIAGNOSIS — I10 Essential (primary) hypertension: Secondary | ICD-10-CM | POA: Diagnosis not present

## 2017-03-01 NOTE — Assessment & Plan Note (Signed)
Chronic, stable. She is trying to come off lorazepam. Discussed hydroxyzine use.

## 2017-03-01 NOTE — Assessment & Plan Note (Signed)
HLD driven by high HDL.  The ASCVD Risk score Mikey Bussing DC Jr., et al., 2013) failed to calculate for the following reasons:   The valid HDL cholesterol range is 20 to 100 mg/dL

## 2017-03-01 NOTE — Patient Instructions (Addendum)
Flu shot today You are doing well today. I do recommend establishing regular exercise routine. Continue healthy diet choices for goal weight loss. Weight is overall ok.  Return as needed or in 1 year for next physical.   Health Maintenance, Female Adopting a healthy lifestyle and getting preventive care can go a long way to promote health and wellness. Talk with your health care provider about what schedule of regular examinations is right for you. This is a good chance for you to check in with your provider about disease prevention and staying healthy. In between checkups, there are plenty of things you can do on your own. Experts have done a lot of research about which lifestyle changes and preventive measures are most likely to keep you healthy. Ask your health care provider for more information. Weight and diet Eat a healthy diet  Be sure to include plenty of vegetables, fruits, low-fat dairy products, and lean protein.  Do not eat a lot of foods high in solid fats, added sugars, or salt.  Get regular exercise. This is one of the most important things you can do for your health. ? Most adults should exercise for at least 150 minutes each week. The exercise should increase your heart rate and make you sweat (moderate-intensity exercise). ? Most adults should also do strengthening exercises at least twice a week. This is in addition to the moderate-intensity exercise.  Maintain a healthy weight  Body mass index (BMI) is a measurement that can be used to identify possible weight problems. It estimates body fat based on height and weight. Your health care provider can help determine your BMI and help you achieve or maintain a healthy weight.  For females 7 years of age and older: ? A BMI below 18.5 is considered underweight. ? A BMI of 18.5 to 24.9 is normal. ? A BMI of 25 to 29.9 is considered overweight. ? A BMI of 30 and above is considered obese.  Watch levels of cholesterol and  blood lipids  You should start having your blood tested for lipids and cholesterol at 56 years of age, then have this test every 5 years.  You may need to have your cholesterol levels checked more often if: ? Your lipid or cholesterol levels are high. ? You are older than 56 years of age. ? You are at high risk for heart disease.  Cancer screening Lung Cancer  Lung cancer screening is recommended for adults 10-38 years old who are at high risk for lung cancer because of a history of smoking.  A yearly low-dose CT scan of the lungs is recommended for people who: ? Currently smoke. ? Have quit within the past 15 years. ? Have at least a 30-pack-year history of smoking. A pack year is smoking an average of one pack of cigarettes a day for 1 year.  Yearly screening should continue until it has been 15 years since you quit.  Yearly screening should stop if you develop a health problem that would prevent you from having lung cancer treatment.  Breast Cancer  Practice breast self-awareness. This means understanding how your breasts normally appear and feel.  It also means doing regular breast self-exams. Let your health care provider know about any changes, no matter how small.  If you are in your 20s or 30s, you should have a clinical breast exam (CBE) by a health care provider every 1-3 years as part of a regular health exam.  If you are 40 or older,  have a CBE every year. Also consider having a breast X-ray (mammogram) every year.  If you have a family history of breast cancer, talk to your health care provider about genetic screening.  If you are at high risk for breast cancer, talk to your health care provider about having an MRI and a mammogram every year.  Breast cancer gene (BRCA) assessment is recommended for women who have family members with BRCA-related cancers. BRCA-related cancers include: ? Breast. ? Ovarian. ? Tubal. ? Peritoneal cancers.  Results of the assessment  will determine the need for genetic counseling and BRCA1 and BRCA2 testing.  Cervical Cancer Your health care provider may recommend that you be screened regularly for cancer of the pelvic organs (ovaries, uterus, and vagina). This screening involves a pelvic examination, including checking for microscopic changes to the surface of your cervix (Pap test). You may be encouraged to have this screening done every 3 years, beginning at age 21.  For women ages 30-65, health care providers may recommend pelvic exams and Pap testing every 3 years, or they may recommend the Pap and pelvic exam, combined with testing for human papilloma virus (HPV), every 5 years. Some types of HPV increase your risk of cervical cancer. Testing for HPV may also be done on women of any age with unclear Pap test results.  Other health care providers may not recommend any screening for nonpregnant women who are considered low risk for pelvic cancer and who do not have symptoms. Ask your health care provider if a screening pelvic exam is right for you.  If you have had past treatment for cervical cancer or a condition that could lead to cancer, you need Pap tests and screening for cancer for at least 20 years after your treatment. If Pap tests have been discontinued, your risk factors (such as having a new sexual partner) need to be reassessed to determine if screening should resume. Some women have medical problems that increase the chance of getting cervical cancer. In these cases, your health care provider may recommend more frequent screening and Pap tests.  Colorectal Cancer  This type of cancer can be detected and often prevented.  Routine colorectal cancer screening usually begins at 56 years of age and continues through 56 years of age.  Your health care provider may recommend screening at an earlier age if you have risk factors for colon cancer.  Your health care provider may also recommend using home test kits to  check for hidden blood in the stool.  A small camera at the end of a tube can be used to examine your colon directly (sigmoidoscopy or colonoscopy). This is done to check for the earliest forms of colorectal cancer.  Routine screening usually begins at age 50.  Direct examination of the colon should be repeated every 5-10 years through 56 years of age. However, you may need to be screened more often if early forms of precancerous polyps or small growths are found.  Skin Cancer  Check your skin from head to toe regularly.  Tell your health care provider about any new moles or changes in moles, especially if there is a change in a mole's shape or color.  Also tell your health care provider if you have a mole that is larger than the size of a pencil eraser.  Always use sunscreen. Apply sunscreen liberally and repeatedly throughout the day.  Protect yourself by wearing long sleeves, pants, a wide-brimmed hat, and sunglasses whenever you are outside.    Heart disease, diabetes, and high blood pressure  High blood pressure causes heart disease and increases the risk of stroke. High blood pressure is more likely to develop in: ? People who have blood pressure in the high end of the normal range (130-139/85-89 mm Hg). ? People who are overweight or obese. ? People who are African American.  If you are 4-70 years of age, have your blood pressure checked every 3-5 years. If you are 38 years of age or older, have your blood pressure checked every year. You should have your blood pressure measured twice-once when you are at a hospital or clinic, and once when you are not at a hospital or clinic. Record the average of the two measurements. To check your blood pressure when you are not at a hospital or clinic, you can use: ? An automated blood pressure machine at a pharmacy. ? A home blood pressure monitor.  If you are between 34 years and 35 years old, ask your health care provider if you should  take aspirin to prevent strokes.  Have regular diabetes screenings. This involves taking a blood sample to check your fasting blood sugar level. ? If you are at a normal weight and have a low risk for diabetes, have this test once every three years after 56 years of age. ? If you are overweight and have a high risk for diabetes, consider being tested at a younger age or more often. Preventing infection Hepatitis B  If you have a higher risk for hepatitis B, you should be screened for this virus. You are considered at high risk for hepatitis B if: ? You were born in a country where hepatitis B is common. Ask your health care provider which countries are considered high risk. ? Your parents were born in a high-risk country, and you have not been immunized against hepatitis B (hepatitis B vaccine). ? You have HIV or AIDS. ? You use needles to inject street drugs. ? You live with someone who has hepatitis B. ? You have had sex with someone who has hepatitis B. ? You get hemodialysis treatment. ? You take certain medicines for conditions, including cancer, organ transplantation, and autoimmune conditions.  Hepatitis C  Blood testing is recommended for: ? Everyone born from [birth year] through 1965. ? Anyone with known risk factors for hepatitis C.  Sexually transmitted infections (STIs)  You should be screened for sexually transmitted infections (STIs) including gonorrhea and chlamydia if: ? You are sexually active and are younger than 56 years of age. ? You are older than 56 years of age and your health care provider tells you that you are at risk for this type of infection. ? Your sexual activity has changed since you were last screened and you are at an increased risk for chlamydia or gonorrhea. Ask your health care provider if you are at risk.  If you do not have HIV, but are at risk, it may be recommended that you take a prescription medicine daily to prevent HIV infection. This is called  pre-exposure prophylaxis (PrEP). You are considered at risk if: ? You are sexually active and do not regularly use condoms or know the HIV status of your partner(s). ? You take drugs by injection. ? You are sexually active with a partner who has HIV.  Talk with your health care provider about whether you are at high risk of being infected with HIV. If you choose to begin PrEP, you should first be tested for HIV.  You should then be tested every 3 months for as long as you are taking PrEP. Pregnancy  If you are premenopausal and you may become pregnant, ask your health care provider about preconception counseling.  If you may become pregnant, take 400 to 800 micrograms (mcg) of folic acid every day.  If you want to prevent pregnancy, talk to your health care provider about birth control (contraception). Osteoporosis and menopause  Osteoporosis is a disease in which the bones lose minerals and strength with aging. This can result in serious bone fractures. Your risk for osteoporosis can be identified using a bone density scan.  If you are 65 years of age or older, or if you are at risk for osteoporosis and fractures, ask your health care provider if you should be screened.  Ask your health care provider whether you should take a calcium or vitamin D supplement to lower your risk for osteoporosis.  Menopause may have certain physical symptoms and risks.  Hormone replacement therapy may reduce some of these symptoms and risks. Talk to your health care provider about whether hormone replacement therapy is right for you. Follow these instructions at home:  Schedule regular health, dental, and eye exams.  Stay current with your immunizations.  Do not use any tobacco products including cigarettes, chewing tobacco, or electronic cigarettes.  If you are pregnant, do not drink alcohol.  If you are breastfeeding, limit how much and how often you drink alcohol.  Limit alcohol intake to no more  than 1 drink per day for nonpregnant women. One drink equals 12 ounces of beer, 5 ounces of wine, or 1 ounces of hard liquor.  Do not use street drugs.  Do not share needles.  Ask your health care provider for help if you need support or information about quitting drugs.  Tell your health care provider if you often feel depressed.  Tell your health care provider if you have ever been abused or do not feel safe at home. This information is not intended to replace advice given to you by your health care provider. Make sure you discuss any questions you have with your health care provider. Document Released: 12/06/2010 Document Revised: 10/29/2015 Document Reviewed: 02/24/2015 Elsevier Interactive Patient Education  2018 [employer]  

## 2017-03-01 NOTE — Assessment & Plan Note (Signed)
Chronic, stable. Continue hctz.  

## 2017-03-01 NOTE — Progress Notes (Signed)
BP 118/70 (BP Location: Left Arm, Patient Position: Sitting, Cuff Size: Normal)   Pulse 64   Temp 98 F (36.7 C) (Oral)   Wt 143 lb 8 oz (65.1 kg)   SpO2 98%   BMI 23.88 kg/m    CC: CPE Subjective:    Patient ID: Elizabeth Long, female    DOB: 26-Apr-1961, 56 y.o.   MRN: [id number]  HPI: Elizabeth Long is a 56 y.o. female presenting on 03/01/2017 for Annual Exam   Preventative: Colonoscopy 09/2016 - 87mm TA, rpt 3 yrs (Nandigam)  Well woman with OBGYN Dr Stann Mainland 02/2017, s/p hysterectomy (endometriosis). Ovaries remain. Seeing next week.  Mammogram 02/2016 - yearly, dense breasts s/p 3D mammogram. She had mammo on Monday.  Flu shot at work Tetanus - unsure >10 yrs ago. Tdap 02/2106 Seat belt use discussed Sunscreen use discussed. No changing moles on skin.  Ex smoker - quit 2001 Alcohol use - none - sober since 1994.  Daily caffeine 3-4 cups Lives alone BF has MS - broke up with BF 2018. 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 Interim medical history since our last visit reviewed. Allergies and medications reviewed and updated. Outpatient Medications Prior to Visit  Medication Sig Dispense Refill  . cetirizine (ZYRTEC) 10 MG tablet Take 10 mg by mouth daily as needed for allergies.    . Cholecalciferol (VITAMIN D) 2000 units CAPS Take 2,000 Units by mouth daily.    . hydrochlorothiazide (MICROZIDE) 12.5 MG capsule Take 1 capsule (12.5 mg total) by mouth daily. 90 capsule 1  . hydrOXYzine (ATARAX/VISTARIL) 50 MG tablet Take 1 tablet (50 mg total) by mouth at bedtime as needed. (Patient taking differently: Take 50 mg by mouth at bedtime. ) 90 tablet 3  . meloxicam (MOBIC) 7.5 MG tablet TAKE 1 TABLET (7.5 MG TOTAL) BY MOUTH DAILY AS NEEDED FOR PAIN. 30 tablet 0  . omeprazole (PRILOSEC) 20 MG  capsule Take 1 capsule (20 mg total) by mouth daily. One tablet by mouth once daily (Patient taking differently: Take 20 mg by mouth daily. ) 90 capsule 1  . oxyCODONE (OXY IR/ROXICODONE) 5 MG immediate release tablet Take 1-2 tablets (5-10 mg total) by mouth every 6 (six) hours as needed for moderate pain, severe pain or breakthrough pain. 30 tablet 0  . Potassium 99 MG TABS Take 99 mg by mouth daily.     Marland Kitchen pyridOXINE (VITAMIN B-6) 100 MG tablet Take 100 mg by mouth daily.    . traMADol (ULTRAM) 50 MG tablet Take 50 mg by mouth at bedtime as needed for moderate pain.     Marland Kitchen venlafaxine XR (EFFEXOR-XR) 150 MG 24 hr capsule Take 1 capsule (150 mg total) by mouth daily. (Patient taking differently: Take 150 mg by mouth at bedtime. ) 90 capsule 3  . clidinium-chlordiazePOXIDE (LIBRAX) 5-2.5 MG capsule Take 1 capsule by mouth every morning. (Patient not taking: Reported on 03/01/2017) 90 capsule 1  . LORazepam (ATIVAN) 2 MG tablet Take 0.5-1 tablets (1-2 mg total) by mouth at bedtime as needed for anxiety. (Patient not taking: Reported on 03/01/2017) 30 tablet 0  . acetaminophen (TYLENOL) 500 MG tablet Take 1,000 mg by mouth 2 (two) times daily as needed for moderate pain or headache.    . linaclotide (LINZESS) 72 MCG capsule Take 1 capsule (72 mcg total) by  mouth daily before breakfast. 30 capsule 6   No facility-administered medications prior to visit.      Per HPI unless specifically indicated in ROS section below Review of Systems  Constitutional: Negative for activity change, appetite change, chills, fatigue, fever and unexpected weight change.  HENT: Negative for hearing loss.   Eyes: Negative for visual disturbance.  Respiratory: Negative for cough, chest tightness, shortness of breath and wheezing.   Cardiovascular: Negative for chest pain, palpitations and leg swelling.  Gastrointestinal: Negative for abdominal distention, abdominal pain, blood in stool, constipation, diarrhea, nausea and  vomiting.  Genitourinary: Negative for difficulty urinating and hematuria.  Musculoskeletal: Negative for arthralgias, myalgias and neck pain.  Skin: Negative for rash.  Neurological: Negative for dizziness, seizures, syncope and headaches.  Hematological: Negative for adenopathy. Does not bruise/bleed easily.  Psychiatric/Behavioral: Negative for dysphoric mood. The patient is nervous/anxious.        Objective:    BP 118/70 (BP Location: Left Arm, Patient Position: Sitting, Cuff Size: Normal)   Pulse 64   Temp 98 F (36.7 C) (Oral)   Wt 143 lb 8 oz (65.1 kg)   SpO2 98%   BMI 23.88 kg/m   Wt Readings from Last 3 Encounters:  03/01/17 143 lb 8 oz (65.1 kg)  10/19/16 137 lb (62.1 kg)  09/30/16 140 lb (63.5 kg)    Physical Exam  Constitutional: She is oriented to person, place, and time. She appears well-developed and well-nourished. No distress.  HENT:  Head: Normocephalic and atraumatic.  Right Ear: Hearing, tympanic membrane, external ear and ear canal normal.  Left Ear: Hearing, tympanic membrane, external ear and ear canal normal.  Nose: Nose normal.  Mouth/Throat: Uvula is midline, oropharynx is clear and moist and mucous membranes are normal. No oropharyngeal exudate, posterior oropharyngeal edema or posterior oropharyngeal erythema.  Eyes: Pupils are equal, round, and reactive to light. Conjunctivae and EOM are normal. No scleral icterus.  Neck: Normal range of motion. Neck supple. No thyromegaly present.  Cardiovascular: Normal rate, regular rhythm, normal heart sounds and intact distal pulses.   No murmur heard. Pulses:      Radial pulses are 2+ on the right side, and 2+ on the left side.  Pulmonary/Chest: Effort normal and breath sounds normal. No respiratory distress. She has no wheezes. She has no rales.  Abdominal: Soft. Bowel sounds are normal. She exhibits no distension and no mass. There is no tenderness. There is no rebound and no guarding.  Musculoskeletal:  Normal range of motion. She exhibits no edema.  Lymphadenopathy:    She has no cervical adenopathy.  Neurological: She is alert and oriented to person, place, and time.  CN grossly intact, station and gait intact  Skin: Skin is warm and dry. No rash noted.  Psychiatric: She has a normal mood and affect. Her behavior is normal. Judgment and thought content normal.  Nursing note and vitals reviewed.  Results for orders placed or performed in visit on 02/20/17  Lipid panel  Result Value Ref Range   Cholesterol 213 (H) 0 - 200 mg/dL   Triglycerides 88.0 0.0 - 149.0 mg/dL   HDL 115.70 >39.00 mg/dL   VLDL 17.6 0.0 - 40.0 mg/dL   LDL Cholesterol 80 0 - 99 mg/dL   Total CHOL/HDL Ratio 2    NonHDL 78.29   Basic metabolic panel  Result Value Ref Range   Sodium 140 135 - 145 mEq/L   Potassium 4.3 3.5 - 5.1 mEq/L   Chloride 100 96 -  112 mEq/L   CO2 34 (H) 19 - 32 mEq/L   Glucose, Bld 93 70 - 99 mg/dL   BUN 10 6 - 23 mg/dL   Creatinine, Ser 0.81 0.40 - 1.20 mg/dL   Calcium 9.7 8.4 - 10.5 mg/dL   GFR 77.67 >60.00 mL/min  TSH  Result Value Ref Range   TSH 2.08 0.35 - 4.50 uIU/mL  VITAMIN D 25 Hydroxy (Vit-D Deficiency, Fractures)  Result Value Ref Range   VITD 40.05 30.00 - 638.75 ng/mL  Follicle Stimulating Hormone  Result Value Ref Range   FSH 62.8 mIU/ML      Assessment & Plan:  Phentermine recently started for weight loss. Discussed with current BMI she is really not a candidate for weight loss medication. Reviewed detrimental side effects of stimulant, rec she stop this and just focus on healthy diet and lifestyle choices.  Problem List Items Addressed This Visit    Anxiety associated with depression    Chronic, stable. She is trying to come off lorazepam. Discussed hydroxyzine use.      Essential hypertension    Chronic, stable. Continue hctz.       Health maintenance examination - Primary    Preventative protocols reviewed and updated unless pt declined. Discussed  healthy diet and lifestyle.       High serum high density lipoprotein (HDL)    HLD driven by high HDL.  The ASCVD Risk score Mikey Bussing DC Jr., et al., 2013) failed to calculate for the following reasons:   The valid HDL cholesterol range is 20 to 100 mg/dL       Irritable bowel syndrome (IBS)   Microscopic colitis    Appreciate GI care.           Follow up plan: Return in about 1 year (around 03/01/2018) for annual exam, prior fasting for blood work.  Ria Bush, MD

## 2017-03-01 NOTE — Assessment & Plan Note (Signed)
Preventative protocols reviewed and updated unless pt declined. Discussed healthy diet and lifestyle.  

## 2017-03-01 NOTE — Assessment & Plan Note (Signed)
Appreciate GI care.  

## 2017-03-02 NOTE — Addendum Note (Signed)
Addended by: Brenton Grills on: 3/57/8978 47:84 PM   Modules accepted: Orders

## 2017-03-08 ENCOUNTER — Encounter: Payer: Self-pay | Admitting: Family Medicine

## 2017-03-12 ENCOUNTER — Encounter: Payer: Self-pay | Admitting: Family Medicine

## 2017-03-20 ENCOUNTER — Encounter: Payer: Self-pay | Admitting: Family Medicine

## 2017-03-20 ENCOUNTER — Ambulatory Visit (INDEPENDENT_AMBULATORY_CARE_PROVIDER_SITE_OTHER): Payer: BLUE CROSS/BLUE SHIELD | Admitting: Family Medicine

## 2017-03-20 ENCOUNTER — Ambulatory Visit (INDEPENDENT_AMBULATORY_CARE_PROVIDER_SITE_OTHER)
Admission: RE | Admit: 2017-03-20 | Discharge: 2017-03-20 | Disposition: A | Discharge status: Home / Self Care | Payer: BLUE CROSS/BLUE SHIELD | Source: Ambulatory Visit | Attending: Family Medicine | Admitting: Family Medicine

## 2017-03-20 VITALS — BP 120/68 | HR 64 | Temp 98.2°F | Wt 144.5 lb

## 2017-03-20 DIAGNOSIS — M5412 Radiculopathy, cervical region: Secondary | ICD-10-CM

## 2017-03-20 DIAGNOSIS — M542 Cervicalgia: Secondary | ICD-10-CM | POA: Diagnosis not present

## 2017-03-20 MED ORDER — GABAPENTIN 100 MG PO CAPS: ORAL_CAPSULE | ORAL | 3 refills | Status: DC

## 2017-03-20 MED ORDER — PREDNISONE 20 MG PO TABS: ORAL_TABLET | ORAL | 0 refills | Status: DC

## 2017-03-20 NOTE — Patient Instructions (Signed)
Cervical neck xray today.  I do think you have worsening neck pain possibly arthritis related, possible radiculopathy from pinched nerve (with arm symptoms). Treat with prednisone course and gabapentin 100mg  nightly for 3 days then increase to twice daily for 3 days, up to three times daily as tolerated.  Hold meloxicam while you're on prednisone.  Update [place] if no improvement with gabapentin and prednisone course.

## 2017-03-20 NOTE — Assessment & Plan Note (Signed)
Known cervical HNP with radiculopathy s/p ACDF 2006. Now over the last several months to a year endorses worsening neck pain and R arm paresthesias concerning for return of radiculopathy. Check cervical films today and treat with prednisone and gabapentin course. If worsening, low threshold to advanced image with MRI and refer back to neurosurgery. Provided at this time with cervical neck stretching exercises. Pt agrees with plan.

## 2017-03-20 NOTE — Progress Notes (Signed)
BP 120/68 (BP Location: Left Arm, Patient Position: Sitting, Cuff Size: Normal)   Pulse 64   Temp 98.2 F (36.8 C) (Oral)   Wt 144 lb 8 oz (65.5 kg)   SpO2 99%   BMI 24.05 kg/m    CC: worsening neck pain Subjective:    Patient ID: Elizabeth Long, female    DOB: Jun 16, 1960, 56 y.o.   MRN: [id number]  HPI: Elizabeth Long is a 56 y.o. female presenting on 03/20/2017 for Neck Pain (Has bone spurs. Pain has worsened in last few months. Sometimes radiates down right arm)   Elizabeth Long presents today with several month history of progressively worsening R sided neck pain without radiation but she does have paresthesias and numbness down right arm from anterior shoulder to tips of fingers. Now has constant pain of R side of neck. Denies inciting trauma/injury. Denies weakness of R arm. No pain or numbness of left arm.   Pain regimen includes meloxicam 7.5mg  daily and tramadol 50mg  PRN (not helping). Previously gabapentin has not helped.   Chronic neck pain, endorses known bone spurs s/p ACDF 2006 by Dr Christella Noa for C5/6 HNP. She has also had R rotator cuff repair.   Relevant past medical, surgical, family and social history reviewed and updated as indicated. Interim medical history since our last visit reviewed. Allergies and medications reviewed and updated. Outpatient Medications Prior to Visit  Medication Sig Dispense Refill  . cetirizine (ZYRTEC) 10 MG tablet Take 10 mg by mouth daily as needed for allergies.    . Cholecalciferol (VITAMIN D) 2000 units CAPS Take 2,000 Units by mouth daily.    . clidinium-chlordiazePOXIDE (LIBRAX) 5-2.5 MG capsule Take 1 capsule by mouth every morning. (Patient not taking: Reported on 03/01/2017) 90 capsule 1  . estradiol (ESTRACE) 2 MG tablet Take 2 mg by mouth daily.    . hydrochlorothiazide (MICROZIDE) 12.5 MG capsule Take 1 capsule (12.5 mg total) by mouth daily. 90 capsule 1  . hydrOXYzine (ATARAX/VISTARIL) 50 MG tablet Take 1 tablet (50 mg total) by mouth  at bedtime as needed. (Patient taking differently: Take 50 mg by mouth at bedtime. ) 90 tablet 3  . meloxicam (MOBIC) 7.5 MG tablet TAKE 1 TABLET (7.5 MG TOTAL) BY MOUTH DAILY AS NEEDED FOR PAIN. 30 tablet 0  . omeprazole (PRILOSEC) 20 MG capsule Take 1 capsule (20 mg total) by mouth daily. One tablet by mouth once daily (Patient taking differently: Take 20 mg by mouth daily. ) 90 capsule 1  . oxyCODONE (OXY IR/ROXICODONE) 5 MG immediate release tablet Take 1-2 tablets (5-10 mg total) by mouth every 6 (six) hours as needed for moderate pain, severe pain or breakthrough pain. 30 tablet 0  . phentermine 37.5 MG capsule Take 37.5 mg by mouth daily before breakfast.    . Potassium 99 MG TABS Take 99 mg by mouth daily.     Marland Kitchen pyridOXINE (VITAMIN B-6) 100 MG tablet Take 100 mg by mouth daily.    . traMADol (ULTRAM) 50 MG tablet Take 50 mg by mouth at bedtime as needed for moderate pain.     Marland Kitchen venlafaxine XR (EFFEXOR-XR) 150 MG 24 hr capsule Take 1 capsule (150 mg total) by mouth daily. (Patient taking differently: Take 150 mg by mouth at bedtime. ) 90 capsule 3   No facility-administered medications prior to visit.      Per HPI unless specifically indicated in ROS section below Review of Systems     Objective:    BP  120/68 (BP Location: Left Arm, Patient Position: Sitting, Cuff Size: Normal)   Pulse 64   Temp 98.2 F (36.8 C) (Oral)   Wt 144 lb 8 oz (65.5 kg)   SpO2 99%   BMI 24.05 kg/m   Wt Readings from Last 3 Encounters:  03/20/17 144 lb 8 oz (65.5 kg)  03/01/17 143 lb 8 oz (65.1 kg)  10/19/16 137 lb (62.1 kg)    Physical Exam  Constitutional: She appears well-developed and well-nourished. No distress.  Musculoskeletal: Normal range of motion. She exhibits no edema.  FROM at shoulders, limited forward flexion/extension at neck, otherwise FROM Discomfort to palpation R paraspinous cervical mm  Neurological: She has normal strength. No sensory deficit.  Reflex Scores:      Bicep  reflexes are 2+ on the right side and 2+ on the left side. 5/5 strength BUE Grip strength intact  Nursing note and vitals reviewed.     Assessment & Plan:   Problem List Items Addressed This Visit    Cervical radiculopathy - Primary    Known cervical HNP with radiculopathy s/p ACDF 2006. Now over the last several months to a year endorses worsening neck pain and R arm paresthesias concerning for return of radiculopathy. Check cervical films today and treat with prednisone and gabapentin course. If worsening, low threshold to advanced image with MRI and refer back to neurosurgery. Provided at this time with cervical neck stretching exercises. Pt agrees with plan.       Relevant Medications   gabapentin (NEURONTIN) 100 MG capsule   Other Relevant Orders   DG Cervical Spine Complete       Follow up plan: Return if symptoms worsen or fail to improve.  Ria Bush, MD

## 2017-03-28 ENCOUNTER — Other Ambulatory Visit: Payer: Self-pay | Admitting: Family Medicine

## 2017-03-30 ENCOUNTER — Encounter: Payer: Self-pay | Admitting: Family Medicine

## 2017-04-07 ENCOUNTER — Encounter: Payer: Self-pay | Admitting: *Deleted

## 2017-04-07 ENCOUNTER — Telehealth: Payer: Self-pay | Admitting: Family Medicine

## 2017-04-07 NOTE — Telephone Encounter (Signed)
Pt calling to get results of xray taken of the neck.  Results not released for [facility] nurse to give to pt. Pt wants to know if she will have to have surgery before the end of the year.

## 2017-04-07 NOTE — Telephone Encounter (Signed)
Spoke with pt notifying pt Dr. Darnell Level sent the results to her via [employer]. Says she did not see his message at the bottom so I read it to her. Pt reports she is not any better so is asking for a referral to see Dr. Cyndy Freeze. She is aware Dr. Darnell Level is out of the office.

## 2017-04-07 NOTE — Telephone Encounter (Signed)
Copied from [place] #[id number]. Topic: Inquiry >> Apr 07, 2017 11:48 AM Oliver Pila B wrote: Reason for CRM: PT called to get the results of her latest X-ray

## 2017-04-07 NOTE — Telephone Encounter (Signed)
This encounter was created in error - please disregard.

## 2017-04-09 ENCOUNTER — Other Ambulatory Visit: Payer: Self-pay | Admitting: Family Medicine

## 2017-04-09 DIAGNOSIS — M542 Cervicalgia: Secondary | ICD-10-CM

## 2017-04-09 NOTE — Telephone Encounter (Signed)
Ordered the referral.  Thanks.

## 2017-04-10 NOTE — Telephone Encounter (Signed)
Noted  

## 2017-04-12 ENCOUNTER — Encounter: Payer: Self-pay | Admitting: Family Medicine

## 2017-05-02 DIAGNOSIS — M4722 Other spondylosis with radiculopathy, cervical region: Secondary | ICD-10-CM | POA: Diagnosis not present

## 2017-05-08 ENCOUNTER — Other Ambulatory Visit: Payer: Self-pay | Admitting: Family Medicine

## 2017-06-14 DIAGNOSIS — H04123 Dry eye syndrome of bilateral lacrimal glands: Secondary | ICD-10-CM | POA: Diagnosis not present

## 2017-06-19 ENCOUNTER — Other Ambulatory Visit: Payer: Self-pay | Admitting: Family Medicine

## 2017-06-19 NOTE — Telephone Encounter (Signed)
Last filled:  02/27/17, #30 Last OV:  03/20/17 Next OV:  none

## 2017-07-10 ENCOUNTER — Encounter: Payer: Self-pay | Admitting: Family Medicine

## 2017-07-10 ENCOUNTER — Ambulatory Visit: Payer: BLUE CROSS/BLUE SHIELD | Admitting: Family Medicine

## 2017-07-10 ENCOUNTER — Other Ambulatory Visit: Payer: Self-pay | Admitting: Family Medicine

## 2017-07-10 VITALS — BP 122/82 | HR 64 | Temp 98.1°F | Wt 148.0 lb

## 2017-07-10 DIAGNOSIS — R0981 Nasal congestion: Secondary | ICD-10-CM | POA: Diagnosis not present

## 2017-07-10 MED ORDER — FLUTICASONE PROPIONATE 50 MCG/ACT NA SUSP: 2.0000 | Freq: Every day | NASAL | 1 refills | Status: DC

## 2017-07-10 NOTE — Assessment & Plan Note (Signed)
Anticipate viral and allergic. Supportive care reviewed. Update if not improving with treatment. Red flags to seek further care reviewed. Pt agrees with plan.Marland Kitchen

## 2017-07-10 NOTE — Telephone Encounter (Signed)
Patient last seen in office: 03/20/17.   Last refill (with dosage increase):  03/20/17 take 1 pill up to 3x daily as needed #90, 3 refills  Next Appointment:  Today, 07/10/17 for acute sinus infection, will pend for approval by provider as may need f/u on cervical strain issues.

## 2017-07-10 NOTE — Patient Instructions (Signed)
I think symptoms are more viral and allergic. Start flonase.  Push fluids and plenty of rest. Start sinus rinse  Take plain mucinex with plenty of fluids to help mobilize mucous.  Please let us know if fever >101.5, trouble opening/closing mouth, difficulty swallowing, or worsening instead of improving as expected. Let us know if ongoing symptoms of sinus congestion headache pressure past >10 days

## 2017-07-10 NOTE — Progress Notes (Signed)
BP 122/82 (BP Location: Left Arm, Patient Position: Sitting, Cuff Size: Normal)   Pulse 64   Temp 98.1 F (36.7 C) (Oral)   Wt 148 lb (67.1 kg)   SpO2 97%   BMI 24.63 kg/m    CC: sinus congestion Subjective:    Patient ID: Elizabeth Long, female    DOB: July 06, 1960, 57 y.o.   MRN: [id number]  HPI: KARNE OZGA is a 57 y.o. female presenting on 07/10/2017 for Sinus Problem (Sinus congestion and drainage. Tried Sudafed, helpful)   1 wk h/o chest congestion and cough, this morning head all stopped up. Symptoms come and go. ST and PNdrainage.   No fevers/chills, ear or tooth pain, dyspnea or wheezing.   Treating with tussin DM with significant improvement. Has also tried chloraseptic spray. Has also tried pseudophed.  Ex smoker - 2001 No sick contacts at home.  No h/o asthma.   Stopped hydroxyzine - too sedating. Wants to taper off effexor - currently taking effexor 150mg  Q2 days.   Relevant past medical, surgical, family and social history reviewed and updated as indicated. Interim medical history since our last visit reviewed. Allergies and medications reviewed and updated. Outpatient Medications Prior to Visit  Medication Sig Dispense Refill  . cetirizine (ZYRTEC) 10 MG tablet Take 10 mg by mouth daily as needed for allergies.    . Cholecalciferol (VITAMIN D) 2000 units CAPS Take 2,000 Units by mouth daily.    . clidinium-chlordiazePOXIDE (LIBRAX) 5-2.5 MG capsule Take 1 capsule by mouth every morning. (Patient taking differently: Take 1 capsule by mouth every morning. As needed) 90 capsule 1  . estradiol (ESTRACE) 2 MG tablet Take 2 mg by mouth daily.    Marland Kitchen gabapentin (NEURONTIN) 100 MG capsule Take 1 tablet nightly for 3 days then increase to twice daily for 3 days then three times daily as needed 90 capsule 3  . hydrochlorothiazide (MICROZIDE) 12.5 MG capsule Take 1 capsule (12.5 mg total) by mouth daily. 90 capsule 1  . meloxicam (MOBIC) 7.5 MG tablet TAKE 1 TABLET (7.5 MG  TOTAL) BY MOUTH DAILY AS NEEDED FOR PAIN. 30 tablet 0  . Multiple Vitamins-Minerals (CENTRUM SILVER 50+WOMEN) TABS Take 1 tablet by mouth daily.    Marland Kitchen omeprazole (PRILOSEC) 20 MG capsule TAKE 1 CAPSULE DAILY 90 capsule 2  . oxyCODONE (OXY IR/ROXICODONE) 5 MG immediate release tablet Take 1-2 tablets (5-10 mg total) by mouth every 6 (six) hours as needed for moderate pain, severe pain or breakthrough pain. 30 tablet 0  . traMADol (ULTRAM) 50 MG tablet Take 50 mg by mouth at bedtime as needed for moderate pain.     Marland Kitchen venlafaxine XR (EFFEXOR-XR) 150 MG 24 hr capsule Take 1 capsule (150 mg total) by mouth daily. (Patient taking differently: Take 150 mg by mouth at bedtime. Takes 1 tablet every 2 days) 90 capsule 3  . LORazepam (ATIVAN) 2 MG tablet Take 0.5 tablets (1 mg total) by mouth at bedtime.  0  . omeprazole (PRILOSEC) 20 MG capsule Take 1 capsule (20 mg total) by mouth daily. One tablet by mouth once daily (Patient taking differently: Take 20 mg by mouth daily. ) 90 capsule 1  . hydrOXYzine (ATARAX/VISTARIL) 50 MG tablet Take 1 tablet (50 mg total) by mouth at bedtime as needed. (Patient taking differently: Take 50 mg by mouth at bedtime. ) 90 tablet 3  . phentermine 37.5 MG capsule Take 37.5 mg by mouth daily before breakfast.    . Potassium 99 MG  TABS Take 99 mg by mouth daily.     . predniSONE (DELTASONE) 20 MG tablet Take two tablets daily for 3 days followed by one tablet daily for 4 days 10 tablet 0  . pyridOXINE (VITAMIN B-6) 100 MG tablet Take 100 mg by mouth daily.     No facility-administered medications prior to visit.      Per HPI unless specifically indicated in ROS section below Review of Systems     Objective:    BP 122/82 (BP Location: Left Arm, Patient Position: Sitting, Cuff Size: Normal)   Pulse 64   Temp 98.1 F (36.7 C) (Oral)   Wt 148 lb (67.1 kg)   SpO2 97%   BMI 24.63 kg/m   Wt Readings from Last 3 Encounters:  07/10/17 148 lb (67.1 kg)  03/20/17 144 lb 8 oz  (65.5 kg)  03/01/17 143 lb 8 oz (65.1 kg)    Physical Exam  Constitutional: She appears well-developed and well-nourished. No distress.  HENT:  Head: Normocephalic and atraumatic.  Right Ear: Hearing, tympanic membrane, external ear and ear canal normal.  Left Ear: Hearing, tympanic membrane, external ear and ear canal normal.  Nose: Mucosal edema present. No rhinorrhea. Right sinus exhibits no maxillary sinus tenderness and no frontal sinus tenderness. Left sinus exhibits no maxillary sinus tenderness and no frontal sinus tenderness.  Mouth/Throat: Uvula is midline, oropharynx is clear and moist and mucous membranes are normal. No oropharyngeal exudate, posterior oropharyngeal edema, posterior oropharyngeal erythema or tonsillar abscesses.  Eyes: Conjunctivae and EOM are normal. Pupils are equal, round, and reactive to light. No scleral icterus.  Neck: Normal range of motion. Neck supple.  Cardiovascular: Normal rate, regular rhythm, normal heart sounds and intact distal pulses.  No murmur heard. Pulmonary/Chest: Effort normal and breath sounds normal. No respiratory distress. She has no wheezes. She has no rales.  Lymphadenopathy:    She has no cervical adenopathy.  Skin: Skin is warm and dry. No rash noted.  Nursing note and vitals reviewed.  Results for orders placed or performed in visit on 03/08/17  HM MAMMOGRAPHY  Result Value Ref Range   HM Mammogram 0-4 Bi-Rad 0-4 Bi-Rad, Self Reported Normal      Assessment & Plan:   Problem List Items Addressed This Visit    Sinus congestion - Primary    Anticipate viral and allergic. Supportive care reviewed. Update if not improving with treatment. Red flags to seek further care reviewed. Pt agrees with plan..           Follow up plan: Return if symptoms worsen or fail to improve.  Ria Bush, MD

## 2017-07-17 ENCOUNTER — Other Ambulatory Visit: Payer: Self-pay | Admitting: Family Medicine

## 2017-07-28 ENCOUNTER — Encounter: Payer: Self-pay | Admitting: Family Medicine

## 2017-07-28 ENCOUNTER — Ambulatory Visit: Payer: BLUE CROSS/BLUE SHIELD | Admitting: Family Medicine

## 2017-07-28 VITALS — Temp 97.8°F | Wt 144.0 lb

## 2017-07-28 DIAGNOSIS — F418 Other specified anxiety disorders: Secondary | ICD-10-CM

## 2017-07-28 DIAGNOSIS — I10 Essential (primary) hypertension: Secondary | ICD-10-CM | POA: Diagnosis not present

## 2017-07-28 DIAGNOSIS — R42 Dizziness and giddiness: Secondary | ICD-10-CM

## 2017-07-28 NOTE — Assessment & Plan Note (Addendum)
Benign EKG (bradycardia), nonfocal neurological and cardiac exam, orthostatics normal. rec start monitoring bp regularly x 2 wks and drop off or send in log to review (via mychart). ?stress relation to some of her symptoms (high stress job).

## 2017-07-28 NOTE — Progress Notes (Signed)
Temp 97.8 F (36.6 C) (Oral)   Wt 144 lb (65.3 kg)   SpO2 100%   BMI 23.96 kg/m    CC: dizziness Subjective:    Patient ID: Elizabeth Long, female    DOB: 07-05-1960, 57 y.o.   MRN: [id number]  HPI: Elizabeth Long is a 57 y.o. female presenting on 07/28/2017 for Dizziness (Off and on for a few wks. Worsenend this week. BP has been fluctuating. 07/25/17 BP was 159/79. Pt says she has been eating better and exercising. )   BP elevated recently - 159/80 Tuesday, 308 systolic yesterday. Endorses dizziness, some intermittent chest discomfort, headache. Dizziness described as head spinning, lightheaded, seeing some light spots in vision, presyncopal at times. Some nausea. No fevers/chills, LOC or syncope. Chest pain comes on at rest, described as tightness mid sternum, not exertional. Some orthostatic symptoms.   Only on hctz 12.5mg  daily for blood pressure control.  Stressful job environment.  She is undergoing slow taper off effexor XR - down to 1 tab/wk.  Not taking librax or oxycodone - removed from med list.  Relevant past medical, surgical, family and social history reviewed and updated as indicated. Interim medical history since our last visit reviewed. Allergies and medications reviewed and updated. Outpatient Medications Prior to Visit  Medication Sig Dispense Refill  . cetirizine (ZYRTEC) 10 MG tablet Take 10 mg by mouth daily as needed for allergies.    . Cholecalciferol (VITAMIN D) 2000 units CAPS Take 2,000 Units by mouth daily.    Marland Kitchen estradiol (ESTRACE) 2 MG tablet Take 2 mg by mouth daily.    . fluticasone (FLONASE) 50 MCG/ACT nasal spray Place 2 sprays into both nostrils daily. 16 g 1  . gabapentin (NEURONTIN) 100 MG capsule TAKE ONE CAPSULE 3 TIMES A DAY AS NEEDED 90 capsule 3  . hydrochlorothiazide (MICROZIDE) 12.5 MG capsule Take 1 capsule (12.5 mg total) by mouth daily. 90 capsule 1  . LORazepam (ATIVAN) 2 MG tablet Take 0.5 tablets (1 mg total) by mouth at bedtime.  0    . meloxicam (MOBIC) 7.5 MG tablet TAKE 1 TABLET (7.5 MG TOTAL) BY MOUTH DAILY AS NEEDED FOR PAIN. 30 tablet 0  . Multiple Vitamins-Minerals (CENTRUM SILVER 50+WOMEN) TABS Take 1 tablet by mouth daily.    Marland Kitchen omeprazole (PRILOSEC) 20 MG capsule Take 1 capsule (20 mg total) by mouth daily. One tablet by mouth once daily (Patient taking differently: Take 20 mg by mouth daily. ) 90 capsule 1  . traMADol (ULTRAM) 50 MG tablet Take 50 mg by mouth at bedtime as needed for moderate pain.     . clidinium-chlordiazePOXIDE (LIBRAX) 5-2.5 MG capsule Take 1 capsule by mouth every morning. (Patient taking differently: Take 1 capsule by mouth every morning. As needed) 90 capsule 1  . omeprazole (PRILOSEC) 20 MG capsule TAKE 1 CAPSULE DAILY 90 capsule 2  . oxyCODONE (OXY IR/ROXICODONE) 5 MG immediate release tablet Take 1-2 tablets (5-10 mg total) by mouth every 6 (six) hours as needed for moderate pain, severe pain or breakthrough pain. 30 tablet 0  . venlafaxine XR (EFFEXOR-XR) 150 MG 24 hr capsule Take 1 capsule (150 mg total) by mouth daily. (Patient taking differently: Take 150 mg by mouth at bedtime. Takes 1 tablet every 2 days) 90 capsule 3   No facility-administered medications prior to visit.      Per HPI unless specifically indicated in ROS section below Review of Systems     Objective:    Temp 97.8  F (36.6 C) (Oral)   Wt 144 lb (65.3 kg)   SpO2 100%   BMI 23.96 kg/m   Wt Readings from Last 3 Encounters:  07/28/17 144 lb (65.3 kg)  07/10/17 148 lb (67.1 kg)  03/20/17 144 lb 8 oz (65.5 kg)    Physical Exam  Constitutional: She appears well-developed and well-nourished. No distress.  HENT:  Head: Normocephalic and atraumatic.  Mouth/Throat: Oropharynx is clear and moist. No oropharyngeal exudate.  Cardiovascular: Normal rate, regular rhythm, normal heart sounds and intact distal pulses.  No murmur heard. Pulmonary/Chest: Effort normal and breath sounds normal. No respiratory distress.  She has no wheezes. She has no rales.  Musculoskeletal: She exhibits no edema.  Neurological: She is alert. She has normal strength. No cranial nerve deficit or sensory deficit. She displays a negative Romberg sign. Coordination and gait normal.  CN 2-12 intact Station and gait intact FTN intact EOMI No pronator drift  Skin: Skin is warm and dry. No rash noted.  Nursing note and vitals reviewed.  EKG - sinus bradycardia, normal axis, intervals, no acute ST/T changes    Assessment & Plan:   Problem List Items Addressed This Visit    Anxiety associated with depression    She has been able to taper off effexor.  Desires to continue lorazepam (prescribed by GYN)      Dizziness - Primary    Benign EKG (bradycardia), nonfocal neurological and cardiac exam, orthostatics normal. rec start monitoring bp regularly x 2 wks and drop off or send in log to review (via mychart). ?stress relation to some of her symptoms (high stress job).       Relevant Orders   EKG 12-Lead (Completed)   Essential hypertension    Chronic. Endorses some elevated readings at home - but bp well controlled in office today. Will have her keep log of bp and drop off in 2 wks to review, then decide on need for antihypertensive change. Pt agrees with plan.           No orders of the defined types were placed in this encounter.  Orders Placed This Encounter  Procedures  . EKG 12-Lead    Follow up plan: No Follow-up on file.  Ria Bush, MD

## 2017-07-28 NOTE — Patient Instructions (Addendum)
EKG today looking ok.  Orthostatic vital signs normal today - blood pressures were wonderful today.  Keep log of blood pressures over the next 2 weeks and either drop off here or send me via mychart to review. Then we will decide on BP med changes if needed. Good to see you today, call us with questions.

## 2017-07-28 NOTE — Assessment & Plan Note (Signed)
Chronic. Endorses some elevated readings at home - but bp well controlled in office today. Will have her keep log of bp and drop off in 2 wks to review, then decide on need for antihypertensive change. Pt agrees with plan.

## 2017-07-28 NOTE — Assessment & Plan Note (Signed)
She has been able to taper off effexor.  Desires to continue lorazepam (prescribed by GYN)

## 2017-08-11 ENCOUNTER — Encounter: Payer: Self-pay | Admitting: Family Medicine

## 2017-08-11 DIAGNOSIS — R5383 Other fatigue: Secondary | ICD-10-CM

## 2017-08-15 ENCOUNTER — Other Ambulatory Visit: Payer: Self-pay | Admitting: Family Medicine

## 2017-08-15 NOTE — Telephone Encounter (Signed)
Last filled:  07/17/17, #30 Last OV:  07/28/17 Next OV:  none

## 2017-09-15 ENCOUNTER — Other Ambulatory Visit: Payer: Self-pay | Admitting: Family Medicine

## 2017-09-15 NOTE — Telephone Encounter (Signed)
Last filled:  08/16/17, #30 Last OV:  07/28/17 Next OV:  none

## 2017-09-27 ENCOUNTER — Encounter: Payer: Self-pay | Admitting: Family Medicine

## 2017-10-02 ENCOUNTER — Encounter: Payer: Self-pay | Admitting: Family Medicine

## 2017-10-02 MED ORDER — MELOXICAM 7.5 MG PO TABS: 7.5000 mg | ORAL_TABLET | Freq: Every day | ORAL | 1 refills | Status: DC | PRN

## 2017-10-12 DIAGNOSIS — M79672 Pain in left foot: Secondary | ICD-10-CM | POA: Diagnosis not present

## 2017-10-12 DIAGNOSIS — M25531 Pain in right wrist: Secondary | ICD-10-CM | POA: Diagnosis not present

## 2017-10-30 ENCOUNTER — Other Ambulatory Visit: Payer: Self-pay | Admitting: Family Medicine

## 2017-10-31 NOTE — Telephone Encounter (Signed)
Electronic refill request Last refill 07/12/17 #90/3 Last office visit 07/28/17

## 2017-11-06 DIAGNOSIS — H16223 Keratoconjunctivitis sicca, not specified as Sjogren's, bilateral: Secondary | ICD-10-CM | POA: Diagnosis not present

## 2017-11-08 DIAGNOSIS — M79672 Pain in left foot: Secondary | ICD-10-CM | POA: Diagnosis not present

## 2017-11-11 ENCOUNTER — Other Ambulatory Visit: Payer: Self-pay | Admitting: Family Medicine

## 2017-12-03 ENCOUNTER — Other Ambulatory Visit: Payer: Self-pay | Admitting: Family Medicine

## 2017-12-11 ENCOUNTER — Encounter: Payer: Self-pay | Admitting: Family Medicine

## 2017-12-11 ENCOUNTER — Telehealth: Payer: Self-pay

## 2017-12-11 MED ORDER — OMEPRAZOLE 20 MG PO CPDR: 20.0000 mg | DELAYED_RELEASE_CAPSULE | Freq: Every day | ORAL | 0 refills | Status: DC

## 2017-12-11 NOTE — Telephone Encounter (Addendum)
Received email from pt (see Pt email, 12/11/17) requesting refills for omeprazole 20 mg and Librax 5-205 mg be sent to [facility].  Sent refill for omeprazole. However Librax is not on current med list.

## 2017-12-11 NOTE — Telephone Encounter (Signed)
Sent refill for omeprazole. [See TE, 12/11/17]  Librax not on current med list.

## 2017-12-11 NOTE — Telephone Encounter (Signed)
She stopped librax 07/2017. plz see what symptoms she is having. I don't recommend librax as she's already on another benzo (lorazepam) but may try different med depending on symptoms.

## 2017-12-12 MED ORDER — DICYCLOMINE HCL 10 MG PO CAPS: 10.0000 mg | ORAL_CAPSULE | Freq: Three times a day (TID) | ORAL | 3 refills | Status: DC

## 2017-12-12 NOTE — Telephone Encounter (Signed)
We can try [facility]cl[facility]ine - [facility] [place].

## 2017-12-12 NOTE — Telephone Encounter (Signed)
Spoke with Elizabeth Long relaying Dr. Synthia Innocent message.  Elizabeth Long says she will try it.

## 2017-12-12 NOTE — Telephone Encounter (Signed)
Spoke with pt relaying Dr. Synthia Innocent message.  Pt states she is having diarrhea, bloating and cramps daily.  She has been treating sxs with OTC meds.

## 2017-12-15 NOTE — Telephone Encounter (Signed)
See phone note

## 2018-02-06 IMAGING — DX DG CERVICAL SPINE COMPLETE 4+V
6 series · 6 of 6 positions shown · non-contrast
Comparison: MRI of the cervical spine March 18, 2010 and
lateral radiographic view of the cervical spine March 03, 2009

CLINICAL DATA: Neck pain with right-sided radicular symptoms.

EXAM:
CERVICAL SPINE - COMPLETE 4+ VIEW

[c-spine lat]
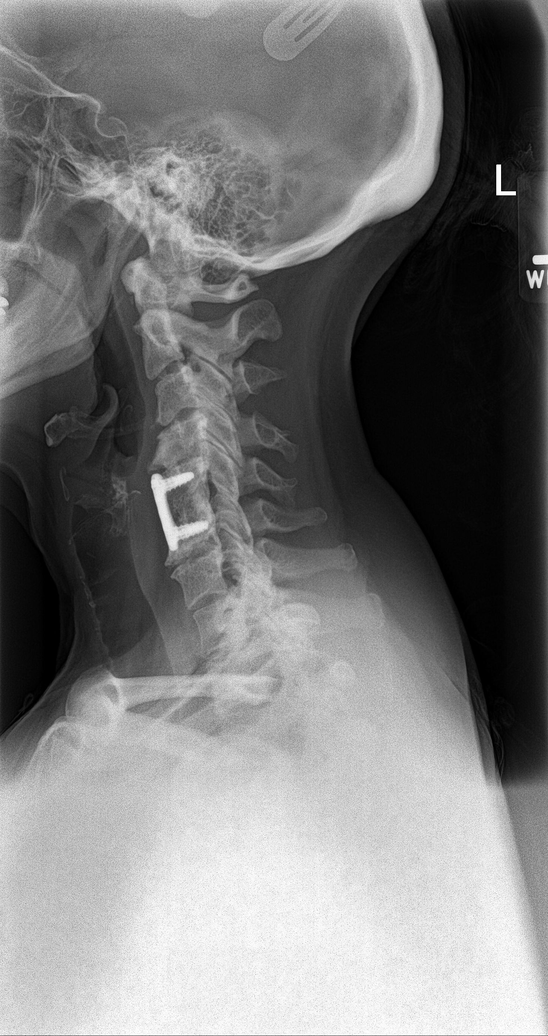

[c-spine obl (1 of 2)]
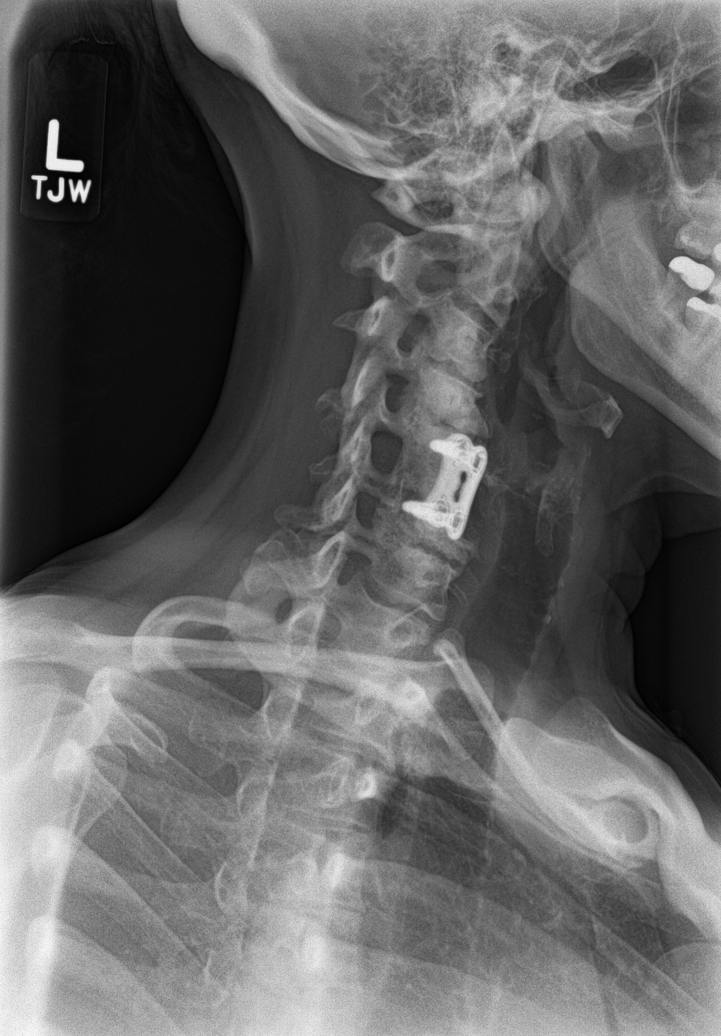

[c-spine obl (2 of 2)]
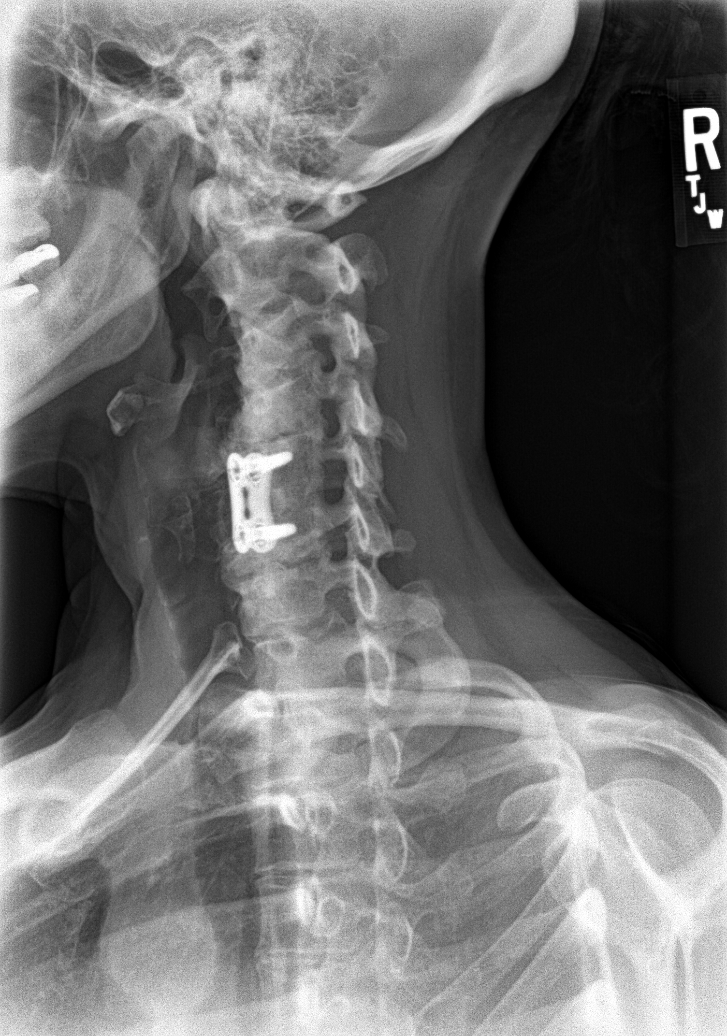

[c-spine ap]
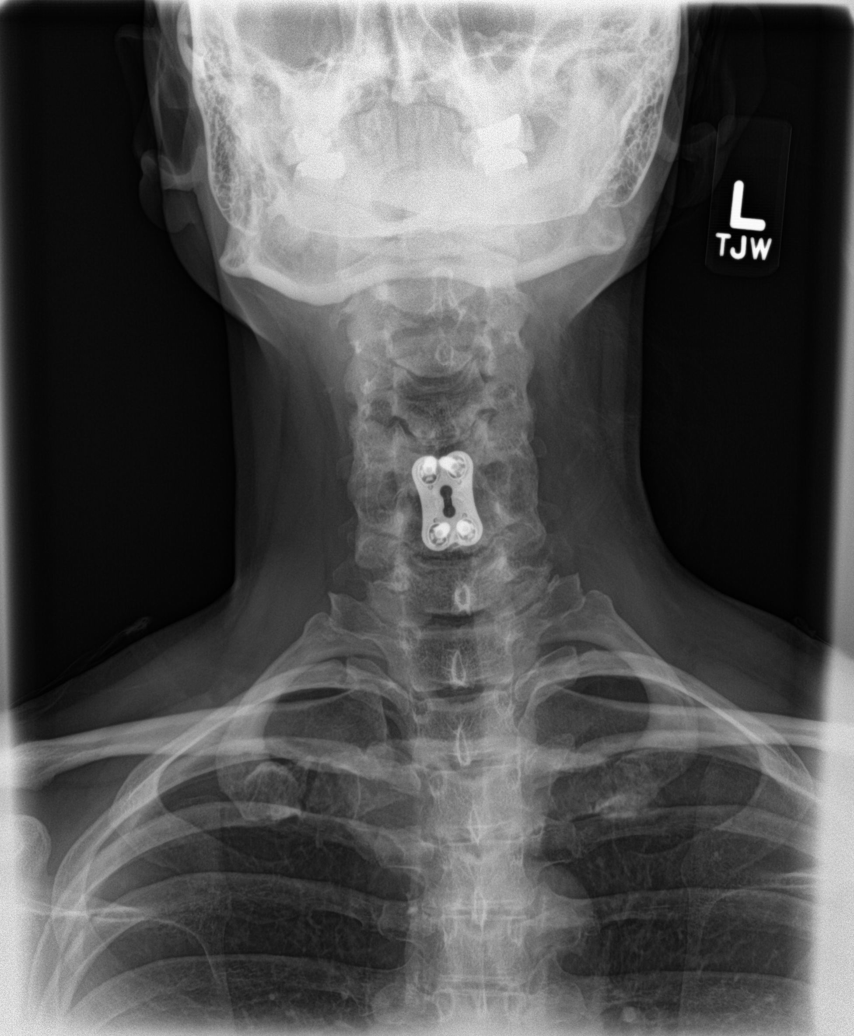

[c-spine open mouth]
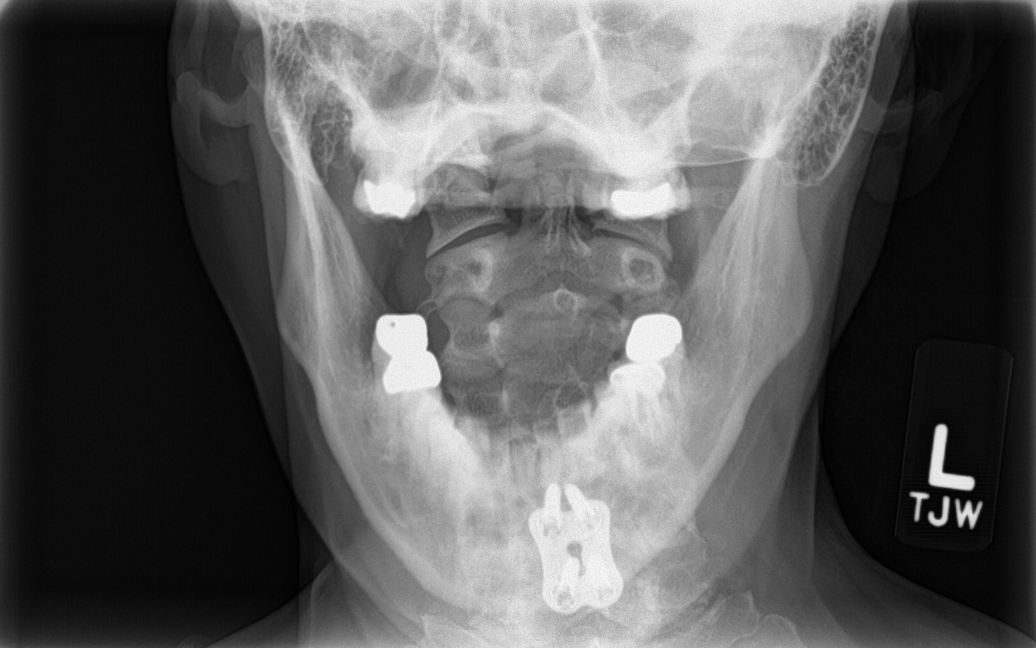

[c-spine swimmers]
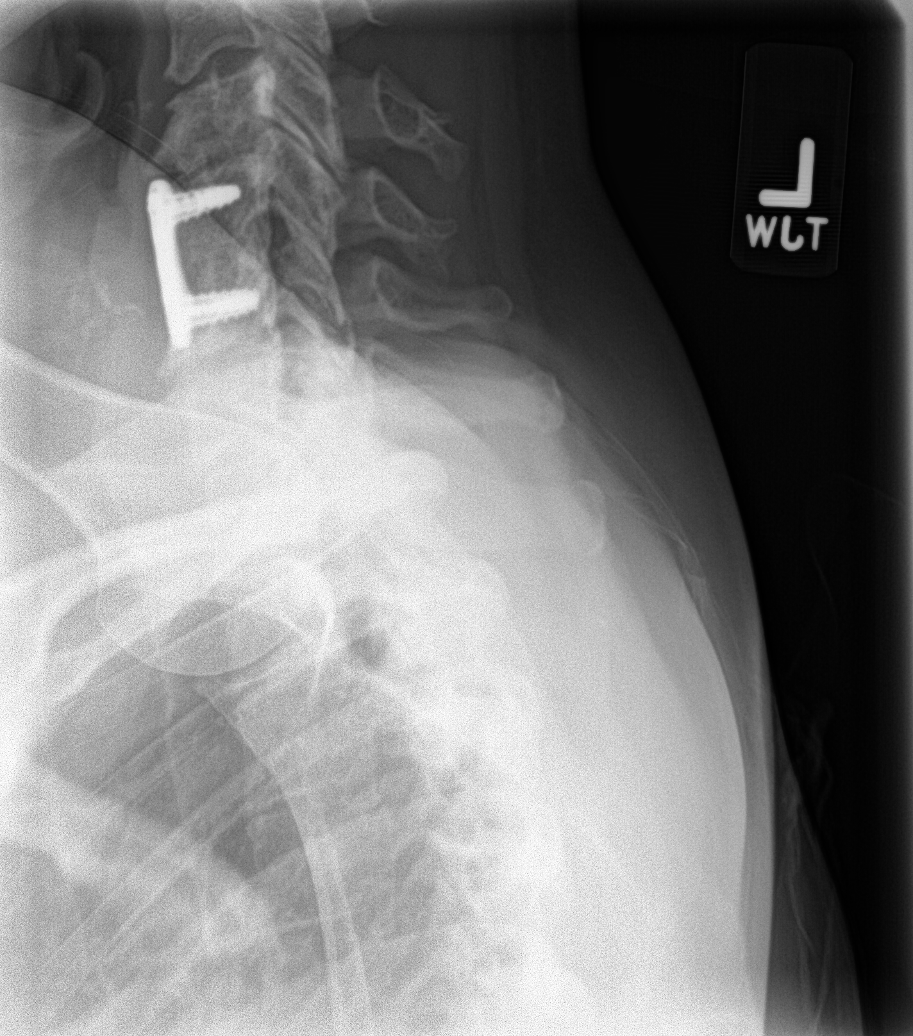

[6 of 6 positions shown; findings below may reference images not displayed]

FINDINGS: The patient has undergone previous ACDF at C5-6. The metallic
hardware is intact. Since the previous studies there has developed
disc space height loss and C3-4, C4-5, and C6-7. There is mild facet
joint hypertrophy in the lower cervical spine which has progressed.
The spinous processes are intact. The prevertebral soft tissue
spaces appear normal. The oblique views reveal mild bony
encroachment upon the neural foramina at C3-4 and C4-5 on the right
and at the same levels on the left. The odontoid is intact where
visualized.
IMPRESSION: Stable appearing ACDF at C5-6. Interval progression of degenerative
changes at C3-4, C4-5, and C6-7. Mild bony encroachment bilaterally
upon the neural foramina at C3 and C4.

## 2018-03-03 ENCOUNTER — Other Ambulatory Visit: Payer: Self-pay | Admitting: Family Medicine

## 2018-03-07 ENCOUNTER — Other Ambulatory Visit (INDEPENDENT_AMBULATORY_CARE_PROVIDER_SITE_OTHER): Payer: BLUE CROSS/BLUE SHIELD

## 2018-03-07 ENCOUNTER — Encounter: Payer: BLUE CROSS/BLUE SHIELD | Admitting: Family Medicine

## 2018-03-07 DIAGNOSIS — Z131 Encounter for screening for diabetes mellitus: Secondary | ICD-10-CM

## 2018-03-07 DIAGNOSIS — R5383 Other fatigue: Secondary | ICD-10-CM | POA: Diagnosis not present

## 2018-03-07 LAB — TSH: TSH: 1.63 u[IU]/mL (ref 0.35–4.50)

## 2018-03-07 LAB — HEMOGLOBIN A1C: HEMOGLOBIN A1C: 5.7 % (ref 4.6–6.5)

## 2018-03-07 LAB — CBC WITH DIFFERENTIAL/PLATELET
BASOS PCT: 1 % (ref 0.0–3.0)
Basophils Absolute: 0.1 10*3/uL (ref 0.0–0.1)
EOS ABS: 0.3 10*3/uL (ref 0.0–0.7)
EOS PCT: 3.6 % (ref 0.0–5.0)
HCT: 39.5 % (ref 36.0–46.0)
HEMOGLOBIN: 13.4 g/dL (ref 12.0–15.0)
Lymphocytes Relative: 40.4 % (ref 12.0–46.0)
Lymphs Abs: 2.9 10*3/uL (ref 0.7–4.0)
MCHC: 33.9 g/dL (ref 30.0–36.0)
MCV: 90.3 fl (ref 78.0–100.0)
MONO ABS: 0.5 10*3/uL (ref 0.1–1.0)
Monocytes Relative: 7.5 % (ref 3.0–12.0)
Neutro Abs: 3.4 10*3/uL (ref 1.4–7.7)
Neutrophils Relative %: 47.5 % (ref 43.0–77.0)
PLATELETS: 390 10*3/uL (ref 150.0–400.0)
RBC: 4.38 Mil/uL (ref 3.87–5.11)
RDW: 12.2 % (ref 11.5–15.5)
WBC: 7.1 10*3/uL (ref 4.0–10.5)

## 2018-03-07 LAB — COMPREHENSIVE METABOLIC PANEL
ALK PHOS: 62 U/L (ref 39–117)
ALT: 41 U/L — ABNORMAL HIGH (ref 0–35)
AST: 41 U/L — ABNORMAL HIGH (ref 0–37)
Albumin: 4.2 g/dL (ref 3.5–5.2)
BUN: 10 mg/dL (ref 6–23)
CHLORIDE: 99 meq/L (ref 96–112)
CO2: 31 meq/L (ref 19–32)
Calcium: 10.3 mg/dL (ref 8.4–10.5)
Creatinine, Ser: 0.87 mg/dL (ref 0.40–1.20)
GFR: 71.25 mL/min (ref >60.00)
GLUCOSE: 93 mg/dL (ref 70–99)
POTASSIUM: 4 meq/L (ref 3.5–5.1)
SODIUM: 137 meq/L (ref 135–145)
Total Bilirubin: 0.4 mg/dL (ref 0.2–1.2)
Total Protein: 7.5 g/dL (ref 6.0–8.3)

## 2018-03-07 LAB — VITAMIN B12: Vitamin B-12: 613 pg/mL (ref 211–911)

## 2018-03-07 LAB — VITAMIN D 25 HYDROXY (VIT D DEFICIENCY, FRACTURES): VITD: 38.44 ng/mL (ref 30.00–100.00)

## 2018-03-07 NOTE — Addendum Note (Signed)
Addended by: Lendon Collar on: 03/07/2018 10:24 AM   Modules accepted: Orders

## 2018-03-12 ENCOUNTER — Other Ambulatory Visit: Payer: Self-pay | Admitting: Family Medicine

## 2018-03-12 DIAGNOSIS — N63 Unspecified lump in unspecified breast: Secondary | ICD-10-CM | POA: Diagnosis not present

## 2018-03-12 DIAGNOSIS — Z01419 Encounter for gynecological examination (general) (routine) without abnormal findings: Secondary | ICD-10-CM | POA: Diagnosis not present

## 2018-03-12 DIAGNOSIS — N6009 Solitary cyst of unspecified breast: Secondary | ICD-10-CM | POA: Diagnosis not present

## 2018-03-12 DIAGNOSIS — Z6822 Body mass index (BMI) 22.0-22.9, adult: Secondary | ICD-10-CM | POA: Diagnosis not present

## 2018-03-13 ENCOUNTER — Encounter: Payer: Self-pay | Admitting: Family Medicine

## 2018-03-13 NOTE — Progress Notes (Signed)
BP 134/82 (BP Location: Left Arm, Patient Position: Sitting, Cuff Size: Normal)   Pulse 63   Temp 98 F (36.7 C) (Oral)   Ht 5' 4.75" (1.645 m)   Wt 136 lb 4 oz (61.8 kg)   SpO2 100%   BMI 22.85 kg/m    CC: CPE Subjective:    Patient ID: Elizabeth Long, female    DOB: [birth year], 57 y.o.   MRN: [id number]  HPI: Elizabeth Long is a 57 y.o. female presenting on 03/14/2018 for Annual Exam (Wants to discuss starting Wellbutrin or Zoloft.)   Father had CABG on Monday.  Ongoing diarrhea since 06/2017 - worse last 2 wks. Loose stools to watery diarrhea 4-5 times a day. No mucous or blood in stool. No abd pain or cramping. No fevers. No benefit with dicyclomine. Ran out of librax for IBS. Treating with imodium. No nocturnal episodes.  Last saw GI 09/2016 (Nandigam). Note reviewed. H/o collagenous colitis on colonoscopy biopsy in the past.   Worsening mood endorsed recently. Did well with zoloft and wellbutrin in the past. Requests recommencement of mood medication.   Preventative: Colonoscopy 09/2016 - 45mm TA, rpt 3 yrs (Nandigam)  Well woman with OBGYN Dr Stann Mainland 03/2018, s/p hysterectomy (endometriosis). Ovaries remain. Had pap.  Breast cyst aspirated on Monday by Dr Stann Mainland - pending results. Planned mammo next month. Mammogram last 02/2017 - yearly, dense breasts s/p 3D mammogram.  Flu shot at work  Tetanus - unsure >10 yrs ago. Tdap 02/2016 Seat belt use discussed Sunscreen use discussed. No changing moles on skin. poorly healing spot R forehead - present for 1.5 months. Has been treating with benzoyl peroxide.  Ex smoker - quit 2001 Alcohol use - none - sober since 1994.  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  Interim medical history since our last visit reviewed. Allergies and medications reviewed and updated. Outpatient Medications Prior to Visit  Medication Sig Dispense Refill  . cetirizine (ZYRTEC) 10 MG tablet Take 10 mg by mouth daily as needed for allergies.    Marland Kitchen dicyclomine (BENTYL) 10 MG capsule Take 1-2 capsules (10-20 mg total) by mouth 3 (three) times daily before meals. 30 capsule 3  . fluticasone (FLONASE) 50 MCG/ACT nasal spray Place 2 sprays into both nostrils daily. 16 g 1  . gabapentin (NEURONTIN) 100 MG capsule TAKE ONE CAPSULE BY MOUTH 3 TIMES A DAY AS NEEDED 90 capsule 2  . hydrochlorothiazide (MICROZIDE) 12.5 MG capsule TAKE 1 CAPSULE DAILY 90 capsule 0  . LORazepam (ATIVAN) 2 MG tablet Take 0.5 tablets (1 mg total) by mouth at bedtime.  0  . meloxicam (MOBIC) 7.5 MG tablet Take 1 tablet (7.5 mg total) by mouth daily as needed for pain. 90 tablet 1  . hydrochlorothiazide (MICROZIDE) 12.5 MG capsule TAKE 1 CAPSULE DAILY 90 capsule 1  . omeprazole (PRILOSEC) 20 MG capsule TAKE 1 CAPSULE DAILY 90 capsule 0  . Multiple Vitamins-Minerals (CENTRUM SILVER 50+WOMEN) TABS Take 1 tablet by mouth daily.    . traMADol (ULTRAM) 50 MG tablet Take 50 mg by mouth at bedtime as needed for moderate pain.     . Cholecalciferol (VITAMIN D) 2000 units CAPS Take 2,000 Units by mouth daily.    Marland Kitchen estradiol (ESTRACE)  2 MG tablet Take 2 mg by mouth daily.     No facility-administered medications prior to visit.      Per HPI unless specifically indicated in ROS section below Review of Systems  Constitutional: Negative for activity change, appetite change, chills, fatigue, fever and unexpected weight change.  HENT: Negative for hearing loss.   Eyes: Negative for visual disturbance.  Respiratory: Positive for chest tightness (?panic attack). Negative for cough, shortness of breath and wheezing.   Cardiovascular: Negative for chest pain, palpitations and leg swelling.  Gastrointestinal: Positive for  diarrhea (last 2 wks). Negative for abdominal distention, abdominal pain, blood in stool, constipation, nausea and vomiting.       Worsening GERD  Genitourinary: Negative for difficulty urinating and hematuria.  Musculoskeletal: Negative for arthralgias, myalgias and neck pain.  Skin: Negative for rash.  Neurological: Positive for dizziness. Negative for seizures, syncope and headaches.  Hematological: Negative for adenopathy. Does not bruise/bleed easily.  Psychiatric/Behavioral: Positive for dysphoric mood. The patient is nervous/anxious.        Objective:    BP 134/82 (BP Location: Left Arm, Patient Position: Sitting, Cuff Size: Normal)   Pulse 63   Temp 98 F (36.7 C) (Oral)   Ht 5' 4.75" (1.645 m)   Wt 136 lb 4 oz (61.8 kg)   SpO2 100%   BMI 22.85 kg/m   Wt Readings from Last 3 Encounters:  03/14/18 136 lb 4 oz (61.8 kg)  07/28/17 144 lb (65.3 kg)  07/10/17 148 lb (67.1 kg)    Physical Exam  Constitutional: She is oriented to person, place, and time. She appears well-developed and well-nourished. No distress.  HENT:  Head: Normocephalic and atraumatic.  Right Ear: Hearing, tympanic membrane, external ear and ear canal normal.  Left Ear: Hearing, tympanic membrane, external ear and ear canal normal.  Nose: Nose normal.  Mouth/Throat: Uvula is midline, oropharynx is clear and moist and mucous membranes are normal. No oropharyngeal exudate, posterior oropharyngeal edema or posterior oropharyngeal erythema.  Eyes: Pupils are equal, round, and reactive to light. Conjunctivae and EOM are normal. No scleral icterus.  Neck: Normal range of motion. Neck supple. No thyromegaly present.  Cardiovascular: Normal rate, regular rhythm, normal heart sounds and intact distal pulses.  No murmur heard. Pulses:      Radial pulses are 2+ on the right side, and 2+ on the left side.  Pulmonary/Chest: Effort normal and breath sounds normal. No respiratory distress. She has no wheezes. She has  no rales.  Abdominal: Soft. Bowel sounds are normal. She exhibits no distension and no mass. There is no tenderness. There is no rebound and no guarding.  Musculoskeletal: Normal range of motion. She exhibits no edema.  Lymphadenopathy:    She has no cervical adenopathy.  Neurological: She is alert and oriented to person, place, and time.  CN grossly intact, station and gait intact  Skin: Skin is warm and dry. No rash noted.  Psychiatric: She has a normal mood and affect. Her behavior is normal. Judgment and thought content normal.  Nursing note and vitals reviewed.  Results for orders placed or performed in visit on 03/07/18  CBC with Differential/Platelet  Result Value Ref Range   WBC 7.1 4.0 - 10.5 K/uL   RBC 4.38 3.87 - 5.11 Mil/uL   Hemoglobin 13.4 12.0 - 15.0 g/dL   HCT 39.5 36.0 - 46.0 %   MCV 90.3 78.0 - 100.0 fl   MCHC 33.9 30.0 - 36.0 g/dL   RDW 12.2  11.5 - 15.5 %   Platelets 390.0 150.0 - 400.0 K/uL   Neutrophils Relative % 47.5 43.0 - 77.0 %   Lymphocytes Relative 40.4 12.0 - 46.0 %   Monocytes Relative 7.5 3.0 - 12.0 %   Eosinophils Relative 3.6 0.0 - 5.0 %   Basophils Relative 1.0 0.0 - 3.0 %   Neutro Abs 3.4 1.4 - 7.7 K/uL   Lymphs Abs 2.9 0.7 - 4.0 K/uL   Monocytes Absolute 0.5 0.1 - 1.0 K/uL   Eosinophils Absolute 0.3 0.0 - 0.7 K/uL   Basophils Absolute 0.1 0.0 - 0.1 K/uL  TSH  Result Value Ref Range   TSH 1.63 0.35 - 4.50 uIU/mL  Comprehensive metabolic panel  Result Value Ref Range   Sodium 137 135 - 145 mEq/L   Potassium 4.0 3.5 - 5.1 mEq/L   Chloride 99 96 - 112 mEq/L   CO2 31 19 - 32 mEq/L   Glucose, Bld 93 70 - 99 mg/dL   BUN 10 6 - 23 mg/dL   Creatinine, Ser 0.87 0.40 - 1.20 mg/dL   Total Bilirubin 0.4 0.2 - 1.2 mg/dL   Alkaline Phosphatase 62 39 - 117 U/L   AST 41 (H) 0 - 37 U/L   ALT 41 (H) 0 - 35 U/L   Total Protein 7.5 6.0 - 8.3 g/dL   Albumin 4.2 3.5 - 5.2 g/dL   Calcium 10.3 8.4 - 10.5 mg/dL   GFR 71.25 >60.00 mL/min  VITAMIN D 25  Hydroxy (Vit-D Deficiency, Fractures)  Result Value Ref Range   VITD 38.44 30.00 - 100.00 ng/mL  Vitamin B12  Result Value Ref Range   Vitamin B-12 613 211 - 911 pg/mL  Hemoglobin A1c  Result Value Ref Range   Hgb A1c MFr Bld 5.7 4.6 - 6.5 %   Depression screen [facility] 2/9 03/14/2018 03/01/2017 08/01/2016 06/09/2016  Decreased Interest 0 0 0 2  Down, Depressed, Hopeless 1 1 1 3   PHQ - 2 Score 1 1 1 5   Altered sleeping 3 - 1 3  Tired, decreased energy 0 - 1 3  Change in appetite 0 - 0 3  Feeling bad or failure about yourself  1 - 1 3  Trouble concentrating 1 - 1 2  Moving slowly or fidgety/restless 0 - 0 1  Suicidal thoughts 0 - 0 0  PHQ-9 Score 6 - 5 20  Difficult doing work/chores - - Not difficult at all Very difficult    GAD 7 : Generalized Anxiety Score 03/14/2018 08/01/2016 06/09/2016  Nervous, Anxious, on Edge 2 0 2  Control/stop worrying 3 1 3   Worry too much - different things 3 1 3   Trouble relaxing 3 1 2   Restless 1 0 0  Easily annoyed or irritable 2 0 2  Afraid - awful might happen 0 0 0  Total GAD 7 Score 14 3 12   Anxiety Difficulty - - Very difficult        Assessment & Plan:   Problem List Items Addressed This Visit    Osteoarthritis    rec hold meloxicam until colitis flare has resolved.       Relevant Medications   predniSONE (DELTASONE) 20 MG tablet   predniSONE (DELTASONE) 5 MG tablet   Microscopic colitis    Endorses ongoing trouble for the last 9 months, with worsening diarrheal frequency the past 2 weeks. No fevers, blood in stool or abd pain. Anticipate recurrent collagenous colitis - recommended GI f/u. However she states she cannot afford  return to GI at this time, cannot afford budesonide.  Will Rx prednisone taper 40/20/15/10/7.5/5/2.5 over next 1.5 months. Rx provided for next 4 weeks. Pt agrees with plan. RTC 3 wks close f/u visit.       Irritable bowel syndrome (IBS)   Relevant Medications   omeprazole (PRILOSEC) 20 MG capsule   Health  maintenance examination - Primary    Preventative protocols reviewed and updated unless pt declined. Discussed healthy diet and lifestyle.       GERD (gastroesophageal reflux disease)    Omeprazole 20mg  refilled.       Relevant Medications   omeprazole (PRILOSEC) 20 MG capsule   Essential hypertension    Chronic, stable. Continue current regimen.       Anxiety associated with depression    Previously able to taper off effexor, but worsening symptoms off medication. Increased family stressors. Pt desires to restart medication. Will avoid sertraline given current presumed active colitis. Will start wellbutrin SR 100mg  to take BID, first week take once daily. Reviewed possible increased suicidality. RTC 3 wks close f/u visit.      Relevant Medications   buPROPion (WELLBUTRIN SR) 100 MG 12 hr tablet    Other Visit Diagnoses    Need for influenza vaccination       Relevant Orders   Flu Vaccine QUAD 36+ mos IM (Completed)       Meds ordered this encounter  Medications  . omeprazole (PRILOSEC) 20 MG capsule    Sig: Take 1 capsule (20 mg total) by mouth daily.    Dispense:  90 capsule    Refill:  3  . buPROPion (WELLBUTRIN SR) 100 MG 12 hr tablet    Sig: Take 1 tablet (100 mg total) by mouth 2 (two) times daily.    Dispense:  60 tablet    Refill:  6  . predniSONE (DELTASONE) 20 MG tablet    Sig: Take two tablets daily for 7 days followed by one tablet daily for 7 days - then fill 5mg  prednisone course    Dispense:  21 tablet    Refill:  0  . predniSONE (DELTASONE) 5 MG tablet    Sig: Start after completing 20mg  dose. Take 3 tablets daily 15mg  for 1 week then 2 tablets daily 10mg  for 1 week. Office visit prior to next refill    Dispense:  35 tablet    Refill:  0    Start after completing 20mg  tablets   Orders Placed This Encounter  Procedures  . Flu Vaccine QUAD 36+ mos IM    Follow up plan: Return in about 3 weeks (around 04/04/2018) for follow up visit.  Ria Bush, MD

## 2018-03-13 NOTE — Assessment & Plan Note (Signed)
Preventative protocols reviewed and updated unless pt declined. Discussed healthy diet and lifestyle.  

## 2018-03-14 ENCOUNTER — Ambulatory Visit (INDEPENDENT_AMBULATORY_CARE_PROVIDER_SITE_OTHER): Payer: BLUE CROSS/BLUE SHIELD | Admitting: Family Medicine

## 2018-03-14 ENCOUNTER — Encounter: Payer: Self-pay | Admitting: Family Medicine

## 2018-03-14 VITALS — BP 134/82 | HR 63 | Temp 98.0°F | Ht 64.75 in | Wt 136.2 lb

## 2018-03-14 DIAGNOSIS — F418 Other specified anxiety disorders: Secondary | ICD-10-CM

## 2018-03-14 DIAGNOSIS — Z0000 Encounter for general adult medical examination without abnormal findings: Secondary | ICD-10-CM

## 2018-03-14 DIAGNOSIS — M150 Primary generalized (osteo)arthritis: Secondary | ICD-10-CM | POA: Diagnosis not present

## 2018-03-14 DIAGNOSIS — K219 Gastro-esophageal reflux disease without esophagitis: Secondary | ICD-10-CM

## 2018-03-14 DIAGNOSIS — K589 Irritable bowel syndrome without diarrhea: Secondary | ICD-10-CM

## 2018-03-14 DIAGNOSIS — K52831 Collagenous colitis: Secondary | ICD-10-CM | POA: Diagnosis not present

## 2018-03-14 DIAGNOSIS — M159 Polyosteoarthritis, unspecified: Secondary | ICD-10-CM

## 2018-03-14 DIAGNOSIS — Z23 Encounter for immunization: Secondary | ICD-10-CM | POA: Diagnosis not present

## 2018-03-14 DIAGNOSIS — I10 Essential (primary) hypertension: Secondary | ICD-10-CM

## 2018-03-14 DIAGNOSIS — M8949 Other hypertrophic osteoarthropathy, multiple sites: Secondary | ICD-10-CM

## 2018-03-14 MED ORDER — BUPROPION HCL ER (SR) 100 MG PO TB12: 100.0000 mg | ORAL_TABLET | Freq: Two times a day (BID) | ORAL | 6 refills | Status: DC

## 2018-03-14 MED ORDER — OMEPRAZOLE 20 MG PO CPDR: 20.0000 mg | DELAYED_RELEASE_CAPSULE | Freq: Every day | ORAL | 3 refills | Status: DC

## 2018-03-14 MED ORDER — PREDNISONE 5 MG PO TABS: ORAL_TABLET | ORAL | 0 refills | Status: DC

## 2018-03-14 MED ORDER — PREDNISONE 20 MG PO TABS: ORAL_TABLET | ORAL | 0 refills | Status: DC

## 2018-03-14 NOTE — Assessment & Plan Note (Signed)
Endorses ongoing trouble for the last 9 months, with worsening diarrheal frequency the past 2 weeks. No fevers, blood in stool or abd pain. Anticipate recurrent collagenous colitis - recommended GI f/u. However she states she cannot afford return to GI at this time, cannot afford budesonide.  Will Rx prednisone taper 40/20/15/10/7.5/5/2.5 over next 1.5 months. Rx provided for next 4 weeks. Pt agrees with plan. RTC 3 wks close f/u visit.

## 2018-03-14 NOTE — Assessment & Plan Note (Signed)
rec hold meloxicam until colitis flare has resolved.

## 2018-03-14 NOTE — Assessment & Plan Note (Signed)
Previously able to taper off effexor, but worsening symptoms off medication. Increased family stressors. Pt desires to restart medication. Will avoid sertraline given current presumed active colitis. Will start wellbutrin SR 100mg  to take BID, first week take once daily. Reviewed possible increased suicidality. RTC 3 wks close f/u visit.

## 2018-03-14 NOTE — Assessment & Plan Note (Signed)
Omeprazole 20 mg refilled.

## 2018-03-14 NOTE — Assessment & Plan Note (Signed)
Chronic, stable. Continue current regimen. 

## 2018-03-14 NOTE — Patient Instructions (Addendum)
Flu shot today Start wellbutrin SR '100mg'$  twice daily for mood Trial prednisone extended taper for colitis - '40mg'$  daily for 1 week then decrease to '20mg'$  daily for 1 week then '15mg'$  1 wk then '10mg'$  1 wk then 7.'5mg'$ , then '5mg'$ , then 2.'5mg'$ . Let me know if not improving with this. Good to see you today. Return in 3 weeks for follow up visit.   Health Maintenance, Female Adopting a healthy lifestyle and getting preventive care can go a long way to promote health and wellness. Talk with your health care provider about what schedule of regular examinations is right for you. This is a good chance for you to check in with your provider about disease prevention and staying healthy. In between checkups, there are plenty of things you can do on your own. Experts have done a lot of research about which lifestyle changes and preventive measures are most likely to keep you healthy. Ask your health care provider for more information. Weight and diet Eat a healthy diet  Be sure to include plenty of vegetables, fruits, low-fat dairy products, and lean protein.  Do not eat a lot of foods high in solid fats, added sugars, or salt.  Get regular exercise. This is one of the most important things you can do for your health. ? Most adults should exercise for at least 150 minutes each week. The exercise should increase your heart rate and make you sweat (moderate-intensity exercise). ? Most adults should also do strengthening exercises at least twice a week. This is in addition to the moderate-intensity exercise.  Maintain a healthy weight  Body mass index (BMI) is a measurement that can be used to identify possible weight problems. It estimates body fat based on height and weight. Your health care provider can help determine your BMI and help you achieve or maintain a healthy weight.  For females 71 years of age and older: ? A BMI below 18.5 is considered underweight. ? A BMI of 18.5 to 24.9 is normal. ? A BMI of 25 to  29.9 is considered overweight. ? A BMI of 30 and above is considered obese.  Watch levels of cholesterol and blood lipids  You should start having your blood tested for lipids and cholesterol at 57 years of age, then have this test every 5 years.  You may need to have your cholesterol levels checked more often if: ? Your lipid or cholesterol levels are high. ? You are older than 57 years of age. ? You are at high risk for heart disease.  Cancer screening Lung Cancer  Lung cancer screening is recommended for adults 33-81 years old who are at high risk for lung cancer because of a history of smoking.  A yearly low-dose CT scan of the lungs is recommended for people who: ? Currently smoke. ? Have quit within the past 15 years. ? Have at least a 30-pack-year history of smoking. A pack year is smoking an average of one pack of cigarettes a day for 1 year.  Yearly screening should continue until it has been 15 years since you quit.  Yearly screening should stop if you develop a health problem that would prevent you from having lung cancer treatment.  Breast Cancer  Practice breast self-awareness. This means understanding how your breasts normally appear and feel.  It also means doing regular breast self-exams. Let your health care provider know about any changes, no matter how small.  If you are in your 20s or 30s, you should  have a clinical breast exam (CBE) by a health care provider every 1-3 years as part of a regular health exam.  If you are 80 or older, have a CBE every year. Also consider having a breast X-ray (mammogram) every year.  If you have a family history of breast cancer, talk to your health care provider about genetic screening.  If you are at high risk for breast cancer, talk to your health care provider about having an MRI and a mammogram every year.  Breast cancer gene (BRCA) assessment is recommended for women who have family members with BRCA-related cancers.  BRCA-related cancers include: ? Breast. ? Ovarian. ? Tubal. ? Peritoneal cancers.  Results of the assessment will determine the need for genetic counseling and BRCA1 and BRCA2 testing.  Cervical Cancer Your health care provider may recommend that you be screened regularly for cancer of the pelvic organs (ovaries, uterus, and vagina). This screening involves a pelvic examination, including checking for microscopic changes to the surface of your cervix (Pap test). You may be encouraged to have this screening done every 3 years, beginning at age 59.  For women ages 47-65, health care providers may recommend pelvic exams and Pap testing every 3 years, or they may recommend the Pap and pelvic exam, combined with testing for human papilloma virus (HPV), every 5 years. Some types of HPV increase your risk of cervical cancer. Testing for HPV may also be done on women of any age with unclear Pap test results.  Other health care providers may not recommend any screening for nonpregnant women who are considered low risk for pelvic cancer and who do not have symptoms. Ask your health care provider if a screening pelvic exam is right for you.  If you have had past treatment for cervical cancer or a condition that could lead to cancer, you need Pap tests and screening for cancer for at least 20 years after your treatment. If Pap tests have been discontinued, your risk factors (such as having a new sexual partner) need to be reassessed to determine if screening should resume. Some women have medical problems that increase the chance of getting cervical cancer. In these cases, your health care provider may recommend more frequent screening and Pap tests.  Colorectal Cancer  This type of cancer can be detected and often prevented.  Routine colorectal cancer screening usually begins at 57 years of age and continues through 57 years of age.  Your health care provider may recommend screening at an earlier age if  you have risk factors for colon cancer.  Your health care provider may also recommend using home test kits to check for hidden blood in the stool.  A small camera at the end of a tube can be used to examine your colon directly (sigmoidoscopy or colonoscopy). This is done to check for the earliest forms of colorectal cancer.  Routine screening usually begins at age 99.  Direct examination of the colon should be repeated every 5-10 years through 57 years of age. However, you may need to be screened more often if early forms of precancerous polyps or small growths are found.  Skin Cancer  Check your skin from head to toe regularly.  Tell your health care provider about any new moles or changes in moles, especially if there is a change in a mole's shape or color.  Also tell your health care provider if you have a mole that is larger than the size of a pencil eraser.  Always  use sunscreen. Apply sunscreen liberally and repeatedly throughout the day.  Protect yourself by wearing long sleeves, pants, a wide-brimmed hat, and sunglasses whenever you are outside.  Heart disease, diabetes, and high blood pressure  High blood pressure causes heart disease and increases the risk of stroke. High blood pressure is more likely to develop in: ? People who have blood pressure in the high end of the normal range (130-139/85-89 mm Hg). ? People who are overweight or obese. ? People who are African American.  If you are 18-27 years of age, have your blood pressure checked every 3-5 years. If you are 32 years of age or older, have your blood pressure checked every year. You should have your blood pressure measured twice-once when you are at a hospital or clinic, and once when you are not at a hospital or clinic. Record the average of the two measurements. To check your blood pressure when you are not at a hospital or clinic, you can use: ? An automated blood pressure machine at a pharmacy. ? A home blood  pressure monitor.  If you are between 72 years and 79 years old, ask your health care provider if you should take aspirin to prevent strokes.  Have regular diabetes screenings. This involves taking a blood sample to check your fasting blood sugar level. ? If you are at a normal weight and have a low risk for diabetes, have this test once every three years after 58 years of age. ? If you are overweight and have a high risk for diabetes, consider being tested at a younger age or more often. Preventing infection Hepatitis B  If you have a higher risk for hepatitis B, you should be screened for this virus. You are considered at high risk for hepatitis B if: ? You were born in a country where hepatitis B is common. Ask your health care provider which countries are considered high risk. ? Your parents were born in a high-risk country, and you have not been immunized against hepatitis B (hepatitis B vaccine). ? You have HIV or AIDS. ? You use needles to inject street drugs. ? You live with someone who has hepatitis B. ? You have had sex with someone who has hepatitis B. ? You get hemodialysis treatment. ? You take certain medicines for conditions, including cancer, organ transplantation, and autoimmune conditions.  Hepatitis C  Blood testing is recommended for: ? Everyone born from [birth year] through 1965. ? Anyone with known risk factors for hepatitis C.  Sexually transmitted infections (STIs)  You should be screened for sexually transmitted infections (STIs) including gonorrhea and chlamydia if: ? You are sexually active and are younger than 57 years of age. ? You are older than 57 years of age and your health care provider tells you that you are at risk for this type of infection. ? Your sexual activity has changed since you were last screened and you are at an increased risk for chlamydia or gonorrhea. Ask your health care provider if you are at risk.  If you do not have HIV, but are at risk,  it may be recommended that you take a prescription medicine daily to prevent HIV infection. This is called pre-exposure prophylaxis (PrEP). You are considered at risk if: ? You are sexually active and do not regularly use condoms or know the HIV status of your partner(s). ? You take drugs by injection. ? You are sexually active with a partner who has HIV.  Talk with your  health care provider about whether you are at high risk of being infected with HIV. If you choose to begin PrEP, you should first be tested for HIV. You should then be tested every 3 months for as long as you are taking PrEP. Pregnancy  If you are premenopausal and you may become pregnant, ask your health care provider about preconception counseling.  If you may become pregnant, take 400 to 800 micrograms (mcg) of folic acid every day.  If you want to prevent pregnancy, talk to your health care provider about birth control (contraception). Osteoporosis and menopause  Osteoporosis is a disease in which the bones lose minerals and strength with aging. This can result in serious bone fractures. Your risk for osteoporosis can be identified using a bone density scan.  If you are 52 years of age or older, or if you are at risk for osteoporosis and fractures, ask your health care provider if you should be screened.  Ask your health care provider whether you should take a calcium or vitamin D supplement to lower your risk for osteoporosis.  Menopause may have certain physical symptoms and risks.  Hormone replacement therapy may reduce some of these symptoms and risks. Talk to your health care provider about whether hormone replacement therapy is right for you. Follow these instructions at home:  Schedule regular health, dental, and eye exams.  Stay current with your immunizations.  Do not use any tobacco products including cigarettes, chewing tobacco, or electronic cigarettes.  If you are pregnant, do not drink  alcohol.  If you are breastfeeding, limit how much and how often you drink alcohol.  Limit alcohol intake to no more than 1 drink per day for nonpregnant women. One drink equals 12 ounces of beer, 5 ounces of wine, or 1 ounces of hard liquor.  Do not use street drugs.  Do not share needles.  Ask your health care provider for help if you need support or information about quitting drugs.  Tell your health care provider if you often feel depressed.  Tell your health care provider if you have ever been abused or do not feel safe at home. This information is not intended to replace advice given to you by your health care provider. Make sure you discuss any questions you have with your health care provider. Document Released: 12/06/2010 Document Revised: 10/29/2015 Document Reviewed: 02/24/2015 Elsevier Interactive Patient Education  [employer].

## 2018-03-19 DIAGNOSIS — S00252A Superficial foreign body of left eyelid and periocular area, initial encounter: Secondary | ICD-10-CM | POA: Diagnosis not present

## 2018-04-04 ENCOUNTER — Ambulatory Visit: Payer: BLUE CROSS/BLUE SHIELD | Admitting: Family Medicine

## 2018-04-05 ENCOUNTER — Other Ambulatory Visit: Payer: Self-pay | Admitting: Family Medicine

## 2018-04-11 ENCOUNTER — Ambulatory Visit: Payer: BLUE CROSS/BLUE SHIELD | Admitting: Family Medicine

## 2018-04-11 ENCOUNTER — Encounter: Payer: Self-pay | Admitting: Family Medicine

## 2018-04-11 VITALS — BP 118/66 | HR 67 | Temp 97.9°F | Ht 64.75 in | Wt 135.5 lb

## 2018-04-11 DIAGNOSIS — M150 Primary generalized (osteo)arthritis: Secondary | ICD-10-CM | POA: Diagnosis not present

## 2018-04-11 DIAGNOSIS — K52831 Collagenous colitis: Secondary | ICD-10-CM | POA: Diagnosis not present

## 2018-04-11 DIAGNOSIS — M159 Polyosteoarthritis, unspecified: Secondary | ICD-10-CM

## 2018-04-11 DIAGNOSIS — Z87891 Personal history of nicotine dependence: Secondary | ICD-10-CM | POA: Diagnosis not present

## 2018-04-11 DIAGNOSIS — F418 Other specified anxiety disorders: Secondary | ICD-10-CM

## 2018-04-11 DIAGNOSIS — M8949 Other hypertrophic osteoarthropathy, multiple sites: Secondary | ICD-10-CM

## 2018-04-11 MED ORDER — PREDNISONE 2.5 MG PO TABS: ORAL_TABLET | ORAL | 0 refills | Status: DC

## 2018-04-11 MED ORDER — ESCITALOPRAM OXALATE 5 MG PO TABS: 5.0000 mg | ORAL_TABLET | Freq: Every day | ORAL | 5 refills | Status: DC

## 2018-04-11 NOTE — Assessment & Plan Note (Addendum)
rec continue to hold meloxicam, will try tylenol arthritis strength.  celebrex not a good option given colitis and strong fmhx CAD.

## 2018-04-11 NOTE — Assessment & Plan Note (Signed)
Did not tolerate wellbutrin. I'm hesitant to restart sertraline given microscopic colitis (most likely SSRI to trigger flare). Will start lexapro 5mg  low dose.

## 2018-04-11 NOTE — Patient Instructions (Addendum)
I'm glad colitis symptoms are improving. Continue prednisone taper 7.5mg  for 1 week then 5mg  for 1 week then 2.5mg  for 1 week.  Start lexapro 5mg  daily for mood.  Try tylenol arthritis strength.  Return in 6 weeks for follow up visit.

## 2018-04-11 NOTE — Assessment & Plan Note (Signed)
Notes improvement in symptoms. Will continue 6 wk taper - start 7.5mg  dose this week, then 5mg  for 1 wk then 2.5mg  then stop. Pt agrees with plan.

## 2018-04-11 NOTE — Progress Notes (Signed)
BP 118/66 (BP Location: Left Arm, Patient Position: Sitting, Cuff Size: Normal)   Pulse 67   Temp 97.9 F (36.6 C) (Oral)   Ht 5' 4.75" (1.645 m)   Wt 135 lb 8 oz (61.5 kg)   SpO2 95%   BMI 22.72 kg/m    CC: 3 wk f/u visit Subjective:    Patient ID: Elizabeth Long, female    DOB: Jan 24, 1961, 57 y.o.   MRN: [id number]  HPI: Elizabeth Long is a 57 y.o. female presenting on 04/11/2018 for Follow-up (Here for 3 wk microscopic colitis f/u.) and Discuss Medication (Wants to discuss Wellbutrin. Says she does better on Zoloft. Also, requesting alternative to meloxicam. Something to help arthritis but won't upset GI. )   See prior note for details. Here for f/u after starting prednisone taper for presumed microscopic colitis, collagenous type by biopsy 2013 (unable to afford GI eval or budesonide). Planned prednisone taper 40/20/15/10/7.5/5/2.5 over 1.5 months for microscopic colitis flare. Colitis symptoms have improved. 4 formed stools/day. No more diarrhea or blood in stool.  Last visit we also started wellbutrin SR 100mg  bid for increased depression off effexor. Would like to return to zoloft as she had trouble tolerating wellbutrin - increased irritability, anger outbursts.   OA - would like to discuss alternative to meloxicam. Tylenol not helpful. Not interested in celebrex at this time.   Father just had open heart surgery after pacemaker, with complications.   Relevant past medical, surgical, family and social history reviewed and updated as indicated. Interim medical history since our last visit reviewed. Allergies and medications reviewed and updated. Outpatient Medications Prior to Visit  Medication Sig Dispense Refill  . cetirizine (ZYRTEC) 10 MG tablet Take 10 mg by mouth daily as needed for allergies.    . fluticasone (FLONASE) 50 MCG/ACT nasal spray Place 2 sprays into both nostrils daily. 16 g 1  . gabapentin (NEURONTIN) 100 MG capsule TAKE ONE CAPSULE BY MOUTH 3 TIMES A DAY  AS NEEDED 90 capsule 2  . hydrochlorothiazide (MICROZIDE) 12.5 MG capsule TAKE 1 CAPSULE DAILY 90 capsule 0  . LORazepam (ATIVAN) 2 MG tablet Take 0.5 tablets (1 mg total) by mouth at bedtime.  0  . omeprazole (PRILOSEC) 20 MG capsule Take 1 capsule (20 mg total) by mouth daily. 90 capsule 3  . traMADol (ULTRAM) 50 MG tablet Take 50 mg by mouth at bedtime as needed for moderate pain.     Marland Kitchen dicyclomine (BENTYL) 10 MG capsule Take 1-2 capsules (10-20 mg total) by mouth 3 (three) times daily before meals. (Patient not taking: Reported on 04/11/2018) 30 capsule 3  . Multiple Vitamins-Minerals (CENTRUM SILVER 50+WOMEN) TABS Take 1 tablet by mouth daily.    Marland Kitchen buPROPion (WELLBUTRIN SR) 100 MG 12 hr tablet Take 1 tablet (100 mg total) by mouth 2 (two) times daily. (Patient not taking: Reported on 04/11/2018) 60 tablet 6  . meloxicam (MOBIC) 7.5 MG tablet Take 1 tablet (7.5 mg total) by mouth daily as needed for pain. (Patient not taking: Reported on 04/11/2018) 90 tablet 1  . predniSONE (DELTASONE) 20 MG tablet Take two tablets daily for 7 days followed by one tablet daily for 7 days - then fill 5mg  prednisone course 21 tablet 0  . predniSONE (DELTASONE) 5 MG tablet Start after completing 20mg  dose. Take 3 tablets daily 15mg  for 1 week then 2 tablets daily 10mg  for 1 week. Office visit prior to next refill 35 tablet 0   No facility-administered medications prior  to visit.      Per HPI unless specifically indicated in ROS section below Review of Systems     Objective:    BP 118/66 (BP Location: Left Arm, Patient Position: Sitting, Cuff Size: Normal)   Pulse 67   Temp 97.9 F (36.6 C) (Oral)   Ht 5' 4.75" (1.645 m)   Wt 135 lb 8 oz (61.5 kg)   SpO2 95%   BMI 22.72 kg/m   Wt Readings from Last 3 Encounters:  04/11/18 135 lb 8 oz (61.5 kg)  03/14/18 136 lb 4 oz (61.8 kg)  07/28/17 144 lb (65.3 kg)    Physical Exam  Constitutional: She appears well-developed and well-nourished. No distress.    Psychiatric: She has a normal mood and affect.  Nursing note and vitals reviewed.     Assessment & Plan:   Problem List Items Addressed This Visit    Osteoarthritis    rec continue to hold meloxicam, will try tylenol arthritis strength.  celebrex not a good option given colitis and strong fmhx CAD.       Relevant Medications   predniSONE (DELTASONE) 2.5 MG tablet   Microscopic colitis - Primary    Notes improvement in symptoms. Will continue 6 wk taper - start 7.5mg  dose this week, then 5mg  for 1 wk then 2.5mg  then stop. Pt agrees with plan.       Ex-smoker    Today marks 19 year anniversary off tobacco!      Anxiety associated with depression    Did not tolerate wellbutrin. I'm hesitant to restart sertraline given microscopic colitis (most likely SSRI to trigger flare). Will start lexapro 5mg  low dose.       Relevant Medications   escitalopram (LEXAPRO) 5 MG tablet       Meds ordered this encounter  Medications  . predniSONE (DELTASONE) 2.5 MG tablet    Sig: Take 7.5mg  for 1 week then 5mg  for 1 week then 2.5mg  for 1 week then stop    Dispense:  42 tablet    Refill:  0  . escitalopram (LEXAPRO) 5 MG tablet    Sig: Take 1 tablet (5 mg total) by mouth daily.    Dispense:  30 tablet    Refill:  5   No orders of the defined types were placed in this encounter.   Follow up plan: Return in about 6 weeks (around 05/23/2018) for follow up visit.  Ria Bush, MD

## 2018-04-11 NOTE — Assessment & Plan Note (Signed)
Today marks 19 year anniversary off tobacco!

## 2018-05-03 ENCOUNTER — Other Ambulatory Visit: Payer: Self-pay | Admitting: Family Medicine

## 2018-05-14 DIAGNOSIS — Z6823 Body mass index (BMI) 23.0-23.9, adult: Secondary | ICD-10-CM | POA: Diagnosis not present

## 2018-05-14 DIAGNOSIS — Z1231 Encounter for screening mammogram for malignant neoplasm of breast: Secondary | ICD-10-CM | POA: Diagnosis not present

## 2018-05-14 LAB — HM MAMMOGRAPHY

## 2018-05-21 ENCOUNTER — Encounter: Payer: Self-pay | Admitting: Family Medicine

## 2018-05-23 ENCOUNTER — Encounter: Payer: Self-pay | Admitting: Family Medicine

## 2018-05-23 ENCOUNTER — Ambulatory Visit: Payer: BLUE CROSS/BLUE SHIELD | Admitting: Family Medicine

## 2018-05-23 ENCOUNTER — Other Ambulatory Visit: Payer: Self-pay | Admitting: Family Medicine

## 2018-05-23 VITALS — BP 112/68 | HR 57 | Temp 98.4°F | Ht 64.75 in | Wt 142.5 lb

## 2018-05-23 DIAGNOSIS — F418 Other specified anxiety disorders: Secondary | ICD-10-CM | POA: Diagnosis not present

## 2018-05-23 DIAGNOSIS — M159 Polyosteoarthritis, unspecified: Secondary | ICD-10-CM

## 2018-05-23 DIAGNOSIS — M8949 Other hypertrophic osteoarthropathy, multiple sites: Secondary | ICD-10-CM

## 2018-05-23 DIAGNOSIS — M150 Primary generalized (osteo)arthritis: Secondary | ICD-10-CM | POA: Diagnosis not present

## 2018-05-23 DIAGNOSIS — K52831 Collagenous colitis: Secondary | ICD-10-CM | POA: Diagnosis not present

## 2018-05-23 MED ORDER — CELECOXIB 50 MG PO CAPS: 50.0000 mg | ORAL_CAPSULE | Freq: Two times a day (BID) | ORAL | 3 refills | Status: DC | PRN

## 2018-05-23 MED ORDER — DICYCLOMINE HCL 10 MG PO CAPS: 10.0000 mg | ORAL_CAPSULE | Freq: Three times a day (TID) | ORAL | 3 refills | Status: DC

## 2018-05-23 NOTE — Assessment & Plan Note (Signed)
Marked improvement on lexapro. Will continue 5mg . Consider decreasing to 2.5mg  (1/2 tab) if ongoing apathy.

## 2018-05-23 NOTE — Assessment & Plan Note (Signed)
Will watch after completing prednisone taper. If recurrent diarrhea, will recommend return to GI.

## 2018-05-23 NOTE — Patient Instructions (Addendum)
Continue lexapro 5mg  for now - I'm glad it's helping. Could try cutting in half.  Trial celebrex for joint pains.  Return in 2-3 months for follow up visit.

## 2018-05-23 NOTE — Telephone Encounter (Signed)
Gabapentin Last filled:  05/08/18, #90/2 Last OV:  Today, 05/23/18 Next OV:  none

## 2018-05-23 NOTE — Progress Notes (Signed)
BP 112/68 (BP Location: Left Arm, Patient Position: Sitting, Cuff Size: Normal)   Pulse (!) 57   Temp 98.4 F (36.9 C) (Oral)   Ht 5' 4.75" (1.645 m)   Wt 142 lb 8 oz (64.6 kg)   SpO2 97%   BMI 23.90 kg/m    CC: 6 wk f/u visit Subjective:    Patient ID: Elizabeth Long, female    DOB: 1960-06-21, 57 y.o.   MRN: [id number]  HPI: SHATANA SAXTON is a 57 y.o. female presenting on 05/23/2018 for Anxiety (Here for 6 wk f/u. )   See prior notes for details.  Microscopic colitis (collagenous) flare 03/2018 treated with prednisone taper, taper finished 2 wks ago. Overall improved but some recurrence of loose stools since she stopped prednisone taper. Possible stress contribution - father ill in and out of the hospital.   Anxiety with depression - trouble tolerating wellbutrin so last month we started lexapro 164m. Hesitant for sertraline given colitis hx. She feels significant improvement on lexapro but notices some apathy since starting medication - trouble with motivation to exercise etc. Overall given improvement desires to continue this regimen.   For OA - we recommended arthritis strength tylenol. This has not really helped. She did not feel any better while on prednisone. Pt was not interested in celebrex and wanted alternative to meloxicam. Shoulders, hips, legs, lower back. She is now interested in trial of celebrex. No redness/swelling or warmth of joints. Worse pain with rain.  Lab Results  Component Value Date   ESRSEDRATE 21 06/21/2011    She had normal treadmill stress test and ultrasound 04/2016 (Dr RHarrington Challenger.    Relevant past medical, surgical, family and social history reviewed and updated as indicated. Interim medical history since our last visit reviewed. Allergies and medications reviewed and updated. Outpatient Medications Prior to Visit  Medication Sig Dispense Refill  . cetirizine (ZYRTEC) 10 MG tablet Take 10 mg by mouth daily as needed for allergies.    .Marland Kitchenescitalopram  (LEXAPRO) 5 MG tablet TAKE 1 TABLET BY MOUTH EVERY DAY 90 tablet 2  . fluticasone (FLONASE) 50 MCG/ACT nasal spray Place 2 sprays into both nostrils daily. 16 g 1  . gabapentin (NEURONTIN) 100 MG capsule TAKE ONE CAPSULE BY MOUTH 3 TIMES A DAY AS NEEDED 90 capsule 2  . hydrochlorothiazide (MICROZIDE) 12.5 MG capsule TAKE 1 CAPSULE DAILY 90 capsule 0  . LORazepam (ATIVAN) 2 MG tablet Take 0.5 tablets (1 mg total) by mouth at bedtime.  0  . omeprazole (PRILOSEC) 20 MG capsule Take 1 capsule (20 mg total) by mouth daily. 90 capsule 3  . traMADol (ULTRAM) 50 MG tablet Take 50 mg by mouth at bedtime as needed for moderate pain.     .Marland Kitchendicyclomine (BENTYL) 10 MG capsule Take 1-2 capsules (10-20 mg total) by mouth 3 (three) times daily before meals. 30 capsule 3  . Multiple Vitamins-Minerals (CENTRUM SILVER 50+WOMEN) TABS Take 1 tablet by mouth daily.    . predniSONE (DELTASONE) 2.5 MG tablet Take 7.572mfor 1 week then 64m61mor 1 week then 2.64mg63mr 1 week then stop 42 tablet 0   No facility-administered medications prior to visit.      Per HPI unless specifically indicated in ROS section below Review of Systems     Objective:    BP 112/68 (BP Location: Left Arm, Patient Position: Sitting, Cuff Size: Normal)   Pulse (!) 57   Temp 98.4 F (36.9 C) (Oral)   Ht  5' 4.75" (1.645 m)   Wt 142 lb 8 oz (64.6 kg)   SpO2 97%   BMI 23.90 kg/m   Wt Readings from Last 3 Encounters:  05/23/18 142 lb 8 oz (64.6 kg)  04/11/18 135 lb 8 oz (61.5 kg)  03/14/18 136 lb 4 oz (61.8 kg)    Physical Exam Vitals signs and nursing note reviewed.  Constitutional:      General: She is not in acute distress.    Appearance: Normal appearance.  Skin:    General: Skin is warm and dry.     Findings: No rash.  Neurological:     Mental Status: She is alert.  Psychiatric:        Mood and Affect: Mood normal.      Results for orders placed or performed in visit on 05/21/18  HM MAMMOGRAPHY  Result Value Ref Range     HM Mammogram 0-4 Bi-Rad 0-4 Bi-Rad, Self Reported Normal   Depression screen [facility] 2/9 05/23/2018 03/14/2018 03/01/2017 08/01/2016 06/09/2016  Decreased Interest 1 0 0 0 2  Down, Depressed, Hopeless 1 1 1 1 3   PHQ - 2 Score 2 1 1 1 5   Altered sleeping 2 3 - 1 3  Tired, decreased energy 2 0 - 1 3  Change in appetite 2 0 - 0 3  Feeling bad or failure about yourself  0 1 - 1 3  Trouble concentrating 0 1 - 1 2  Moving slowly or fidgety/restless 0 0 - 0 1  Suicidal thoughts 0 0 - 0 0  PHQ-9 Score 8 6 - 5 20  Difficult doing work/chores - - - Not difficult at all Very difficult    GAD 7 : Generalized Anxiety Score 05/23/2018 03/14/2018 08/01/2016 06/09/2016  Nervous, Anxious, on Edge 0 2 0 2  Control/stop worrying 0 3 1 3   Worry too much - different things 0 3 1 3   Trouble relaxing 0 3 1 2   Restless 0 1 0 0  Easily annoyed or irritable 0 2 0 2  Afraid - awful might happen 0 0 0 0  Total GAD 7 Score 0 14 3 12   Anxiety Difficulty - - - Very difficult      Assessment & Plan:   Problem List Items Addressed This Visit    Osteoarthritis    Not consistent with PMR as she didn't note any improvement with recent prednisone course. Regardless, consider ESR next labwork.  Tylenol ineffective for arthalgia control.  Will trial low dose celebrex - pt aware of higher cardiovascular risk with this medicine but agrees to try. Reviewed reassuring cardiovascular evaluation 2017.      Relevant Medications   celecoxib (CELEBREX) 50 MG capsule   Microscopic colitis    Will watch after completing prednisone taper. If recurrent diarrhea, will recommend return to GI.       Anxiety associated with depression - Primary    Marked improvement on lexapro. Will continue 31m. Consider decreasing to 2.557m(1/2 tab) if ongoing apathy.           Meds ordered this encounter  Medications  . celecoxib (CELEBREX) 50 MG capsule    Sig: Take 1 capsule (50 mg total) by mouth 2 (two) times daily as needed for pain.     Dispense:  40 capsule    Refill:  3  . dicyclomine (BENTYL) 10 MG capsule    Sig: Take 1-2 capsules (10-20 mg total) by mouth 3 (three) times daily before meals.  Dispense:  30 capsule    Refill:  3   No orders of the defined types were placed in this encounter.   Follow up plan: Return in about 3 months (around 08/22/2018) for follow up visit.  Ria Bush, MD

## 2018-05-23 NOTE — Assessment & Plan Note (Signed)
Not consistent with PMR as she didn't note any improvement with recent prednisone course. Regardless, consider ESR next labwork.  Tylenol ineffective for arthalgia control.  Will trial low dose celebrex - pt aware of higher cardiovascular risk with this medicine but agrees to try. Reviewed reassuring cardiovascular evaluation 2017.

## 2018-06-07 ENCOUNTER — Encounter: Payer: Self-pay | Admitting: Family Medicine

## 2018-06-10 ENCOUNTER — Encounter: Payer: Self-pay | Admitting: Family Medicine

## 2018-06-10 DIAGNOSIS — M8949 Other hypertrophic osteoarthropathy, multiple sites: Secondary | ICD-10-CM

## 2018-06-10 DIAGNOSIS — M150 Primary generalized (osteo)arthritis: Principal | ICD-10-CM

## 2018-06-10 DIAGNOSIS — M159 Polyosteoarthritis, unspecified: Secondary | ICD-10-CM

## 2018-06-12 MED ORDER — MELOXICAM 7.5 MG PO TABS: 7.5000 mg | ORAL_TABLET | Freq: Every day | ORAL | 6 refills | Status: DC

## 2018-06-12 NOTE — Telephone Encounter (Signed)
See later mychart message 

## 2018-06-15 ENCOUNTER — Other Ambulatory Visit: Payer: Self-pay

## 2018-06-15 MED ORDER — OMEPRAZOLE 20 MG PO CPDR: 20.0000 mg | DELAYED_RELEASE_CAPSULE | Freq: Every day | ORAL | 2 refills | Status: DC

## 2018-06-15 NOTE — Telephone Encounter (Signed)
Request from [facility] for new omeprazole rx.   E-scribed refill

## 2018-09-15 ENCOUNTER — Encounter: Payer: Self-pay | Admitting: Family Medicine

## 2018-09-17 MED ORDER — OMEPRAZOLE 20 MG PO CPDR: 20.0000 mg | DELAYED_RELEASE_CAPSULE | Freq: Every day | ORAL | 1 refills | Status: DC

## 2018-09-17 MED ORDER — HYDROCHLOROTHIAZIDE 12.5 MG PO CAPS: 12.5000 mg | ORAL_CAPSULE | Freq: Every day | ORAL | 1 refills | Status: DC

## 2018-09-17 NOTE — Telephone Encounter (Signed)
Updated pt's pharmacy list. E-scribed requested refills. Notified pt via [place].

## 2018-10-06 ENCOUNTER — Telehealth: Payer: Self-pay | Admitting: Family Medicine

## 2018-10-06 NOTE — Telephone Encounter (Signed)
I did the refill the pt was asking for. In his last OV note in December, he had stated pt needed to come back in 2-3 months. Please call pt to schedule virtual visit. Thanks

## 2018-10-09 NOTE — Telephone Encounter (Signed)
Called pt and she didn't know why she would need a virtual visit b/c the medicine she was supposed to go on wasn't approved by her insurance so she end up not taking it. She said she is not having any problems and would rather not have another bill if it isn't necessary. She did schedule her physical for Oct.

## 2018-10-10 ENCOUNTER — Other Ambulatory Visit: Payer: Self-pay | Admitting: Family Medicine

## 2018-11-03 ENCOUNTER — Other Ambulatory Visit: Payer: Self-pay | Admitting: Family Medicine

## 2018-11-29 ENCOUNTER — Other Ambulatory Visit: Payer: Self-pay | Admitting: Family Medicine

## 2018-12-19 ENCOUNTER — Other Ambulatory Visit: Payer: Self-pay | Admitting: Family Medicine

## 2018-12-27 ENCOUNTER — Other Ambulatory Visit: Payer: Self-pay | Admitting: Family Medicine

## 2019-03-03 ENCOUNTER — Other Ambulatory Visit: Payer: Self-pay | Admitting: Family Medicine

## 2019-03-09 ENCOUNTER — Encounter: Payer: Self-pay | Admitting: Family Medicine

## 2019-03-12 ENCOUNTER — Other Ambulatory Visit: Payer: Self-pay | Admitting: Family Medicine

## 2019-03-12 DIAGNOSIS — I10 Essential (primary) hypertension: Secondary | ICD-10-CM

## 2019-03-12 DIAGNOSIS — M2550 Pain in unspecified joint: Secondary | ICD-10-CM

## 2019-03-12 DIAGNOSIS — R7989 Other specified abnormal findings of blood chemistry: Secondary | ICD-10-CM

## 2019-03-13 ENCOUNTER — Other Ambulatory Visit: Payer: Self-pay

## 2019-03-13 ENCOUNTER — Other Ambulatory Visit (INDEPENDENT_AMBULATORY_CARE_PROVIDER_SITE_OTHER): Payer: BC Managed Care – PPO

## 2019-03-13 DIAGNOSIS — R7989 Other specified abnormal findings of blood chemistry: Secondary | ICD-10-CM | POA: Diagnosis not present

## 2019-03-13 DIAGNOSIS — I10 Essential (primary) hypertension: Secondary | ICD-10-CM | POA: Diagnosis not present

## 2019-03-13 DIAGNOSIS — M2550 Pain in unspecified joint: Secondary | ICD-10-CM

## 2019-03-13 LAB — LIPID PANEL
Cholesterol: 207 mg/dL — ABNORMAL HIGH (ref 0–200)
HDL: 66 mg/dL (ref >39.00)
LDL Cholesterol: 124 mg/dL — ABNORMAL HIGH (ref 0–99)
NonHDL: 140.7
Total CHOL/HDL Ratio: 3
Triglycerides: 84 mg/dL (ref 0.0–149.0)
VLDL: 16.8 mg/dL (ref 0.0–40.0)

## 2019-03-13 LAB — MICROALBUMIN / CREATININE URINE RATIO
Creatinine,U: 92.8 mg/dL
Microalb Creat Ratio: 0.8 mg/g (ref 0.0–30.0)
Microalb, Ur: <0.7 mg/dL (ref 0.0–1.9)

## 2019-03-13 LAB — SEDIMENTATION RATE: Sed Rate: 12 mm/hr (ref 0–30)

## 2019-03-13 LAB — BASIC METABOLIC PANEL
BUN: 13 mg/dL (ref 6–23)
CO2: 31 mEq/L (ref 19–32)
Calcium: 10.2 mg/dL (ref 8.4–10.5)
Chloride: 100 mEq/L (ref 96–112)
Creatinine, Ser: 0.8 mg/dL (ref 0.40–1.20)
GFR: 73.59 mL/min (ref >60.00)
Glucose, Bld: 96 mg/dL (ref 70–99)
Potassium: 4.1 mEq/L (ref 3.5–5.1)
Sodium: 139 mEq/L (ref 135–145)

## 2019-03-14 LAB — RHEUMATOID FACTOR: Rheumatoid fact SerPl-aCnc: <14 IU/mL (ref <14)

## 2019-03-19 ENCOUNTER — Ambulatory Visit (INDEPENDENT_AMBULATORY_CARE_PROVIDER_SITE_OTHER): Payer: BC Managed Care – PPO | Admitting: Family Medicine

## 2019-03-19 ENCOUNTER — Encounter: Payer: Self-pay | Admitting: Family Medicine

## 2019-03-19 ENCOUNTER — Other Ambulatory Visit: Payer: Self-pay

## 2019-03-19 VITALS — BP 104/72 | HR 65 | Temp 97.3°F | Ht 65.0 in | Wt 142.0 lb

## 2019-03-19 DIAGNOSIS — K52831 Collagenous colitis: Secondary | ICD-10-CM

## 2019-03-19 DIAGNOSIS — K219 Gastro-esophageal reflux disease without esophagitis: Secondary | ICD-10-CM | POA: Diagnosis not present

## 2019-03-19 DIAGNOSIS — M159 Polyosteoarthritis, unspecified: Secondary | ICD-10-CM

## 2019-03-19 DIAGNOSIS — Z23 Encounter for immunization: Secondary | ICD-10-CM | POA: Diagnosis not present

## 2019-03-19 DIAGNOSIS — M7061 Trochanteric bursitis, right hip: Secondary | ICD-10-CM | POA: Insufficient documentation

## 2019-03-19 DIAGNOSIS — M8949 Other hypertrophic osteoarthropathy, multiple sites: Secondary | ICD-10-CM

## 2019-03-19 DIAGNOSIS — F418 Other specified anxiety disorders: Secondary | ICD-10-CM | POA: Diagnosis not present

## 2019-03-19 DIAGNOSIS — I10 Essential (primary) hypertension: Secondary | ICD-10-CM

## 2019-03-19 DIAGNOSIS — Z0001 Encounter for general adult medical examination with abnormal findings: Secondary | ICD-10-CM | POA: Diagnosis not present

## 2019-03-19 DIAGNOSIS — M7062 Trochanteric bursitis, left hip: Secondary | ICD-10-CM

## 2019-03-19 MED ORDER — MELOXICAM 15 MG PO TABS: 15.0000 mg | ORAL_TABLET | Freq: Every day | ORAL | 1 refills | Status: DC

## 2019-03-19 MED ORDER — ESCITALOPRAM OXALATE 5 MG PO TABS: 5.0000 mg | ORAL_TABLET | Freq: Every day | ORAL | 3 refills | Status: DC

## 2019-03-19 MED ORDER — MELOXICAM 15 MG PO TABS: 15.0000 mg | ORAL_TABLET | Freq: Every day | ORAL | 6 refills | Status: DC

## 2019-03-19 NOTE — Assessment & Plan Note (Signed)
Worsening irritability - desires to increase lexapro back to 5mg  daily.

## 2019-03-19 NOTE — Progress Notes (Signed)
This visit was conducted in person.  BP 104/72   Pulse 65   Temp (!) 97.3 F (36.3 C)   Ht 5' 5"  (1.651 m)   Wt 142 lb (64.4 kg)   SpO2 99%   BMI 23.63 kg/m   BP Readings from Last 3 Encounters:  03/19/19 104/72  05/23/18 112/68  04/11/18 118/66    CC: CPE Subjective:    Patient ID: Elizabeth Long, female    DOB: 26-Feb-1961, 58 y.o.   MRN: [id number]  HPI: Elizabeth Long is a 58 y.o. female presenting on 03/19/2019 for Annual Exam   Ongoing arthritis pain at bilateral lateral hips into bilateral groin as well as behind knees and thumbs. Requests increasing meloxicam to 43m daily.   Chronic neck pain - followed by Dr CCyndy Freeze   Doing well on lexapro 2.5689mdaily but notes irritability. Requests increasing back to 89m6maily.   Reviewed bleeding risk with NSAID + SSRI  Microscopic colitis - ongoing diarrhea more noticed in am - has not returned to see GI.   Preventative: Colonoscopy 09/2016 - 33m71m, rpt 3 yrs (Nandigam) Well woman with OBGYN Dr Wein10/2019 - upcoming appt,s/p hysterectomy (endometriosis). Ovaries remain. Breast cyst aspirated on Monday by Dr WeinStann Mainlandending results. Planned mammo next month. Mammogramlast 05/2018 birads1 -yearly,dense breasts s/p 3D mammogram.  Flu shot yealry Tetanus - unsure >10 yrs ago. Tdap9/2017 Seat belt use discussed Sunscreen use discussed. No changing moles on skin.  Ex smoker- quit 2000 Alcohol use - none - sober since 1994.  Dentist yearly  Eye exam yearly   Daily caffeine3-4 cups Lives alone Occ: [employer]: HS Activity: goes to gym regularly 3x/wk  Diet: good water, poor fruits/vegetables, sweets     Relevant past medical, surgical, family and social history reviewed and updated as indicated. Interim medical history since our last visit reviewed. Allergies and medications reviewed and updated. Outpatient Medications Prior to Visit  Medication Sig Dispense Refill  .  cetirizine (ZYRTEC) 10 MG tablet Take 10 mg by mouth daily as needed for allergies.    . fluticasone (FLONASE) 50 MCG/ACT nasal spray Place 2 sprays into both nostrils daily. 16 g 1  . gabapentin (NEURONTIN) 100 MG capsule TAKE 1 CAPSULE BY MOUTH THREE TIMES A DAY AS NEEDED 270 capsule 0  . hydrochlorothiazide (MICROZIDE) 12.5 MG capsule TAKE 1 CAPSULE BY MOUTH EVERY DAY 90 capsule 2  . LORazepam (ATIVAN) 2 MG tablet Take 0.5 tablets (1 mg total) by mouth at bedtime.  0  . Multiple Vitamins-Minerals (CENTRUM SILVER 50+WOMEN) TABS Take 1 tablet by mouth daily.    . omMarland Kitchenprazole (PRILOSEC) 20 MG capsule TAKE 1 CAPSULE BY MOUTH EVERY DAY 90 capsule 2  . traMADol (ULTRAM) 50 MG tablet Take 50 mg by mouth at bedtime as needed for moderate pain.     . esMarland Kitchenitalopram (LEXAPRO) 5 MG tablet Take 0.5 tablets (2.5 mg total) by mouth daily.  2  . meloxicam (MOBIC) 7.5 MG tablet TAKE 1 TABLET BY MOUTH EVERY DAY 30 tablet 5  . dicyclomine (BENTYL) 10 MG capsule TAKE 1-2 CAPSULES (10-20 MG TOTAL) BY MOUTH 3 (THREE) TIMES DAILY BEFORE MEALS. (Patient not taking: Reported on 03/19/2019) 30 capsule 0  . Lifitegrast (XIIDRA) 5 % SOLN Xiidra 5 % eye drops in a dropperette  INSTILL 1 DROP INTO EACH EYE TWICE DAILY     No facility-administered medications prior to visit.      Per HPI unless  specifically indicated in ROS section below Review of Systems  Constitutional: Negative for activity change, appetite change, chills, fatigue, fever and unexpected weight change.  HENT: Negative for hearing loss.   Eyes: Negative for visual disturbance.  Respiratory: Negative for cough, chest tightness, shortness of breath and wheezing.   Cardiovascular: Negative for chest pain, palpitations and leg swelling.  Gastrointestinal: Positive for abdominal pain and diarrhea. Negative for abdominal distention, blood in stool, constipation, nausea and vomiting.  Genitourinary: Negative for difficulty urinating and hematuria.   Musculoskeletal: Positive for arthralgias. Negative for myalgias and neck pain.  Skin: Negative for rash.  Neurological: Negative for dizziness, seizures, syncope and headaches.  Hematological: Negative for adenopathy. Does not bruise/bleed easily.  Psychiatric/Behavioral: Positive for dysphoric mood. The patient is not nervous/anxious.        Panic attack last week, attributes to mask use.    Objective:    BP 104/72   Pulse 65   Temp (!) 97.3 F (36.3 C)   Ht 5' 5"  (1.651 m)   Wt 142 lb (64.4 kg)   SpO2 99%   BMI 23.63 kg/m   Wt Readings from Last 3 Encounters:  03/19/19 142 lb (64.4 kg)  05/23/18 142 lb 8 oz (64.6 kg)  04/11/18 135 lb 8 oz (61.5 kg)    Physical Exam Vitals signs and nursing note reviewed.  Constitutional:      General: She is not in acute distress.    Appearance: Normal appearance. She is well-developed. She is not ill-appearing.  HENT:     Head: Normocephalic and atraumatic.     Right Ear: Hearing, tympanic membrane, ear canal and external ear normal.     Left Ear: Hearing, tympanic membrane, ear canal and external ear normal.     Nose: Nose normal.     Mouth/Throat:     Pharynx: Uvula midline. No oropharyngeal exudate or posterior oropharyngeal erythema.  Eyes:     General: No scleral icterus.    Conjunctiva/sclera: Conjunctivae normal.     Pupils: Pupils are equal, round, and reactive to light.  Neck:     Musculoskeletal: Normal range of motion and neck supple.  Cardiovascular:     Rate and Rhythm: Normal rate and regular rhythm.     Pulses:          Radial pulses are 2+ on the right side and 2+ on the left side.     Heart sounds: Normal heart sounds. No murmur.  Pulmonary:     Effort: Pulmonary effort is normal. No respiratory distress.     Breath sounds: Normal breath sounds. No wheezing or rales.  Abdominal:     General: Bowel sounds are normal. There is no distension.     Palpations: Abdomen is soft. There is no mass.     Tenderness:  There is no abdominal tenderness. There is no guarding or rebound.  Musculoskeletal: Normal range of motion.     Comments:  No pain with int/ext rotation at hip. Tender to palpation bilateral trochanteric bursa  Lymphadenopathy:     Cervical: No cervical adenopathy.  Skin:    General: Skin is warm and dry.     Findings: No rash.  Neurological:     Mental Status: She is alert and oriented to person, place, and time.     Comments: CN grossly intact, station and gait intact  Psychiatric:        Behavior: Behavior normal.        Thought Content: Thought content normal.  Judgment: Judgment normal.       Results for orders placed or performed in visit on 03/13/19  Sedimentation rate  Result Value Ref Range   Sed Rate 12 0 - 30 mm/hr  Rheumatoid factor  Result Value Ref Range   Rhuematoid fact SerPl-aCnc <14 <14 IU/mL  Basic metabolic panel  Result Value Ref Range   Sodium 139 135 - 145 mEq/L   Potassium 4.1 3.5 - 5.1 mEq/L   Chloride 100 96 - 112 mEq/L   CO2 31 19 - 32 mEq/L   Glucose, Bld 96 70 - 99 mg/dL   BUN 13 6 - 23 mg/dL   Creatinine, Ser 0.80 0.40 - 1.20 mg/dL   Calcium 10.2 8.4 - 10.5 mg/dL   GFR 73.59 >60.00 mL/min  Microalbumin / creatinine urine ratio  Result Value Ref Range   Microalb, Ur <0.7 0.0 - 1.9 mg/dL   Creatinine,U 92.8 mg/dL   Microalb Creat Ratio 0.8 0.0 - 30.0 mg/g  Lipid panel  Result Value Ref Range   Cholesterol 207 (H) 0 - 200 mg/dL   Triglycerides 84.0 0.0 - 149.0 mg/dL   HDL 66.00 >39.00 mg/dL   VLDL 16.8 0.0 - 40.0 mg/dL   LDL Cholesterol 124 (H) 0 - 99 mg/dL   Total CHOL/HDL Ratio 3    NonHDL 140.70    Assessment & Plan:   Problem List Items Addressed This Visit    Trochanteric bursitis of both hips    Story/exam most consistent with this. Continue meloxicam, try exercises provided from [facility] pt advisor, f/u with Dr Lorelei Pont for eval if not improved with above.      Osteoarthritis    ESR, RF normal. Anticipate OA  Previous  prednisone course did not improve symptoms pointing against PMR.       Relevant Medications   meloxicam (MOBIC) 15 MG tablet   Microscopic colitis    Endorses ongoing diarrhea - encouraged GI f/u.       GERD (gastroesophageal reflux disease)    Continue omeprazole 29m daily.      Essential hypertension    Chronic, stable. On low dose hctz. BP low normal today.       Encounter for routine adult medical exam with abnormal findings    Preventative protocols reviewed and updated unless pt declined. Discussed healthy diet and lifestyle.       Anxiety associated with depression    Worsening irritability - desires to increase lexapro back to 567mdaily.       Relevant Medications   escitalopram (LEXAPRO) 5 MG tablet    Other Visit Diagnoses    Need for influenza vaccination    -  Primary   Relevant Orders   Flu Vaccine QUAD 6+ mos PF IM (Fluarix Quad PF) (Completed)       Meds ordered this encounter  Medications  . DISCONTD: meloxicam (MOBIC) 15 MG tablet    Sig: Take 1 tablet (15 mg total) by mouth daily.    Dispense:  30 tablet    Refill:  6  . escitalopram (LEXAPRO) 5 MG tablet    Sig: Take 1 tablet (5 mg total) by mouth daily.    Dispense:  90 tablet    Refill:  3  . meloxicam (MOBIC) 15 MG tablet    Sig: Take 1 tablet (15 mg total) by mouth daily.    Dispense:  90 tablet    Refill:  1   Orders Placed This Encounter  Procedures  . Flu Vaccine  QUAD 6+ mos PF IM (Fluarix Quad PF)    Follow up plan: Return in about 1 year (around 03/18/2020) for annual exam, prior fasting for blood work.  Ria Bush, MD

## 2019-03-19 NOTE — Assessment & Plan Note (Signed)
Story/exam most consistent with this. Continue meloxicam, try exercises provided from [facility] pt advisor, f/u with Dr Lorelei Pont for eval if not improved with above.

## 2019-03-19 NOTE — Assessment & Plan Note (Signed)
ESR, RF normal. Anticipate OA  Previous prednisone course did not improve symptoms pointing against PMR.

## 2019-03-19 NOTE — Assessment & Plan Note (Signed)
Preventative protocols reviewed and updated unless pt declined. Discussed healthy diet and lifestyle.  

## 2019-03-19 NOTE — Assessment & Plan Note (Addendum)
Chronic, stable. On low dose hctz. BP low normal today.

## 2019-03-19 NOTE — Assessment & Plan Note (Signed)
Endorses ongoing diarrhea - encouraged GI f/u.

## 2019-03-19 NOTE — Assessment & Plan Note (Signed)
-  Continue omeprazole 20 mg daily

## 2019-03-19 NOTE — Patient Instructions (Addendum)
Flu shot today Follow up with GI if ongoing diarrhea.  For hip bursitis, do exercises provided today. If not improving, schedule appointment with Dr Lorelei Pont to discuss possible steroid injections into bursa.  You are doing well today Ok to increase meloxicam to 15mg  daily, increase lexapro to 5mg  daily. Watch GI symptoms on these medicines.  Health Maintenance for Postmenopausal Women Menopause is a normal process in which your ability to get pregnant comes to an end. This process happens slowly over many months or years, usually between the ages of 10 and 74. Menopause is complete when you have missed your menstrual periods for 12 months. It is important to talk with your health care provider about some of the most common conditions that affect women after menopause (postmenopausal women). These include heart disease, cancer, and bone loss (osteoporosis). Adopting a healthy lifestyle and getting preventive care can help to promote your health and wellness. The actions you take can also lower your chances of developing some of these common conditions. What should I know about menopause? During menopause, you may get a number of symptoms, such as:  Hot flashes. These can be moderate or severe.  Night sweats.  Decrease in sex drive.  Mood swings.  Headaches.  Tiredness.  Irritability.  Memory problems.  Insomnia. Choosing to treat or not to treat these symptoms is a decision that you make with your health care provider. Do I need hormone replacement therapy?  Hormone replacement therapy is effective in treating symptoms that are caused by menopause, such as hot flashes and night sweats.  Hormone replacement carries certain risks, especially as you become older. If you are thinking about using estrogen or estrogen with progestin, discuss the benefits and risks with your health care provider. What is my risk for heart disease and stroke? The risk of heart disease, heart attack, and  stroke increases as you age. One of the causes may be a change in the body's hormones during menopause. This can affect how your body uses dietary fats, triglycerides, and cholesterol. Heart attack and stroke are medical emergencies. There are many things that you can do to help prevent heart disease and stroke. Watch your blood pressure  High blood pressure causes heart disease and increases the risk of stroke. This is more likely to develop in people who have high blood pressure readings, are of African descent, or are overweight.  Have your blood pressure checked: ? Every 3-5 years if you are 52-21 years of age. ? Every year if you are 58 years old or older. Eat a healthy diet   Eat a diet that includes plenty of vegetables, fruits, low-fat dairy products, and lean protein.  Do not eat a lot of foods that are high in solid fats, added sugars, or sodium. Get regular exercise Get regular exercise. This is one of the most important things you can do for your health. Most adults should:  Try to exercise for at least 150 minutes each week. The exercise should increase your heart rate and make you sweat (moderate-intensity exercise).  Try to do strengthening exercises at least twice each week. Do these in addition to the moderate-intensity exercise.  Spend less time sitting. Even light physical activity can be beneficial. Other tips  Work with your health care provider to achieve or maintain a healthy weight.  Do not use any products that contain nicotine or tobacco, such as cigarettes, e-cigarettes, and chewing tobacco. If you need help quitting, ask your health care provider.  Know your numbers. Ask your health care provider to check your cholesterol and your blood sugar (glucose). Continue to have your blood tested as directed by your health care provider. Do I need screening for cancer? Depending on your health history and family history, you may need to have cancer screening at  different stages of your life. This may include screening for:  Breast cancer.  Cervical cancer.  Lung cancer.  Colorectal cancer. What is my risk for osteoporosis? After menopause, you may be at increased risk for osteoporosis. Osteoporosis is a condition in which bone destruction happens more quickly than new bone creation. To help prevent osteoporosis or the bone fractures that can happen because of osteoporosis, you may take the following actions:  If you are 22-43 years old, get at least 1,000 mg of calcium and at least 600 mg of vitamin D per day.  If you are older than age 28 but younger than age 61, get at least 1,200 mg of calcium and at least 600 mg of vitamin D per day.  If you are older than age 57, get at least 1,200 mg of calcium and at least 800 mg of vitamin D per day. Smoking and drinking excessive alcohol increase the risk of osteoporosis. Eat foods that are rich in calcium and vitamin D, and do weight-bearing exercises several times each week as directed by your health care provider. How does menopause affect my mental health? Depression may occur at any age, but it is more common as you become older. Common symptoms of depression include:  Low or sad mood.  Changes in sleep patterns.  Changes in appetite or eating patterns.  Feeling an overall lack of motivation or enjoyment of activities that you previously enjoyed.  Frequent crying spells. Talk with your health care provider if you think that you are experiencing depression. General instructions See your health care provider for regular wellness exams and vaccines. This may include:  Scheduling regular health, dental, and eye exams.  Getting and maintaining your vaccines. These include: ? Influenza vaccine. Get this vaccine each year before the flu season begins. ? Pneumonia vaccine. ? Shingles vaccine. ? Tetanus, diphtheria, and pertussis (Tdap) booster vaccine. Your health care provider may also  recommend other immunizations. Tell your health care provider if you have ever been abused or do not feel safe at home. Summary  Menopause is a normal process in which your ability to get pregnant comes to an end.  This condition causes hot flashes, night sweats, decreased interest in sex, mood swings, headaches, or lack of sleep.  Treatment for this condition may include hormone replacement therapy.  Take actions to keep yourself healthy, including exercising regularly, eating a healthy diet, watching your weight, and checking your blood pressure and blood sugar levels.  Get screened for cancer and depression. Make sure that you are up to date with all your vaccines. This information is not intended to replace advice given to you by your health care provider. Make sure you discuss any questions you have with your health care provider. Document Released: 07/15/2005 Document Revised: 05/16/2018 Document Reviewed: 05/16/2018 Elsevier Patient Education  2020 [employer].

## 2019-04-05 ENCOUNTER — Other Ambulatory Visit: Payer: Self-pay | Admitting: Family Medicine

## 2019-04-05 NOTE — Telephone Encounter (Signed)
Will you address in Dr. Synthia Innocent absence?  Bentyl last filled:  10/06/18, #30/0 Gabapentin last filled:  12/27/18, #270/0 Last OV:  03/19/19, CPE Next OV:  none

## 2019-04-07 NOTE — Telephone Encounter (Signed)
Sent. Thanks.   

## 2019-05-20 DIAGNOSIS — Z01419 Encounter for gynecological examination (general) (routine) without abnormal findings: Secondary | ICD-10-CM | POA: Diagnosis not present

## 2019-05-20 DIAGNOSIS — Z6824 Body mass index (BMI) 24.0-24.9, adult: Secondary | ICD-10-CM | POA: Diagnosis not present

## 2019-05-20 DIAGNOSIS — Z1231 Encounter for screening mammogram for malignant neoplasm of breast: Secondary | ICD-10-CM | POA: Diagnosis not present

## 2019-05-20 LAB — HM MAMMOGRAPHY

## 2019-05-29 ENCOUNTER — Encounter: Payer: Self-pay | Admitting: Family Medicine

## 2019-07-14 ENCOUNTER — Other Ambulatory Visit: Payer: Self-pay | Admitting: Family Medicine

## 2019-07-17 ENCOUNTER — Other Ambulatory Visit: Payer: Self-pay

## 2019-07-17 ENCOUNTER — Ambulatory Visit: Payer: BC Managed Care – PPO | Admitting: Family Medicine

## 2019-07-17 ENCOUNTER — Encounter: Payer: Self-pay | Admitting: Family Medicine

## 2019-07-17 ENCOUNTER — Ambulatory Visit (INDEPENDENT_AMBULATORY_CARE_PROVIDER_SITE_OTHER)
Admission: RE | Admit: 2019-07-17 | Discharge: 2019-07-17 | Disposition: A | Discharge status: Home / Self Care | Payer: BC Managed Care – PPO | Source: Ambulatory Visit | Attending: Family Medicine | Admitting: Family Medicine

## 2019-07-17 VITALS — BP 118/70 | HR 61 | Temp 97.8°F | Ht 65.0 in | Wt 148.4 lb

## 2019-07-17 DIAGNOSIS — R29898 Other symptoms and signs involving the musculoskeletal system: Secondary | ICD-10-CM

## 2019-07-17 DIAGNOSIS — M5416 Radiculopathy, lumbar region: Secondary | ICD-10-CM | POA: Diagnosis not present

## 2019-07-17 DIAGNOSIS — R937 Abnormal findings on diagnostic imaging of other parts of musculoskeletal system: Secondary | ICD-10-CM

## 2019-07-17 DIAGNOSIS — M545 Low back pain: Secondary | ICD-10-CM | POA: Diagnosis not present

## 2019-07-17 MED ORDER — DEXAMETHASONE SODIUM PHOSPHATE 10 MG/ML IJ SOLN
10.0000 mg | Freq: Once | INTRAMUSCULAR | Status: AC
  Administered 2019-07-17: 10 mg via INTRAMUSCULAR

## 2019-07-17 NOTE — Progress Notes (Signed)
This visit was conducted in person.  BP 118/70 (BP Location: Right Arm, Patient Position: Sitting, Cuff Size: Normal)   Pulse 61   Temp 97.8 F (36.6 C) (Temporal)   Ht 5\' 5"  (1.651 m)   Wt 148 lb 6 oz (67.3 kg)   SpO2 99%   BMI 24.69 kg/m    CC: back pain Subjective:    Patient ID: Elizabeth Long, female    DOB: 1961-05-06, 59 y.o.   MRN: [id number]  HPI: Elizabeth Long is a 59 y.o. female presenting on 07/17/2019 for Back Pain (C/o low back pain.  Also, c/o numbness down right leg down to the foot. States she threw back out on 07/07/19 reaching down to pick up something from the floor.   Tried heat therapy. )   DOI: 07/07/2019 Back gave out when bending over to lift food from the floor.  Ongoing lower back pain with radiation down R leg - R foot and leg feel numb.  No fevers/chills, bowel/bladder incontinence, leg or foot weakness.   She has continued working - using heating pad. Sleeping on heating pad. Continues meloxicam 15mg  daily. Continues tylenol and tramadol without benefit.  Has continued gabapentin 100mg  tid (known cervical HNP s/p ACDF 2006)  H/o lumbar disc surgery 2002 ([facility]) H/o cervical ACDF 2006     Relevant past medical, surgical, family and social history reviewed and updated as indicated. Interim medical history since our last visit reviewed. Allergies and medications reviewed and updated. Outpatient Medications Prior to Visit  Medication Sig Dispense Refill  . cetirizine (ZYRTEC) 10 MG tablet Take 10 mg by mouth daily as needed for allergies.    Marland Kitchen escitalopram (LEXAPRO) 5 MG tablet Take 1 tablet (5 mg total) by mouth daily. 90 tablet 3  . fluticasone (FLONASE) 50 MCG/ACT nasal spray Place 2 sprays into both nostrils daily. 16 g 1  . gabapentin (NEURONTIN) 100 MG capsule TAKE 1 CAPSULE BY MOUTH THREE TIMES A DAY AS NEEDED 270 capsule 0  . hydrochlorothiazide (MICROZIDE) 12.5 MG capsule TAKE 1 CAPSULE BY MOUTH EVERY DAY 90 capsule 2  . Lifitegrast  (XIIDRA) 5 % SOLN Xiidra 5 % eye drops in a dropperette  INSTILL 1 DROP INTO EACH EYE TWICE DAILY    . LORazepam (ATIVAN) 2 MG tablet Take 0.5 tablets (1 mg total) by mouth at bedtime.  0  . meloxicam (MOBIC) 15 MG tablet Take 1 tablet (15 mg total) by mouth daily. 90 tablet 1  . Multiple Vitamins-Minerals (CENTRUM SILVER 50+WOMEN) TABS Take 1 tablet by mouth daily.    Marland Kitchen omeprazole (PRILOSEC) 20 MG capsule TAKE 1 CAPSULE BY MOUTH EVERY DAY 90 capsule 2  . traMADol (ULTRAM) 50 MG tablet Take 50 mg by mouth at bedtime as needed for moderate pain.     Marland Kitchen dicyclomine (BENTYL) 10 MG capsule TAKE 1-2 CAPSULES (10-20 MG TOTAL) BY MOUTH 3 (THREE) TIMES DAILY BEFORE MEALS. (Patient not taking: Reported on 07/17/2019) 30 capsule 0   No facility-administered medications prior to visit.     Per HPI unless specifically indicated in ROS section below Review of Systems Objective:    BP 118/70 (BP Location: Right Arm, Patient Position: Sitting, Cuff Size: Normal)   Pulse 61   Temp 97.8 F (36.6 C) (Temporal)   Ht 5\' 5"  (1.651 m)   Wt 148 lb 6 oz (67.3 kg)   SpO2 99%   BMI 24.69 kg/m   Wt Readings from Last 3 Encounters:  07/17/19 148 lb  6 oz (67.3 kg)  03/19/19 142 lb (64.4 kg)  05/23/18 142 lb 8 oz (64.6 kg)    Physical Exam Vitals and nursing note reviewed.  Constitutional:      Appearance: Normal appearance. She is not ill-appearing.     Comments: Uncomfortable appearing  Musculoskeletal:        General: Tenderness present.     Right lower leg: No edema.     Left lower leg: No edema.     Comments:  No pain midline spine R lower lumbar paraspinous mm tenderness +SLR bilaterally. Discomfort with int/ext rotation at hip.  Skin:    General: Skin is warm and dry.     Findings: No rash.  Neurological:     Mental Status: She is alert.     Sensory: Sensation is intact.     Motor: Weakness present.     Deep Tendon Reflexes:     Reflex Scores:      Patellar reflexes are 1+ on the right  side and 3+ on the left side.      Achilles reflexes are 1+ on the right side and 1+ on the left side.    Comments:  Diminished strength R>L lower extremities 3/5 strength hip flexion, foot dorsi and plantar flexion on right       Results for orders placed or performed in visit on 05/29/19  HM MAMMOGRAPHY  Result Value Ref Range   HM Mammogram 0-4 Bi-Rad 0-4 Bi-Rad, Self Reported Normal   Assessment & Plan:  This visit occurred during the SARS-CoV-2 public health emergency.  Safety protocols were in place, including screening questions prior to the visit, additional usage of staff PPE, and extensive cleaning of exam room while observing appropriate contact time as indicated for disinfecting solutions.   Problem List Items Addressed This Visit    Right leg weakness    Noted on exam today.  Red flags to seek ER care reviewed.       Relevant Orders   DG Lumbar Spine Complete (Completed)   MR Lumbar Spine Wo Contrast (Completed)   Ambulatory referral to Neurosurgery   Ambulatory referral to Neurology   Acute right lumbar radiculopathy - Primary    Acute R leg pain for the last almost 2 wks associated with leg weakness/numbness and abnormal neurological exam (diminished patellar reflex on right, leg weakness, positive SLR R>L). Concern for herniated disc causing compression of R nerve root - will check lumbar MRI and likely refer to neurosurgery. In interim, treat with dexamethasone 10mg  IM today. Pt agrees with plan.       Relevant Orders   DG Lumbar Spine Complete (Completed)   MR Lumbar Spine Wo Contrast (Completed)   Ambulatory referral to Neurosurgery   Abnormal MRI, lumbar spine    ADDENDUM ==> Abnormal MRI - ?demyelinating disease - will refer to neurology for further evaluation.       Relevant Orders   Ambulatory referral to Neurology       Meds ordered this encounter  Medications  . dexamethasone (DECADRON) injection 10 mg   Orders Placed This Encounter   Procedures  . DG Lumbar Spine Complete    Standing Status:   Future    Number of Occurrences:   1    Standing Expiration Date:   09/13/2020    Order Specific Question:   Reason for Exam (SYMPTOM  OR DIAGNOSIS REQUIRED)    Answer:   R LBP with R radiculopathy and weakness    Order Specific  Question:   Is patient pregnant?    Answer:   No    Order Specific Question:   Preferred imaging location?    Answer:   [facility]    Order Specific Question:   Radiology Contrast Protocol - do NOT remove file path    Answer:   \\[email or web address]  . MR Lumbar Spine Wo Contrast    Do not hold patient Patient is authorized Slot ok per [place]    Standing Status:   Future    Number of Occurrences:   1    Standing Expiration Date:   09/13/2020    Order Specific Question:   ** REASON FOR EXAM (FREE TEXT)    Answer:   R lower back pain with radiculopathy and new RLE weakness    Order Specific Question:   What is the patient's sedation requirement?    Answer:   No Sedation    Order Specific Question:   Does the patient have a pacemaker or implanted devices?    Answer:   No    Order Specific Question:   Preferred imaging location?    Answer:   [facility] (table limit-400lbs)    Order Specific Question:   Call Results- Best Contact Number?    [phone] - md cell    Order Specific Question:   Radiology Contrast Protocol - do NOT remove file path    Answer:   \\[email or web address]  . Ambulatory referral to Neurosurgery    Referral Priority:   Routine    Referral Type:   Surgical    Referral Reason:   Specialty Services Required    Requested Specialty:   Neurosurgery    Number of Visits Requested:   1  . Ambulatory referral to Neurology    Referral Priority:   Routine    Referral Type:   Consultation    Referral Reason:   Specialty Services Required    Requested Specialty:   Neurology    Number of Visits  Requested:   1    Patient Instructions  I worry you have herniated disc causing pinching of a nerve. Xray today - we will also schedule MRI Steroid shot today for inflammation.  Rest and take it easy - if weakness or loss of bowel/bladder control or worsening numbness inner thighs, go to the ER.    Follow up plan: Return if symptoms worsen or fail to improve.  Ria Bush, MD

## 2019-07-17 NOTE — Patient Instructions (Addendum)
I worry you have herniated disc causing pinching of a nerve. Xray today - we will also schedule MRI Steroid shot today for inflammation.  Rest and take it easy - if weakness or loss of bowel/bladder control or worsening numbness inner thighs, go to the ER.

## 2019-07-18 ENCOUNTER — Ambulatory Visit
Admission: RE | Admit: 2019-07-18 | Discharge: 2019-07-18 | Disposition: A | Discharge status: Home / Self Care | Payer: BC Managed Care – PPO | Source: Ambulatory Visit | Attending: Family Medicine | Admitting: Family Medicine

## 2019-07-18 DIAGNOSIS — R29898 Other symptoms and signs involving the musculoskeletal system: Secondary | ICD-10-CM | POA: Diagnosis not present

## 2019-07-18 DIAGNOSIS — M5416 Radiculopathy, lumbar region: Secondary | ICD-10-CM

## 2019-07-18 DIAGNOSIS — M48061 Spinal stenosis, lumbar region without neurogenic claudication: Secondary | ICD-10-CM | POA: Diagnosis not present

## 2019-07-18 DIAGNOSIS — M5126 Other intervertebral disc displacement, lumbar region: Secondary | ICD-10-CM | POA: Diagnosis not present

## 2019-07-21 DIAGNOSIS — M5416 Radiculopathy, lumbar region: Secondary | ICD-10-CM | POA: Insufficient documentation

## 2019-07-21 DIAGNOSIS — G8929 Other chronic pain: Secondary | ICD-10-CM | POA: Insufficient documentation

## 2019-07-21 DIAGNOSIS — R29898 Other symptoms and signs involving the musculoskeletal system: Secondary | ICD-10-CM | POA: Insufficient documentation

## 2019-07-21 DIAGNOSIS — R937 Abnormal findings on diagnostic imaging of other parts of musculoskeletal system: Secondary | ICD-10-CM | POA: Insufficient documentation

## 2019-07-21 NOTE — Assessment & Plan Note (Addendum)
ADDENDUM ==> Abnormal MRI - ?demyelinating disease - will refer to neurology for further evaluation.

## 2019-07-21 NOTE — Assessment & Plan Note (Signed)
Acute R leg pain for the last almost 2 wks associated with leg weakness/numbness and abnormal neurological exam (diminished patellar reflex on right, leg weakness, positive SLR R>L). Concern for herniated disc causing compression of R nerve root - will check lumbar MRI and likely refer to neurosurgery. In interim, treat with dexamethasone 10mg  IM today. Pt agrees with plan.

## 2019-07-21 NOTE — Assessment & Plan Note (Signed)
Noted on exam today.  Red flags to seek ER care reviewed.

## 2019-07-23 DIAGNOSIS — I10 Essential (primary) hypertension: Secondary | ICD-10-CM | POA: Diagnosis not present

## 2019-07-23 DIAGNOSIS — R200 Anesthesia of skin: Secondary | ICD-10-CM | POA: Diagnosis not present

## 2019-07-25 ENCOUNTER — Telehealth: Payer: Self-pay | Admitting: *Deleted

## 2019-07-25 ENCOUNTER — Ambulatory Visit: Payer: BC Managed Care – PPO | Admitting: Neurology

## 2019-07-25 NOTE — Telephone Encounter (Signed)
Called and spoke with pt. Advised d/t inclement weather we have to r/s her appt today. Rescheduled with her to 08/01/19 at 1:30pm with Dr. Felecia Shelling. Asked that she check in by 1:00pm. She verbalized understanding.

## 2019-08-01 ENCOUNTER — Ambulatory Visit: Payer: BC Managed Care – PPO | Admitting: Neurology

## 2019-08-01 ENCOUNTER — Other Ambulatory Visit: Payer: Self-pay

## 2019-08-01 ENCOUNTER — Encounter: Payer: Self-pay | Admitting: Neurology

## 2019-08-01 VITALS — BP 131/80 | HR 73 | Temp 97.2°F | Ht 65.0 in | Wt 142.5 lb

## 2019-08-01 DIAGNOSIS — M542 Cervicalgia: Secondary | ICD-10-CM

## 2019-08-01 DIAGNOSIS — R200 Anesthesia of skin: Secondary | ICD-10-CM | POA: Diagnosis not present

## 2019-08-01 DIAGNOSIS — G373 Acute transverse myelitis in demyelinating disease of central nervous system: Secondary | ICD-10-CM | POA: Diagnosis not present

## 2019-08-01 DIAGNOSIS — R269 Unspecified abnormalities of gait and mobility: Secondary | ICD-10-CM | POA: Insufficient documentation

## 2019-08-01 NOTE — Progress Notes (Signed)
[facility]  PATIENT: Elizabeth Long DOB: [birth year]  REFERRING DOCTOR OR PCP: Ria Bush MD SOURCE: Patient, notes from primary care, imaging and lab reports, MRI images of the cervical and lumbar spine personally reviewed  _________________________________   HISTORICAL  CHIEF COMPLAINT:  Chief Complaint  Patient presents with  . New Patient (Initial Visit)    RM 13, alone. Internal referral from Ria Bush, MD for demylinating disease. Denies any vision problems, wears glasses.  Having balance issues, has had near falls. Having numbness on right down down to her feet for hte last 3 weeks. Had MRI lumbar 07/18/19--in epic.     HISTORY OF PRESENT ILLNESS:  I had the pleasure of seeing patient, Elizabeth Long, at [facility] for neurologic consultation regarding her abnormal spinal cord imaging, numbness and gait issues.   She is a 59 year old woman with numbness in both legs who was noted on MRI to have a T2 hyperintense focus adjacent to L1 in the spinal cord.    In 2006, she had cervical and lumbar surgery due to degenerative changes.  She had cervical fusion at [facility].  In 2011, she had right arm weakness and had an MRI showing an abnormal cervical spine focus at [facility] and mild cervical degenerative changes.   She also had severe shoulder degenerative changes and needed to have surgery.   Her arm improved but still had some numbness.  She has had difficulties with reduced balance x 6 months.  She had a fall while shopping.   She feels there is some weakness in her right leg but she has tingling in both legs.  She notes some incomplete bladder emptying.     Currently, gait is fine but balance is mildly off.   She denies weakness.   She has numbness in both legs.   She has incomplete bladder emptying.   She denies any visual disturbances.   She has fatigue.   She has insomnia and takes gabapentin (300 mg qHS) and lorazepam 2 mg and sometimes  Benadryl.  She notes depression.   She has more apathy recently.    She is working doing Medical sales representative work.   She feels more forgetful.    I personally reviewed the MRI of the cervical spine 03/18/2010 and the MRI of the lumbar spine 07/18/2019.  The cervical spine shows a left lateral focus at C2-C3 that could be consistent with a demyelinating plaque.  The lumbar spine shows 2 foci, one adjacent to T12 to the right and 1 within the conus adjacent to L1 to the anterolaterally to the left.   At [facility] disc protrusion could affect left L3 nerve root and at L4L5 protrusion could affect left L5 nerve root.    REVIEW OF SYSTEMS: Constitutional: No fevers, chills, sweats, or change in appetite.   Insomnia and fatigue Eyes: No visual changes, double vision, eye pain Ear, nose and throat: No hearing loss, ear pain, nasal congestion, sore throat Cardiovascular: No chest pain, palpitations Respiratory: No shortness of breath at rest or with exertion.   No wheezes GastrointestinaI: No nausea, vomiting, diarrhea, abdominal pain, fecal incontinence Genitourinary: No dysuria, urinary retention or frequency.  No nocturia. Musculoskeletal: Notes neck pain, back pain Integumentary: No rash, pruritus, skin lesions Neurological: as above Psychiatric: Notes depression and anxiety Endocrine: No palpitations, diaphoresis, change in appetite, change in weigh or increased thirst Hematologic/Lymphatic: No anemia, purpura, petechiae. Allergic/Immunologic: No itchy/runny eyes, nasal congestion, recent allergic reactions, rashes  ALLERGIES: Allergies  Allergen  Reactions  . Cefotan [Cefotetan Disodium]     swelling  . Sulfur     Rash all over    HOME MEDICATIONS:  Current Outpatient Medications:  .  cetirizine (ZYRTEC) 10 MG tablet, Take 10 mg by mouth daily as needed for allergies., Disp: , Rfl:  .  escitalopram (LEXAPRO) 5 MG tablet, Take 1 tablet (5 mg total) by mouth daily., Disp: 90 tablet, Rfl: 3 .   fluticasone (FLONASE) 50 MCG/ACT nasal spray, Place 2 sprays into both nostrils daily., Disp: 16 g, Rfl: 1 .  gabapentin (NEURONTIN) 100 MG capsule, TAKE 1 CAPSULE BY MOUTH THREE TIMES A DAY AS NEEDED, Disp: 270 capsule, Rfl: 0 .  hydrochlorothiazide (MICROZIDE) 12.5 MG capsule, TAKE 1 CAPSULE BY MOUTH EVERY DAY, Disp: 90 capsule, Rfl: 2 .  Lifitegrast (XIIDRA) 5 % SOLN, Xiidra 5 % eye drops in a dropperette  INSTILL 1 DROP INTO EACH EYE TWICE DAILY, Disp: , Rfl:  .  LORazepam (ATIVAN) 2 MG tablet, Take 0.5 tablets (1 mg total) by mouth at bedtime., Disp: , Rfl: 0 .  meloxicam (MOBIC) 15 MG tablet, Take 1 tablet (15 mg total) by mouth daily., Disp: 90 tablet, Rfl: 1 .  Multiple Vitamins-Minerals (CENTRUM SILVER 50+WOMEN) TABS, Take 1 tablet by mouth daily., Disp: , Rfl:  .  omeprazole (PRILOSEC) 20 MG capsule, TAKE 1 CAPSULE BY MOUTH EVERY DAY, Disp: 90 capsule, Rfl: 2 .  traMADol (ULTRAM) 50 MG tablet, Take 50 mg by mouth at bedtime as needed for moderate pain. , Disp: , Rfl:   PAST MEDICAL HISTORY: Past Medical History:  Diagnosis Date  . Allergic rhinitis   . Allergy   . Anemia    as a teenager  . Anxiety    history of panic attack  . Anxiety associated with depression   . Chronic headaches    migraines- not any longer  . Depression   . Essential hypertension 10/24/2015  . Family history of adverse reaction to anesthesia    mother - N/V  . GERD (gastroesophageal reflux disease)   . History of alcoholism ([place])    Sober since 76 Strong fmhx alcoholism  . History of pneumonia    x2  . Hyperlipidemia   . IBS (irritable bowel syndrome)   . Irritable bowel syndrome (IBS)    constipation  . Microscopic colitis   . Osteoarthritis 2000s   chronic back and shoulder pain s/p surgery  . Pneumonia    had x 2 last time age 59  . Post menopausal syndrome 10/24/2015  . Substance abuse ([place])    alcohol abuse  . Tubular adenoma of colon     PAST SURGICAL HISTORY: Past Surgical History:   Procedure Laterality Date  . ABDOMINAL HYSTERECTOMY  1990   endometriosis  . ANTERIOR CERVICAL DECOMP/DISCECTOMY FUSION  2006   C5/6 HNP ([place])  . APPENDECTOMY    . CHOLECYSTECTOMY N/A 10/19/2016   Procedure: LAPAROSCOPIC CHOLECYSTECTOMY;  Surgeon: Stark Klein, MD;  Location: [place];  Service: General;  Laterality: N/A;  . COLONOSCOPY  06/2011   TA, microscopic colitis, rpt 5 yrs ([place])  . COLONOSCOPY  09/2016   3mm polyp - tubular adenoma rpt 3 yrs ([place])  . cyst on ovary    . EYE SURGERY Bilateral    Lasik  . LUMBAR DISC SURGERY  2002   [place]  . lumbar esi  2010  . NOSE SURGERY    . POLYPECTOMY    . SHOULDER SURGERY Right  Rotatar Cuff  . TONSILLECTOMY      FAMILY HISTORY: Family History  Problem Relation Age of Onset  . Arthritis Mother   . CAD Mother 45       4v bypass  . Arthritis Father   . Hypertension Father   . Alcohol abuse Brother   . Heart disease Maternal Grandmother        CHF  . Cancer Maternal Grandfather        lung (smoker)  . Alcohol abuse Paternal Grandmother   . Hyperlipidemia Paternal Grandmother   . CAD Paternal Grandmother        MI x3  . Stroke Paternal Grandmother   . Hypertension Paternal Grandmother   . Alcohol abuse Paternal Grandfather   . Arthritis Paternal Grandfather   . Heart disease Paternal Grandfather   . Stroke Paternal Grandfather   . Diabetes Paternal Grandfather   . Cancer Maternal Aunt        throat  . CAD Paternal Uncle        MI  . Stroke Paternal Uncle   . Colon cancer Neg Hx   . Esophageal cancer Neg Hx   . Stomach cancer Neg Hx   . Rectal cancer Neg Hx     SOCIAL HISTORY:  Social History   Socioeconomic History  . Marital status: Single    Spouse name: Not on file  . Number of children: 0  . Years of education: Not on file  . Highest education level: Not on file  Occupational History  . Occupation: Scientist, clinical (histocompatibility and immunogenetics) CLASS 1    Employer: [employer]  Tobacco Use  . Smoking status: Former  Smoker    Years: 30.00    Quit date: 04/11/2000    Years since quitting: 19.3  . Smokeless tobacco: Never Used  Substance and Sexual Activity  . Alcohol use: No    Alcohol/week: 0.0 standard drinks    Comment: hx  . Drug use: No  . Sexual activity: Yes  Other Topics Concern  . Not on file  Social History Narrative   Daily caffeine- coffee (3-4 cups per day)   Right handed    Lives alone   Occ: Air cabin crew at [employer]   Edu: HS   Activity: no regular exercise   Diet: good water, poor fruits/vegetables, sweets   Social Determinants of Health   Financial Resource Strain:   . Difficulty of Paying Living Expenses: Not on file  Food Insecurity:   . Worried About Charity fundraiser in the Last Year: Not on file  . Ran Out of Food in the Last Year: Not on file  Transportation Needs:   . Lack of Transportation (Medical): Not on file  . Lack of Transportation (Non-Medical): Not on file  Physical Activity:   . Days of Exercise per Week: Not on file  . Minutes of Exercise per Session: Not on file  Stress:   . Feeling of Stress : Not on file  Social Connections:   . Frequency of Communication with Friends and Family: Not on file  . Frequency of Social Gatherings with Friends and Family: Not on file  . Attends Religious Services: Not on file  . Active Member of Clubs or Organizations: Not on file  . Attends Archivist Meetings: Not on file  . Marital Status: Not on file  Intimate Partner Violence:   . Fear of Current or Ex-Partner: Not on file  . Emotionally Abused: Not on file  .  Physically Abused: Not on file  . Sexually Abused: Not on file     PHYSICAL EXAM  Vitals:   08/01/19 1318  BP: 131/80  Pulse: 73  Temp: (!) 97.2 F (36.2 C)  Weight: 142 lb 8 oz (64.6 kg)  Height: 5\' 5"  (1.651 m)    Body mass index is 23.71 kg/m.   General: The patient is well-developed and well-nourished and in no acute distress  HEENT:  Head is [place]/AT.   Sclera are anicteric.  Funduscopic exam shows normal optic discs and retinal vessels.  Neck: No carotid bruits are noted.  The neck is nontender.  Cardiovascular: The heart has a regular rate and rhythm with a normal S1 and S2. There were no murmurs, gallops or rubs.    Skin: Extremities are without rash or  edema.  Musculoskeletal:  Back is nontender  Neurologic Exam  Mental status: The patient is alert and oriented x 3 at the time of the examination. The patient has apparent normal recent and remote memory, with an apparently normal attention span and concentration ability.   Speech is normal.  Cranial nerves: Extraocular movements are full. Pupils are equal, round, and reactive to light and accomodation.  Symmetric color vision.  Facial symmetry is present. There is good facial sensation to soft touch bilaterally.Facial strength is normal.  Trapezius and sternocleidomastoid strength is normal. No dysarthria is noted.  The tongue is midline, and the patient has symmetric elevation of the soft palate. No obvious hearing deficits are noted.  Motor:  Muscle bulk is normal.   Tone is normal. Strength is  5 / 5 in all 4 extremities.   Sensory: Sensory testing is intact to pinprick, soft touch and vibration sensation in all 4 extremities.  Coordination: Cerebellar testing reveals good finger-nose-finger and mildly reduced heel-to-shin bilaterally.  Gait and station: Station is normal.   Gait is mildly wide and tandem is wide. Romberg is negative.   Reflexes: Deep tendon reflexes are symmetric and normal in arms but increased at knees, left > right.   Plantar responses are flexor.    DIAGNOSTIC DATA (LABS, IMAGING, TESTING) - I reviewed patient records, labs, notes, testing and imaging myself where available.  Lab Results  Component Value Date   WBC 7.1 03/07/2018   HGB 13.4 03/07/2018   HCT 39.5 03/07/2018   MCV 90.3 03/07/2018   PLT 390.0 03/07/2018      Component Value Date/Time    NA 139 03/13/2019 0749   NA 139 10/24/2012 0000   K 4.1 03/13/2019 0749   K 4.1 10/24/2012 0000   CL 100 03/13/2019 0749   CO2 31 03/13/2019 0749   GLUCOSE 96 03/13/2019 0749   BUN 13 03/13/2019 0749   CREATININE 0.80 03/13/2019 0749   CREATININE 0.82 10/24/2012 0000   CALCIUM 10.2 03/13/2019 0749   CALCIUM 10.5 10/24/2012 0000   PROT 7.5 03/07/2018 0744   ALBUMIN 4.2 03/07/2018 0744   AST 41 (H) 03/07/2018 0744   ALT 41 (H) 03/07/2018 0744   ALKPHOS 62 03/07/2018 0744   BILITOT 0.4 03/07/2018 0744   GFRNONAA >60 10/19/2016 0955   GFRAA >60 10/19/2016 0955   Lab Results  Component Value Date   CHOL 207 (H) 03/13/2019   HDL 66.00 03/13/2019   LDLCALC 124 (H) 03/13/2019   TRIG 84.0 03/13/2019   CHOLHDL 3 03/13/2019   Lab Results  Component Value Date   HGBA1C 5.7 03/07/2018   Lab Results  Component Value Date   VITAMINB12  613 03/07/2018   Lab Results  Component Value Date   TSH 1.63 03/07/2018       ASSESSMENT AND PLAN  Transverse myelitis ([place]) - Plan: MR BRAIN W WO CONTRAST, MR CERVICAL SPINE W WO CONTRAST  Gait disturbance - Plan: MR BRAIN W WO CONTRAST, MR CERVICAL SPINE W WO CONTRAST  Leg numbness - Plan: MR BRAIN W WO CONTRAST, MR CERVICAL SPINE W WO CONTRAST  Neck pain  In summary, Elizabeth Long is a 59 year old woman who had an episode of right arm numbness and weakness in 2006 and more recent leg numbness and gait disturbance.  On examination, she does have gait disturbance and increased reflexes in the legs.  MRIs have shown T2 hyperintense foci to the left at C2-C3, to the left at L1 and possibly to the right at T12.  The foci are potentially worrisome for a demyelinating disorder such as multiple sclerosis.  To further evaluate this we will check an MRI of the brain and cervical spine.  If there arelesions consistent with MS, we would need to consider initiating a disease modifying therapy.  We will let her know the results of the MRI and she will  return to see me in a couple months, sooner if the MRIs are consistent with MS.  She should call us earlier if she has new or worsening neurologic symptoms.  Thank you for asking me to see Elizabeth Long.  Please let me know if I can be of further assistance with her other patients in the future.  Clarity Ciszek A. Felecia Shelling, MD, [facility] [id number], [id number] PM Certified in Neurology, Clinical Neurophysiology, Sleep Medicine and Neuroimaging  [facility] [address], [place], [place] [address] [phone]

## 2019-08-06 ENCOUNTER — Encounter: Payer: Self-pay | Admitting: Gastroenterology

## 2019-08-08 ENCOUNTER — Telehealth: Payer: Self-pay | Admitting: Neurology

## 2019-08-08 NOTE — Telephone Encounter (Signed)
[employer]BS pending faxed notes.  

## 2019-08-12 NOTE — Telephone Encounter (Signed)
no to the covid questions MR Brain w/wo contrast & MR Cervical spine w/wo contrast Dr. Cheree Ditto Auth: [id number] [id number] & [phone] (Exp. 08/08/19 to 02/04/20). Patient is scheduled at [facility] for 08/13/19.

## 2019-08-13 ENCOUNTER — Ambulatory Visit: Payer: BC Managed Care – PPO

## 2019-08-13 ENCOUNTER — Other Ambulatory Visit: Payer: Self-pay

## 2019-08-13 DIAGNOSIS — R269 Unspecified abnormalities of gait and mobility: Secondary | ICD-10-CM | POA: Diagnosis not present

## 2019-08-13 DIAGNOSIS — R200 Anesthesia of skin: Secondary | ICD-10-CM | POA: Diagnosis not present

## 2019-08-13 DIAGNOSIS — G373 Acute transverse myelitis in demyelinating disease of central nervous system: Secondary | ICD-10-CM | POA: Diagnosis not present

## 2019-08-13 MED ORDER — GADOBENATE DIMEGLUMINE 529 MG/ML IV SOLN
15.0000 mL | Freq: Once | INTRAVENOUS | Status: AC | PRN
  Administered 2019-08-13: 15 mL via INTRAVENOUS

## 2019-08-16 ENCOUNTER — Telehealth: Payer: Self-pay | Admitting: Neurology

## 2019-08-16 DIAGNOSIS — G373 Acute transverse myelitis in demyelinating disease of central nervous system: Secondary | ICD-10-CM

## 2019-08-16 DIAGNOSIS — R9082 White matter disease, unspecified: Secondary | ICD-10-CM

## 2019-08-16 NOTE — Telephone Encounter (Signed)
I spoke with Elizabeth Long to go over the results of the MRI.  The MRI of the cervical spine shows the one spot adjacent to C2.  There is also a focus in the pons and a few foci in the hemispheres, predominantly in the juxtacortical white matter.  No definite periventricular foci were noted.  I discussed with her that there is at least a 50% chance that she has MS but not a 90% or higher chance and we need to get additional information.  Therefore, I will check a lumbar puncture to see if she has oligoclonal bands in the spinal fluid.  If these are present, then we can be more certain of the diagnosis and I would start a disease modifying therapy.  If the spinal fluid is normal I would likely want to check another MRI in about 9 to 12 months to determine if there has been subclinical progression.

## 2019-08-21 ENCOUNTER — Ambulatory Visit
Admission: RE | Admit: 2019-08-21 | Discharge: 2019-08-21 | Disposition: A | Discharge status: Home / Self Care | Payer: BC Managed Care – PPO | Source: Ambulatory Visit | Attending: Neurology | Admitting: Neurology

## 2019-08-21 ENCOUNTER — Other Ambulatory Visit: Payer: Self-pay

## 2019-08-21 VITALS — BP 120/43 | HR 64

## 2019-08-21 DIAGNOSIS — G373 Acute transverse myelitis in demyelinating disease of central nervous system: Secondary | ICD-10-CM | POA: Diagnosis not present

## 2019-08-21 DIAGNOSIS — R9082 White matter disease, unspecified: Secondary | ICD-10-CM

## 2019-08-21 DIAGNOSIS — G939 Disorder of brain, unspecified: Secondary | ICD-10-CM | POA: Diagnosis not present

## 2019-08-21 NOTE — Progress Notes (Signed)
Blood drawn from pts LAC for LP bloodwork. 1 attempt, pt tolerated well. Site clean and dry with band aid afterwards.

## 2019-08-21 NOTE — Discharge Instructions (Signed)

## 2019-08-26 ENCOUNTER — Ambulatory Visit
Admission: RE | Admit: 2019-08-26 | Discharge: 2019-08-26 | Disposition: A | Discharge status: Home / Self Care | Payer: BC Managed Care – PPO | Source: Ambulatory Visit | Attending: Neurology | Admitting: Neurology

## 2019-08-26 ENCOUNTER — Telehealth: Payer: Self-pay | Admitting: Neurology

## 2019-08-26 DIAGNOSIS — G971 Other reaction to spinal and lumbar puncture: Secondary | ICD-10-CM | POA: Diagnosis not present

## 2019-08-26 MED ORDER — IOPAMIDOL (ISOVUE-M 200) INJECTION 41%: 1.0000 mL | Freq: Once | INTRAMUSCULAR | Status: DC

## 2019-08-26 NOTE — Telephone Encounter (Signed)
Pt called stating that she had a spinal tap Wednesday and ever since that day she has had a horrible headache and is barley able to work today. Please advise.

## 2019-08-26 NOTE — Discharge Instructions (Signed)

## 2019-08-26 NOTE — Telephone Encounter (Addendum)
Called and spoke with pt. She has tried taking in increased caffeine and has been resting in bed but she is still having terrible HA. Per Dr. Felecia Shelling, we will order blood patch. [facility] will be called from [facility] to get scheduled. She verbalized understanding.

## 2019-08-27 NOTE — Telephone Encounter (Signed)
Pt called stating that the blood patch is not working for her and she is needing to be advised by RN.

## 2019-08-27 NOTE — Telephone Encounter (Signed)
If still having headaches, we can start imipramine 25 mg daily at night

## 2019-08-28 ENCOUNTER — Telehealth: Payer: Self-pay | Admitting: *Deleted

## 2019-08-28 LAB — VDRL, CSF: VDRL Quant, CSF: NONREACTIVE

## 2019-08-28 LAB — CSF CELL COUNT WITH DIFFERENTIAL
RBC Count, CSF: 67 cells/uL — ABNORMAL HIGH
WBC, CSF: 2 cells/uL (ref 0–5)

## 2019-08-28 LAB — CNS IGG SYNTHESIS RATE, CSF+BLOOD
Albumin Serum: 4.1 g/dL (ref 3.5–5.2)
Albumin, CSF: 17.7 mg/dL (ref 8.0–42.0)
CNS-IgG Synthesis Rate: -1.9 mg/24 h (ref <=3.3)
IgG (Immunoglobin G), Serum: 921 mg/dL (ref 600–1640)
IgG Total CSF: 2.1 mg/dL (ref 0.8–7.7)
IgG-Index: 0.53 (ref <0.66)

## 2019-08-28 LAB — PROTEIN, CSF: Total Protein, CSF: 38 mg/dL (ref 15–45)

## 2019-08-28 LAB — GLUCOSE, CSF: Glucose, CSF: 54 mg/dL (ref 40–80)

## 2019-08-28 LAB — OLIGOCLONAL BANDS, CSF + SERM

## 2019-08-28 MED ORDER — IMIPRAMINE HCL 25 MG PO TABS: 25.0000 mg | ORAL_TABLET | Freq: Every day | ORAL | 3 refills | Status: DC

## 2019-08-28 NOTE — Telephone Encounter (Signed)
Called pt back. Relayed Dr. Garth Bigness recommendation. She is agreeable to try this. She is still in bed, has throbbing pain in head. It has improved some but still present. She will call back if sx do not improve.

## 2019-08-28 NOTE — Telephone Encounter (Signed)
-----   Message from Britt Bottom, MD sent at 08/28/2019 12:43 PM EDT ----- Please let her know that the lumbar puncture spinal fluid came back.  She does not have the abnormality seen in the majority of MS patients.  Although that does not rule out MS, it makes it less likely.  I will still want to check another MRI in about a year from the last 1 to determine if it is stable

## 2019-08-28 NOTE — Addendum Note (Signed)
Addended by: Wyvonnia Lora on: 08/28/2019 09:38 AM   Modules accepted: Orders

## 2019-09-03 ENCOUNTER — Encounter: Payer: Self-pay | Admitting: Neurology

## 2019-09-05 DIAGNOSIS — D3132 Benign neoplasm of left choroid: Secondary | ICD-10-CM | POA: Diagnosis not present

## 2019-09-16 DIAGNOSIS — I10 Essential (primary) hypertension: Secondary | ICD-10-CM | POA: Diagnosis not present

## 2019-09-16 DIAGNOSIS — M4722 Other spondylosis with radiculopathy, cervical region: Secondary | ICD-10-CM | POA: Diagnosis not present

## 2019-09-19 ENCOUNTER — Other Ambulatory Visit: Payer: Self-pay | Admitting: Neurology

## 2019-09-19 ENCOUNTER — Other Ambulatory Visit: Payer: Self-pay | Admitting: Neurosurgery

## 2019-09-19 DIAGNOSIS — M4722 Other spondylosis with radiculopathy, cervical region: Secondary | ICD-10-CM

## 2019-10-02 ENCOUNTER — Ambulatory Visit
Admission: RE | Admit: 2019-10-02 | Discharge: 2019-10-02 | Disposition: A | Discharge status: Home / Self Care | Payer: BC Managed Care – PPO | Source: Ambulatory Visit | Attending: Neurosurgery | Admitting: Neurosurgery

## 2019-10-02 ENCOUNTER — Other Ambulatory Visit: Payer: Self-pay

## 2019-10-02 DIAGNOSIS — M4722 Other spondylosis with radiculopathy, cervical region: Secondary | ICD-10-CM | POA: Diagnosis not present

## 2019-10-02 DIAGNOSIS — M542 Cervicalgia: Secondary | ICD-10-CM | POA: Diagnosis not present

## 2019-10-05 HISTORY — PX: COLONOSCOPY: SHX174

## 2019-10-06 ENCOUNTER — Other Ambulatory Visit: Payer: Self-pay | Admitting: Family Medicine

## 2019-10-07 NOTE — Telephone Encounter (Signed)
Last filled 07-15-19 #270 Last OV 07-17-19 No Future OV [facility]

## 2019-10-07 NOTE — Telephone Encounter (Signed)
Last filled 07-10-17 Last OV 07-17-19 No Future OV [facility]

## 2019-10-08 ENCOUNTER — Encounter: Payer: Self-pay | Admitting: Family Medicine

## 2019-10-09 MED ORDER — GABAPENTIN 100 MG PO CAPS: 100.0000 mg | ORAL_CAPSULE | Freq: Three times a day (TID) | ORAL | 1 refills | Status: DC | PRN

## 2019-10-09 NOTE — Telephone Encounter (Signed)
Spoke with pt relaying Dr. Synthia Innocent message.  Pt verbalizes understanding.  Says she put in refill request via MyChart and was not sure how it would work.  I advised always best to contact the pharmacy for refills.  Verbalizes understanding and will contact Dr. Gwynne Edinger office.  FYI to Dr. Darnell Level.

## 2019-10-09 NOTE — Telephone Encounter (Signed)
Gabapentin refilled.  lorazepam previously prescribed by Dr Stann Mainland GYN. plz call patient - this request should go back to GYN? I have pended lorazepam for now.

## 2019-10-14 DIAGNOSIS — M4722 Other spondylosis with radiculopathy, cervical region: Secondary | ICD-10-CM | POA: Diagnosis not present

## 2019-10-16 ENCOUNTER — Ambulatory Visit (AMBULATORY_SURGERY_CENTER): Payer: Self-pay | Admitting: *Deleted

## 2019-10-16 ENCOUNTER — Other Ambulatory Visit: Payer: Self-pay

## 2019-10-16 VITALS — Temp 97.1°F | Ht 65.0 in | Wt 146.0 lb

## 2019-10-16 DIAGNOSIS — Z86010 Personal history of colonic polyps: Secondary | ICD-10-CM

## 2019-10-16 DIAGNOSIS — Z01818 Encounter for other preprocedural examination: Secondary | ICD-10-CM

## 2019-10-16 MED ORDER — SUTAB 1479-225-188 MG PO TABS: 24.0000 | ORAL_TABLET | ORAL | 0 refills | Status: DC

## 2019-10-16 NOTE — Progress Notes (Signed)
No egg or soy allergy known to patient  No issues with past sedation with any surgeries  or procedures, no intubation problems  No diet pills per patient No home 02 use per patient  No blood thinners per patient  Pt denies issues with constipation  No A fib or A flutter  EMMI video sent to pt's e mail    Pt states she is having diarrhea, having to take OTC imodium to leave her house- ? Colitis vs having GB removed- pt instructed no imodium 2 days before starting 5-24 Monday    Due to the COVID-19 pandemic we are asking patients to follow these guidelines. Please only bring one care partner. Please be aware that your care partner may wait in the car in the parking lot or if they feel like they will be too hot to wait in the car, they may wait in the lobby on the 4th floor. All care partners are required to wear a mask the entire time (we do not have any that we can provide them), they need to practice social distancing, and we will do a Covid check for all patient's and care partners when you arrive. Also we will check their temperature and your temperature. If the care partner waits in their car they need to stay in the parking lot the entire time and we will call them on their cell phone when the patient is ready for discharge so they can bring the car to the front of the building. Also all patient's will need to wear a mask into building.  sutab code and coupon

## 2019-10-28 ENCOUNTER — Other Ambulatory Visit: Payer: Self-pay

## 2019-10-28 ENCOUNTER — Other Ambulatory Visit
Admission: RE | Admit: 2019-10-28 | Discharge: 2019-10-28 | Disposition: A | Discharge status: Home / Self Care | Payer: BC Managed Care – PPO | Source: Ambulatory Visit | Attending: Gastroenterology | Admitting: Gastroenterology

## 2019-10-28 DIAGNOSIS — Z20822 Contact with and (suspected) exposure to covid-19: Secondary | ICD-10-CM | POA: Diagnosis not present

## 2019-10-28 DIAGNOSIS — Z01812 Encounter for preprocedural laboratory examination: Secondary | ICD-10-CM | POA: Insufficient documentation

## 2019-10-28 LAB — SARS CORONAVIRUS 2 (TAT 6-24 HRS): SARS Coronavirus 2: NEGATIVE

## 2019-10-30 ENCOUNTER — Ambulatory Visit (AMBULATORY_SURGERY_CENTER): Payer: BC Managed Care – PPO | Admitting: Gastroenterology

## 2019-10-30 ENCOUNTER — Other Ambulatory Visit: Payer: Self-pay

## 2019-10-30 ENCOUNTER — Encounter: Payer: Self-pay | Admitting: Gastroenterology

## 2019-10-30 VITALS — BP 129/72 | HR 57 | Temp 96.9°F | Resp 18 | Ht 65.0 in | Wt 146.0 lb

## 2019-10-30 DIAGNOSIS — K635 Polyp of colon: Secondary | ICD-10-CM | POA: Diagnosis not present

## 2019-10-30 DIAGNOSIS — R197 Diarrhea, unspecified: Secondary | ICD-10-CM

## 2019-10-30 DIAGNOSIS — Z86010 Personal history of colonic polyps: Secondary | ICD-10-CM

## 2019-10-30 DIAGNOSIS — D124 Benign neoplasm of descending colon: Secondary | ICD-10-CM

## 2019-10-30 DIAGNOSIS — K52831 Collagenous colitis: Secondary | ICD-10-CM

## 2019-10-30 DIAGNOSIS — K529 Noninfective gastroenteritis and colitis, unspecified: Secondary | ICD-10-CM | POA: Diagnosis not present

## 2019-10-30 MED ORDER — COLESTIPOL HCL 1 G PO TABS: 1.0000 g | ORAL_TABLET | Freq: Two times a day (BID) | ORAL | 3 refills | Status: DC

## 2019-10-30 MED ORDER — SODIUM CHLORIDE 0.9 % IV SOLN: 500.0000 mL | Freq: Once | INTRAVENOUS | Status: DC

## 2019-10-30 NOTE — Patient Instructions (Signed)
YOU HAD AN ENDOSCOPIC PROCEDURE TODAY AT [facility]:   Refer to the procedure report that was given to you for any specific questions about what was found during the examination.  If the procedure report does not answer your questions, please call your gastroenterologist to clarify.  If you requested that your care partner not be given the details of your procedure findings, then the procedure report has been included in a sealed envelope for you to review at your convenience later.  YOU SHOULD EXPECT: Some feelings of bloating in the abdomen. Passage of more gas than usual.  Walking can help get rid of the air that was put into your GI tract during the procedure and reduce the bloating. If you had a lower endoscopy (such as a colonoscopy or flexible sigmoidoscopy) you may notice spotting of blood in your stool or on the toilet paper. If you underwent a bowel prep for your procedure, you may not have a normal bowel movement for a few days.  Please Note:  You might notice some irritation and congestion in your nose or some drainage.  This is from the oxygen used during your procedure.  There is no need for concern and it should clear up in a day or so.  SYMPTOMS TO REPORT IMMEDIATELY:   Following lower endoscopy (colonoscopy or flexible sigmoidoscopy):  Excessive amounts of blood in the stool  Significant tenderness or worsening of abdominal pains  Swelling of the abdomen that is new, acute  Fever of 100F or higher   For urgent or emergent issues, a gastroenterologist can be reached at any hour by calling [phone]. Do not use MyChart messaging for urgent concerns.    DIET:  We do recommend a small meal at first, but then you may proceed to your regular diet.  Drink plenty of fluids but you should avoid alcoholic beverages for 24 hours.  MEDICATIONS: Continue present medications. Use Colestid 1 gram twice daily with meals (#60 with 3 refills sent to your pharmacy), avoid  taking it within 3 hours of taking other medications to avoid drug interaction.  FOLLOW UP: Return to the see Dr. Silverio Decamp in her office in 2-3 months, please call [phone] to schedule this appointment.  Please see handouts given to you by your recovery nurse.  ACTIVITY:  You should plan to take it easy for the rest of today and you should NOT DRIVE or use heavy machinery until tomorrow (because of the sedation medicines used during the test).    FOLLOW UP: Our staff will call the number listed on your records 48-72 hours following your procedure to check on you and address any questions or concerns that you may have regarding the information given to you following your procedure. If we do not reach you, we will leave a message.  We will attempt to reach you two times.  During this call, we will ask if you have developed any symptoms of COVID 19. If you develop any symptoms (ie: fever, flu-like symptoms, shortness of breath, cough etc.) before then, please call [phone].  If you test positive for Covid 19 in the 2 weeks post procedure, please call and report this information to [place].    If any biopsies were taken you will be contacted by phone or by letter within the next 1-3 weeks.  Please call us at [phone] if you have not heard about the biopsies in 3 weeks.   Thank you for allowing [place] to provide for  your healthcare needs today.   SIGNATURES/CONFIDENTIALITY: You and/or your care partner have signed paperwork which will be entered into your electronic medical record.  These signatures attest to the fact that that the information above on your After Visit Summary has been reviewed and is understood.  Full responsibility of the confidentiality of this discharge information lies with you and/or your care-partner.

## 2019-10-30 NOTE — Progress Notes (Signed)
To PACU, VSS. Report to [email or web address] 

## 2019-10-30 NOTE — Progress Notes (Signed)
Temp JB VS DT  Pt's states no medical or surgical changes since previsit or office visit. 

## 2019-10-30 NOTE — Op Note (Addendum)
[place] Patient Name: Elizabeth Long Procedure Date: 10/30/2019 8:15 AM MRN: [id number] Endoscopist: Mauri Pole , MD Age: 59 Referring MD:  Date of Birth: Oct 25, 1960 Gender: Female Account #: [id number] Procedure:                Colonoscopy Indications:              High risk colon cancer surveillance: Personal                            history of colonic polyps, High risk colon cancer                            surveillance: Personal history of adenoma (10 mm or                            greater in size), h/o collagenous colitis.                            Incidental diarrhea noted Medicines:                Monitored Anesthesia Care Procedure:                Pre-Anesthesia Assessment:                           - Prior to the procedure, a History and Physical                            was performed, and patient medications and                            allergies were reviewed. The patient's tolerance of                            previous anesthesia was also reviewed. The risks                            and benefits of the procedure and the sedation                            options and risks were discussed with the patient.                            All questions were answered, and informed consent                            was obtained. Prior Anticoagulants: The patient has                            taken no previous anticoagulant or antiplatelet                            agents. ASA Grade Assessment: II - A patient with  mild systemic disease. After reviewing the risks                            and benefits, the patient was deemed in                            satisfactory condition to undergo the procedure.                           After obtaining informed consent, the colonoscope                            was passed under direct vision. Throughout the                            procedure, the patient's blood pressure,  pulse, and                            oxygen saturations were monitored continuously. The                            Colonoscope was introduced through the anus and                            advanced to the the cecum, identified by                            appendiceal orifice and ileocecal valve. The                            colonoscopy was performed without difficulty. The                            patient tolerated the procedure well. The quality                            of the bowel preparation was excellent. The                            ileocecal valve, appendiceal orifice, and rectum                            were photographed. Scope In: 8:20:10 AM Scope Out: 8:34:41 AM Scope Withdrawal Time: 0 hours 10 minutes 33 seconds  Total Procedure Duration: 0 hours 14 minutes 31 seconds  Findings:                 The perianal and digital rectal examinations were                            normal.                           A 11 mm polyp was found in the descending colon.  The polyp was flat. The polyp was removed with a                            cold snare. Resection and retrieval were complete.                           Normal mucosa was found in the entire colon.                            Biopsies for histology were taken with a cold                            forceps from the right colon and left colon for                            evaluation of microscopic colitis.                           Non-bleeding internal hemorrhoids were found during                            retroflexion. The hemorrhoids were medium-sized.                           The exam was otherwise without abnormality. Complications:            No immediate complications. Estimated Blood Loss:     Estimated blood loss was minimal. Impression:               - One 11 mm polyp in the descending colon, removed                            with a cold snare. Resected and retrieved.                            - Normal mucosa in the entire examined colon.                            Biopsied.                           - Non-bleeding internal hemorrhoids.                           - The examination was otherwise normal. Recommendation:           - Patient has a contact number available for                            emergencies. The signs and symptoms of potential                            delayed complications were discussed with the  patient. Return to normal activities tomorrow.                            Written discharge instructions were provided to the                            patient.                           - Resume previous diet.                           - Continue present medications.                           - Await pathology results.                           - Repeat colonoscopy in 3 - 5 years for                            surveillance based on pathology results.                           - Colestid 1gm twice daily with meals, avoid taking                            it within 3 hours of taking other meds to avoid                            drug interaction.                           - Return to GI clinic in 2-3 months, please call                            [phone] to schedule an appointment. Mauri Pole, MD 10/30/2019 8:43:33 AM This report has been signed electronically.

## 2019-11-01 ENCOUNTER — Telehealth: Payer: Self-pay | Admitting: *Deleted

## 2019-11-01 ENCOUNTER — Other Ambulatory Visit: Payer: Self-pay

## 2019-11-01 ENCOUNTER — Ambulatory Visit (INDEPENDENT_AMBULATORY_CARE_PROVIDER_SITE_OTHER): Payer: BC Managed Care – PPO | Admitting: Family Medicine

## 2019-11-01 ENCOUNTER — Encounter: Payer: Self-pay | Admitting: Family Medicine

## 2019-11-01 VITALS — BP 130/82 | HR 64 | Temp 97.7°F | Ht 65.0 in | Wt 149.4 lb

## 2019-11-01 DIAGNOSIS — M5412 Radiculopathy, cervical region: Secondary | ICD-10-CM

## 2019-11-01 DIAGNOSIS — R200 Anesthesia of skin: Secondary | ICD-10-CM

## 2019-11-01 DIAGNOSIS — R202 Paresthesia of skin: Secondary | ICD-10-CM | POA: Diagnosis not present

## 2019-11-01 DIAGNOSIS — R208 Other disturbances of skin sensation: Secondary | ICD-10-CM | POA: Diagnosis not present

## 2019-11-01 DIAGNOSIS — E041 Nontoxic single thyroid nodule: Secondary | ICD-10-CM

## 2019-11-01 DIAGNOSIS — R9082 White matter disease, unspecified: Secondary | ICD-10-CM

## 2019-11-01 MED ORDER — GABAPENTIN 300 MG PO CAPS: 300.0000 mg | ORAL_CAPSULE | Freq: Two times a day (BID) | ORAL | 1 refills | Status: DC | PRN

## 2019-11-01 NOTE — Telephone Encounter (Signed)
1. Have you developed a fever since your procedure? no  2.   Have you had an respiratory symptoms (SOB or cough) since your procedure? no  3.   Have you tested positive for COVID 19 since your procedure no  4.   Have you had any family members/close contacts diagnosed with the COVID 19 since your procedure?  no   If yes to any of these questions please route to Joylene John, RN and Erenest Rasher, RN Follow up Call-  Call back number 10/30/2019  Post procedure Call Back phone  # [phone]  Permission to leave phone message Yes  Some recent data might be hidden     Patient questions:  Do you have a fever, pain , or abdominal swelling? No. Pain Score  0 *  Have you tolerated food without any problems? Yes.    Have you been able to return to your normal activities? Yes.    Do you have any questions about your discharge instructions: Diet   No. Medications  No. Follow up visit  No.  Do you have questions or concerns about your Care? No.  Actions: * If pain score is 4 or above: No action needed, pain <4.

## 2019-11-01 NOTE — Progress Notes (Signed)
This visit was conducted in person.  BP 130/82 (BP Location: Left Arm, Patient Position: Sitting, Cuff Size: Normal)    Pulse 64    Temp 97.7 F (36.5 C) (Temporal)    Ht 5\' 5"  (1.651 m)    Wt 149 lb 7 oz (67.8 kg)    SpO2 99%    BMI 24.87 kg/m    CC: foot pain Subjective:    Patient ID: Elizabeth Long, female    DOB: Nov 01, 1960, 59 y.o.   MRN: [id number]  HPI: Elizabeth Long is a 59 y.o. female presenting on 11/01/2019 for Foot Pain (C/o bilateal foot pain, tingling and burning sensation.  Started about 6 mos ago. )   See prior note for details. Ongoing imbalance for 6 months. Recent diagnosis of possible with transverse myelitis, ?MS 07/2019 - saw Dr Felecia Shelling neurology with MR brain and MR cervical spine for further evaluation - without definitive diagnosis - LP for oligoclonal band CSF evaluation (negative). LP complicated by spinal tap headache that did improve with blood patch. Plan to repeat MRI next year. Discussing PT for balance training. She wants to first try gym and yoga.   She recently had colonoscopy (Nandigam) - trialing colestipol for post-cholecystectomy diarrhea.  She has not had covid vaccine.   Ongoing neck pain treating witih gabapentin 300mg  at night. She may want to have another neurosurg opinion in [place]. S/p C5/6 ACDF.   Notes ongoing burning of bilateral dorsal feet worse at night time when trying to sleep, also feels heat at soles as well as tingling/numbness. She also notes muscular thigh pain. No noted weakness but she does find ongoing imbalance/unsteadiness.   Recent CT neck incidentally found bilateral thyroid hypodensities. Desires to wait to get this evaluated for later this year.      Relevant past medical, surgical, family and social history reviewed and updated as indicated. Interim medical history since our last visit reviewed. Allergies and medications reviewed and updated. Outpatient Medications Prior to Visit  Medication Sig Dispense Refill    cetirizine (ZYRTEC) 10 MG tablet Take 10 mg by mouth daily as needed for allergies.     escitalopram (LEXAPRO) 5 MG tablet Take 1 tablet (5 mg total) by mouth daily. 90 tablet 3   estradiol (ESTRACE) 0.1 MG/GM vaginal cream Place 1 g vaginally 2 (two) times a week.     fluticasone (FLONASE) 50 MCG/ACT nasal spray Place 2 sprays into both nostrils daily. 16 g 1   hydrochlorothiazide (MICROZIDE) 12.5 MG capsule TAKE 1 CAPSULE BY MOUTH EVERY DAY 90 capsule 2   Lifitegrast (XIIDRA) 5 % SOLN Xiidra 5 % eye drops in a dropperette  INSTILL 1 DROP INTO EACH EYE TWICE DAILY     LORazepam (ATIVAN) 2 MG tablet Take 0.5 tablets (1 mg total) by mouth at bedtime.  0   meloxicam (MOBIC) 15 MG tablet Take 1 tablet (15 mg total) by mouth daily. 90 tablet 1   Multiple Vitamins-Minerals (CENTRUM SILVER 50+WOMEN) TABS Take 1 tablet by mouth daily.     omeprazole (PRILOSEC) 20 MG capsule TAKE 1 CAPSULE BY MOUTH EVERY DAY 90 capsule 2   traMADol (ULTRAM) 50 MG tablet Take 50 mg by mouth at bedtime as needed for moderate pain.      gabapentin (NEURONTIN) 100 MG capsule Take 1 capsule (100 mg total) by mouth 3 (three) times daily as needed. 270 capsule 1   imipramine (TOFRANIL) 25 MG tablet TAKE 1 TABLET BY MOUTH EVERYDAY AT BEDTIME 90  tablet 0   colestipol (COLESTID) 1 g tablet Take 1 tablet (1 g total) by mouth 2 (two) times daily. (Patient not taking: Reported on 11/01/2019) 60 tablet 3   Facility-Administered Medications Prior to Visit  Medication Dose Route Frequency Provider Last Rate Last Admin   0.9 %  sodium chloride infusion  500 mL Intravenous Once Nandigam, Venia Minks, MD         Per HPI unless specifically indicated in ROS section below Review of Systems Objective:  BP 130/82 (BP Location: Left Arm, Patient Position: Sitting, Cuff Size: Normal)    Pulse 64    Temp 97.7 F (36.5 C) (Temporal)    Ht 5\' 5"  (1.651 m)    Wt 149 lb 7 oz (67.8 kg)    SpO2 99%    BMI 24.87 kg/m   Wt Readings from  Last 3 Encounters:  11/01/19 149 lb 7 oz (67.8 kg)  10/30/19 146 lb (66.2 kg)  10/16/19 146 lb (66.2 kg)      Physical Exam Vitals and nursing note reviewed.  Constitutional:      Appearance: Normal appearance. She is not ill-appearing.  Neck:     Thyroid: Thyroid mass (bilat small thyroid nodules) present. No thyromegaly or thyroid tenderness.  Musculoskeletal:        General: Normal range of motion.     Cervical back: Normal range of motion and neck supple. No rigidity.     Right lower leg: No edema.     Left lower leg: No edema.     Comments:  No midline cervical or lumbar spine tenderness  2+ DP bilaterally  Skin:    General: Skin is warm and dry.     Findings: No erythema or rash.  Neurological:     Mental Status: She is alert.     Comments: Sensation intact to light touch and monofilament  Psychiatric:        Mood and Affect: Mood normal.        Behavior: Behavior normal.       Results for orders placed or performed in visit on 11/01/19  TSH  Result Value Ref Range   TSH 1.73 0.40 - 4.50 mIU/L  T4, free  Result Value Ref Range   Free T4 1.0 0.8 - 1.8 ng/dL  Comprehensive metabolic panel  Result Value Ref Range   Glucose, Bld 85 65 - 99 mg/dL   BUN 11 7 - 25 mg/dL   Creat 0.95 0.50 - 1.05 mg/dL   BUN/Creatinine Ratio NOT APPLICABLE 6 - 22 (calc)   Sodium 134 (L) 135 - 146 mmol/L   Potassium 4.2 3.5 - 5.3 mmol/L   Chloride 93 (L) 98 - 110 mmol/L   CO2 28 20 - 32 mmol/L   Calcium 9.5 8.6 - 10.4 mg/dL   Total Protein 6.6 6.1 - 8.1 g/dL   Albumin 4.3 3.6 - 5.1 g/dL   Globulin 2.3 1.9 - 3.7 g/dL (calc)   AG Ratio 1.9 1.0 - 2.5 (calc)   Total Bilirubin 0.3 0.2 - 1.2 mg/dL   Alkaline phosphatase (APISO) 53 37 - 153 U/L   AST 22 10 - 35 U/L   ALT 15 6 - 29 U/L  CBC with Differential/Platelet  Result Value Ref Range   WBC 7.7 3.8 - 10.8 Thousand/uL   RBC 4.08 3.80 - 5.10 Million/uL   Hemoglobin 12.3 11.7 - 15.5 g/dL   HCT 37.4 35.0 - 45.0 %   MCV 91.7 80.0  - 100.0 fL  MCH 30.1 27.0 - 33.0 pg   MCHC 32.9 32.0 - 36.0 g/dL   RDW 11.9 11.0 - 15.0 %   Platelets 292 140 - 400 Thousand/uL   MPV 9.9 7.5 - 12.5 fL   Neutro Abs 3,696 1,500 - 7,800 cells/uL   Lymphs Abs 3,442 850 - 3,900 cells/uL   Absolute Monocytes 439 200 - 950 cells/uL   Eosinophils Absolute 92 15 - 500 cells/uL   Basophils Absolute 31 0 - 200 cells/uL   Neutrophils Relative % 48 %   Total Lymphocyte 44.7 %   Monocytes Relative 5.7 %   Eosinophils Relative 1.2 %   Basophils Relative 0.4 %  Vitamin B12  Result Value Ref Range   Vitamin B-12 713 200 - 1,100 pg/mL    CT cervical spine IMPRESSION: Sequela of C5-6 ACDF without adverse features. Patent bony spinal canal. Multilevel spondylosis. Moderate right C4-5 bony neural foraminal narrowing. Otherwise mild bony neural foraminal narrowing at the C3-5 and C6-7 levels. Electronically Signed   By: Primitivo Gauze M.D.   On: 10/02/2019 19:11  MR CERVICAL SPINE W/W/O IMPRESSION 08/2019:  1.   There is a T2 hyperintense focus within the left lateral spinal cord adjacent to C2-C3 which was seen on the 2011 MRI.  No additional foci are noted.  Another T2 hyperintense focus is noted within the pons 2.    ACDF at C5-C6.  There is no spinal stenosis or nerve root compression at this level. 3.    At C4-C5, there is mild retrolisthesis reduced disc height and uncovertebral spurring causing moderate right foraminal narrowing but no nerve root compression or spinal stenosis.  This level has progressed compared to the previous MRI. 4.   Milder degenerative changes are noted at C2-C3, C3-C4 and C6-C7 that do not lead to nerve root compression or spinal stenosis. 5.   There is a normal enhancement pattern. INTERPRETING PHYSICIAN:  Richard A. Felecia Shelling, MD, PhD, FAAN Assessment & Plan:  This visit occurred during the SARS-CoV-2 public health emergency.  Safety protocols were in place, including screening questions prior to the visit,  additional usage of staff PPE, and extensive cleaning of exam room while observing appropriate contact time as indicated for disinfecting solutions.   Problem List Items Addressed This Visit    White matter abnormality on MRI of brain    See above, followed by neurology with planned rpt MRI in 1 year.      Thyroid nodule   Relevant Orders   TSH (Completed)   T4, free (Completed)   Leg numbness    MRI showing hyperintense focuses at cervical and lumbar spine, as of yet undetermined cause. appreciate neurology care. Planned rpt MRI in 1 year.       Cervical radiculopathy    Progressively worsening neck pain in known HNP s/p ACDF, pt interested in neurosurgery second opinion so will refer.       Relevant Medications   gabapentin (NEURONTIN) 300 MG capsule   Burning sensation of feet - Primary    Unclear etiology ?periph neuropathy related - check labwork today for reversible causes including B12. Will slowly taper gabapentin (she is only on 300mg  nightly). Update with effect.       Relevant Orders   TSH (Completed)   Comprehensive metabolic panel (Completed)   CBC with Differential/Platelet (Completed)   Vitamin B12 (Completed)       Meds ordered this encounter  Medications   gabapentin (NEURONTIN) 300 MG capsule    Sig: Take  1 capsule (300 mg total) by mouth 2 (two) times daily as needed.    Dispense:  180 capsule    Refill:  1   Orders Placed This Encounter  Procedures   TSH   T4, free   Comprehensive metabolic panel   CBC with Differential/Platelet   Vitamin B12    Patient Instructions  Lets check labs today. Start slow gabapentin taper - 200mg  with dinner and 200mg  with bedtime, increase to 300mg  capsules - 1 with dinner and 1 at bedtime.  Update [place] with effect.  We will refer you to new neurosurgeon.    Follow up plan: No follow-ups on file.  Ria Bush, MD

## 2019-11-01 NOTE — Patient Instructions (Addendum)
Lets check labs today. Start slow gabapentin taper - 200mg  with dinner and 200mg  with bedtime, increase to 300mg  capsules - 1 with dinner and 1 at bedtime.  Update [place] with effect.  We will refer you to new neurosurgeon.

## 2019-11-02 LAB — CBC WITH DIFFERENTIAL/PLATELET
Absolute Monocytes: 439 cells/uL (ref 200–950)
Basophils Absolute: 31 cells/uL (ref 0–200)
Basophils Relative: 0.4 %
Eosinophils Absolute: 92 cells/uL (ref 15–500)
Eosinophils Relative: 1.2 %
HCT: 37.4 % (ref 35.0–45.0)
Hemoglobin: 12.3 g/dL (ref 11.7–15.5)
Lymphs Abs: 3442 cells/uL (ref 850–3900)
MCH: 30.1 pg (ref 27.0–33.0)
MCHC: 32.9 g/dL (ref 32.0–36.0)
MCV: 91.7 fL (ref 80.0–100.0)
MPV: 9.9 fL (ref 7.5–12.5)
Monocytes Relative: 5.7 %
Neutro Abs: 3696 cells/uL (ref 1500–7800)
Neutrophils Relative %: 48 %
Platelets: 292 10*3/uL (ref 140–400)
RBC: 4.08 10*6/uL (ref 3.80–5.10)
RDW: 11.9 % (ref 11.0–15.0)
Total Lymphocyte: 44.7 %
WBC: 7.7 10*3/uL (ref 3.8–10.8)

## 2019-11-02 LAB — COMPREHENSIVE METABOLIC PANEL
AG Ratio: 1.9 (calc) (ref 1.0–2.5)
ALT: 15 U/L (ref 6–29)
AST: 22 U/L (ref 10–35)
Albumin: 4.3 g/dL (ref 3.6–5.1)
Alkaline phosphatase (APISO): 53 U/L (ref 37–153)
BUN: 11 mg/dL (ref 7–25)
CO2: 28 mmol/L (ref 20–32)
Calcium: 9.5 mg/dL (ref 8.6–10.4)
Chloride: 93 mmol/L — ABNORMAL LOW (ref 98–110)
Creat: 0.95 mg/dL (ref 0.50–1.05)
Globulin: 2.3 g/dL (calc) (ref 1.9–3.7)
Glucose, Bld: 85 mg/dL (ref 65–99)
Potassium: 4.2 mmol/L (ref 3.5–5.3)
Sodium: 134 mmol/L — ABNORMAL LOW (ref 135–146)
Total Bilirubin: 0.3 mg/dL (ref 0.2–1.2)
Total Protein: 6.6 g/dL (ref 6.1–8.1)

## 2019-11-02 LAB — TSH: TSH: 1.73 mIU/L (ref 0.40–4.50)

## 2019-11-02 LAB — T4, FREE: Free T4: 1 ng/dL (ref 0.8–1.8)

## 2019-11-02 LAB — VITAMIN B12: Vitamin B-12: 713 pg/mL (ref 200–1100)

## 2019-11-05 DIAGNOSIS — R208 Other disturbances of skin sensation: Secondary | ICD-10-CM | POA: Insufficient documentation

## 2019-11-05 DIAGNOSIS — E041 Nontoxic single thyroid nodule: Secondary | ICD-10-CM | POA: Insufficient documentation

## 2019-11-05 NOTE — Assessment & Plan Note (Signed)
MRI showing hyperintense focuses at cervical and lumbar spine, as of yet undetermined cause. appreciate neurology care. Planned rpt MRI in 1 year.

## 2019-11-05 NOTE — Assessment & Plan Note (Signed)
See above, followed by neurology with planned rpt MRI in 1 year.

## 2019-11-05 NOTE — Assessment & Plan Note (Signed)
Progressively worsening neck pain in known HNP s/p ACDF, pt interested in neurosurgery second opinion so will refer.

## 2019-11-05 NOTE — Assessment & Plan Note (Signed)
Unclear etiology ?periph neuropathy related - check labwork today for reversible causes including B12. Will slowly taper gabapentin (she is only on 300mg  nightly). Update with effect.

## 2019-11-07 ENCOUNTER — Encounter: Payer: Self-pay | Admitting: Gastroenterology

## 2019-11-12 DIAGNOSIS — M5412 Radiculopathy, cervical region: Secondary | ICD-10-CM | POA: Diagnosis not present

## 2019-11-22 ENCOUNTER — Other Ambulatory Visit: Payer: Self-pay | Admitting: Family Medicine

## 2019-12-01 ENCOUNTER — Encounter: Payer: Self-pay | Admitting: Family Medicine

## 2019-12-01 DIAGNOSIS — E041 Nontoxic single thyroid nodule: Secondary | ICD-10-CM

## 2019-12-04 DIAGNOSIS — R200 Anesthesia of skin: Secondary | ICD-10-CM | POA: Diagnosis not present

## 2019-12-13 ENCOUNTER — Other Ambulatory Visit: Payer: Self-pay

## 2019-12-13 ENCOUNTER — Ambulatory Visit
Admission: RE | Admit: 2019-12-13 | Discharge: 2019-12-13 | Disposition: A | Discharge status: Home / Self Care | Payer: BC Managed Care – PPO | Source: Ambulatory Visit | Attending: Family Medicine | Admitting: Family Medicine

## 2019-12-13 DIAGNOSIS — E041 Nontoxic single thyroid nodule: Secondary | ICD-10-CM | POA: Diagnosis not present

## 2019-12-13 DIAGNOSIS — E042 Nontoxic multinodular goiter: Secondary | ICD-10-CM | POA: Diagnosis not present

## 2020-01-04 ENCOUNTER — Other Ambulatory Visit: Payer: Self-pay | Admitting: Family Medicine

## 2020-01-06 NOTE — Telephone Encounter (Signed)
Electronic refill request. Dicyclomine Last office visit:   11/01/2019 Last Filled:   #30  0 RF on 04/07/2019 Please advise.

## 2020-04-04 ENCOUNTER — Other Ambulatory Visit: Payer: Self-pay | Admitting: Family Medicine

## 2020-04-05 ENCOUNTER — Other Ambulatory Visit: Payer: Self-pay | Admitting: Family Medicine

## 2020-04-05 DIAGNOSIS — I10 Essential (primary) hypertension: Secondary | ICD-10-CM

## 2020-04-05 DIAGNOSIS — E041 Nontoxic single thyroid nodule: Secondary | ICD-10-CM

## 2020-04-05 DIAGNOSIS — R7989 Other specified abnormal findings of blood chemistry: Secondary | ICD-10-CM

## 2020-04-08 ENCOUNTER — Other Ambulatory Visit (INDEPENDENT_AMBULATORY_CARE_PROVIDER_SITE_OTHER): Payer: BC Managed Care – PPO

## 2020-04-08 ENCOUNTER — Other Ambulatory Visit: Payer: Self-pay

## 2020-04-08 DIAGNOSIS — I10 Essential (primary) hypertension: Secondary | ICD-10-CM

## 2020-04-08 DIAGNOSIS — E041 Nontoxic single thyroid nodule: Secondary | ICD-10-CM | POA: Diagnosis not present

## 2020-04-08 DIAGNOSIS — R7989 Other specified abnormal findings of blood chemistry: Secondary | ICD-10-CM

## 2020-04-08 LAB — LIPID PANEL
Cholesterol: 205 mg/dL — ABNORMAL HIGH (ref 0–200)
HDL: 69.5 mg/dL (ref >39.00)
LDL Cholesterol: 118 mg/dL — ABNORMAL HIGH (ref 0–99)
NonHDL: 135.48
Total CHOL/HDL Ratio: 3
Triglycerides: 88 mg/dL (ref 0.0–149.0)
VLDL: 17.6 mg/dL (ref 0.0–40.0)

## 2020-04-08 LAB — COMPREHENSIVE METABOLIC PANEL
ALT: 12 U/L (ref 0–35)
AST: 20 U/L (ref 0–37)
Albumin: 4.3 g/dL (ref 3.5–5.2)
Alkaline Phosphatase: 49 U/L (ref 39–117)
BUN: 11 mg/dL (ref 6–23)
CO2: 32 mEq/L (ref 19–32)
Calcium: 9.7 mg/dL (ref 8.4–10.5)
Chloride: 100 mEq/L (ref 96–112)
Creatinine, Ser: 0.84 mg/dL (ref 0.40–1.20)
GFR: 76.08 mL/min (ref >60.00)
Glucose, Bld: 91 mg/dL (ref 70–99)
Potassium: 4.3 mEq/L (ref 3.5–5.1)
Sodium: 138 mEq/L (ref 135–145)
Total Bilirubin: 0.4 mg/dL (ref 0.2–1.2)
Total Protein: 6.9 g/dL (ref 6.0–8.3)

## 2020-04-08 LAB — MICROALBUMIN / CREATININE URINE RATIO
Creatinine,U: 106.4 mg/dL
Microalb Creat Ratio: 0.7 mg/g (ref 0.0–30.0)
Microalb, Ur: <0.7 mg/dL (ref 0.0–1.9)

## 2020-04-08 LAB — TSH: TSH: 1.58 u[IU]/mL (ref 0.35–4.50)

## 2020-04-09 ENCOUNTER — Other Ambulatory Visit: Payer: Self-pay | Admitting: Family Medicine

## 2020-04-09 NOTE — Telephone Encounter (Signed)
Meloxicam Last filled:  01/12/20, #90 Last OV:  07/17/19, low back pain Next OV:  110/10/21, CPE

## 2020-04-15 ENCOUNTER — Encounter: Payer: Self-pay | Admitting: Family Medicine

## 2020-04-15 ENCOUNTER — Other Ambulatory Visit: Payer: Self-pay

## 2020-04-15 ENCOUNTER — Ambulatory Visit (INDEPENDENT_AMBULATORY_CARE_PROVIDER_SITE_OTHER): Payer: BC Managed Care – PPO | Admitting: Family Medicine

## 2020-04-15 VITALS — BP 122/70 | HR 62 | Temp 97.7°F | Ht 64.75 in | Wt 149.1 lb

## 2020-04-15 DIAGNOSIS — K219 Gastro-esophageal reflux disease without esophagitis: Secondary | ICD-10-CM

## 2020-04-15 DIAGNOSIS — R208 Other disturbances of skin sensation: Secondary | ICD-10-CM | POA: Diagnosis not present

## 2020-04-15 DIAGNOSIS — K52831 Collagenous colitis: Secondary | ICD-10-CM

## 2020-04-15 DIAGNOSIS — Z0001 Encounter for general adult medical examination with abnormal findings: Secondary | ICD-10-CM | POA: Diagnosis not present

## 2020-04-15 DIAGNOSIS — F418 Other specified anxiety disorders: Secondary | ICD-10-CM

## 2020-04-15 DIAGNOSIS — Z0000 Encounter for general adult medical examination without abnormal findings: Secondary | ICD-10-CM

## 2020-04-15 DIAGNOSIS — R9082 White matter disease, unspecified: Secondary | ICD-10-CM | POA: Diagnosis not present

## 2020-04-15 DIAGNOSIS — E041 Nontoxic single thyroid nodule: Secondary | ICD-10-CM

## 2020-04-15 DIAGNOSIS — R7989 Other specified abnormal findings of blood chemistry: Secondary | ICD-10-CM

## 2020-04-15 DIAGNOSIS — G373 Acute transverse myelitis in demyelinating disease of central nervous system: Secondary | ICD-10-CM

## 2020-04-15 DIAGNOSIS — Z23 Encounter for immunization: Secondary | ICD-10-CM | POA: Diagnosis not present

## 2020-04-15 DIAGNOSIS — I10 Essential (primary) hypertension: Secondary | ICD-10-CM

## 2020-04-15 DIAGNOSIS — M5412 Radiculopathy, cervical region: Secondary | ICD-10-CM

## 2020-04-15 DIAGNOSIS — R937 Abnormal findings on diagnostic imaging of other parts of musculoskeletal system: Secondary | ICD-10-CM

## 2020-04-15 MED ORDER — NORTRIPTYLINE HCL 25 MG PO CAPS: 25.0000 mg | ORAL_CAPSULE | Freq: Every day | ORAL | 6 refills | Status: DC

## 2020-04-15 MED ORDER — HYDROXYZINE HCL 25 MG PO TABS: 25.0000 mg | ORAL_TABLET | Freq: Two times a day (BID) | ORAL | 1 refills | Status: DC | PRN

## 2020-04-15 MED ORDER — FERROUS SULFATE 324 (65 FE) MG PO TBEC: 1.0000 | DELAYED_RELEASE_TABLET | Freq: Every day | ORAL | Status: DC

## 2020-04-15 MED ORDER — HYDROXYZINE HCL 25 MG PO TABS
25.0000 mg | ORAL_TABLET | Freq: Every day | ORAL | 1 refills | Status: DC
Start: 2020-04-15 — End: 2020-10-13

## 2020-04-15 MED ORDER — ESCITALOPRAM OXALATE 5 MG PO TABS
5.0000 mg | ORAL_TABLET | Freq: Every day | ORAL | 3 refills | Status: DC
Start: 2020-04-15 — End: 2021-05-19

## 2020-04-15 NOTE — Progress Notes (Signed)
This visit was conducted in person.  BP 122/70 (BP Location: Left Arm, Patient Position: Sitting, Cuff Size: Normal)   Pulse 62   Temp 97.7 F (36.5 C) (Temporal)   Ht 5' 4.75" (1.645 m)   Wt 149 lb 2 oz (67.6 kg)   SpO2 98%   BMI 25.01 kg/m    CC: CPE Subjective:    Patient ID: Elizabeth Long, female    DOB: 07/03/1960, 59 y.o.   MRN: [id number]  HPI: Elizabeth Long is a 59 y.o. female presenting on 04/15/2020 for Annual Exam (Pt states she resumed hydroxyzine but only 1/2 tab.  Requests refill. )   Declines med refills at this time.  Friend passed away from [place] last month.   Possible transverse myelitis vs early multiple sclerosis (07/2019 [place]) initial MRI showing hyperintense focuses at cervical and lumbar spine, planned rpt MRI in 2022.   Saw Dr Lacinda Axon neurosurgery 11/2019 for second opinion - no surgical options, ?MS related pain.  NCS/EMG 11/2019 - normal study.   Notes progressively worsening foot pain. Describes "sandpaper" feeling to feet with some burning. Taking gabapentin 300/600mg  daily. Ongoing leg pains from back down.   Notes worsening anxiety related to work stressors. She continues lexapro 5mg  daily, has recently restarted hydroxyzine nightly.   Ongoing chronic diarrhea - colestipol didn't help - she took 1 tablet twice daily. Needs imodium when she leaves the house.   Taking meloxicam 15mg  daily - for osteoarthritis. Turmeric didn't help.   Preventative: Colonoscopy 09/2016 - 58mm TA, rpt 3 yrs (Nandigam) Colonoscopy 10/2019 - 81mm HP, int hem, rpt 5 yrs (Nandigam) - rec trial colestipol for chronic diarrhea.  Well woman with OBGYN Dr Wein10/2019 - upcoming appt,s/p hysterectomy (endometriosis). Ovaries remain. Upcoming appt 05/2020. Mammogram at that time Breast cyst aspirated on Monday by Dr Stann Mainland - pending results. Mammo 05/2019 birads1-yearly,dense breasts s/p 3D mammogram.  Lung cancer screening - not eligible Flu shot yearly  Tdap9/2017  COVID  vaccine Pfizer 11/2019 x2 Shingrix - requests today.  Seat belt use discussed  Sunscreen use discussed. No changing moles on skin.  Ex smoker- quit 2000. Prior 2 ppd. 20+ PY hx.  Alcohol use - none - sober since 1994. 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 Interim medical history since our last visit reviewed. Allergies and medications reviewed and updated. Outpatient Medications Prior to Visit  Medication Sig Dispense Refill  . cetirizine (ZYRTEC) 10 MG tablet Take 10 mg by mouth daily as needed for allergies.    Marland Kitchen estradiol (ESTRACE) 0.1 MG/GM vaginal cream Place 1 g vaginally 2 (two) times a week.    . fluticasone (FLONASE) 50 MCG/ACT nasal spray Place 2 sprays into both nostrils daily. 16 g 1  . gabapentin (NEURONTIN) 300 MG capsule Take 1 capsule (300 mg total) by mouth 2 (two) times daily as needed. 180 capsule 1  . hydrochlorothiazide (MICROZIDE) 12.5 MG capsule TAKE 1 CAPSULE BY MOUTH EVERY DAY 90 capsule 2  . Lifitegrast (XIIDRA) 5 % SOLN Xiidra 5 % eye drops in a dropperette  INSTILL 1 DROP INTO EACH EYE TWICE DAILY    . LORazepam (ATIVAN) 2 MG tablet Take 0.5 tablets (1 mg total) by mouth at bedtime.  0  . meloxicam (  MOBIC) 15 MG tablet Take 0.5 tablets (7.5 mg total) by mouth daily.    . Multiple Vitamins-Minerals (CENTRUM SILVER 50+WOMEN) TABS Take 1 tablet by mouth daily.    Marland Kitchen omeprazole (PRILOSEC) 20 MG capsule TAKE 1 CAPSULE BY MOUTH EVERY DAY 90 capsule 2  . traMADol (ULTRAM) 50 MG tablet Take 50 mg by mouth at bedtime as needed for moderate pain.     Marland Kitchen escitalopram (LEXAPRO) 5 MG tablet TAKE 1 TABLET BY MOUTH EVERY DAY 90 tablet 0  . meloxicam (MOBIC) 15 MG tablet TAKE 1 TABLET BY MOUTH EVERY DAY  90 tablet 1  . colestipol (COLESTID) 1 g tablet Take 1 tablet (1 g total) by mouth 2 (two) times daily. 60 tablet 3  . dicyclomine (BENTYL) 10 MG capsule TAKE 1-2 CAPSULES (10-20 MG TOTAL) BY MOUTH 3 (THREE) TIMES DAILY BEFORE MEALS. 30 capsule 0   Facility-Administered Medications Prior to Visit  Medication Dose Route Frequency Provider Last Rate Last Admin  . 0.9 %  sodium chloride infusion  500 mL Intravenous Once Nandigam, Kavitha V, MD         Per HPI unless specifically indicated in ROS section below Review of Systems  Constitutional: Negative for activity change, appetite change, chills, fatigue, fever and unexpected weight change.  HENT: Negative for hearing loss.   Eyes: Negative for visual disturbance.  Respiratory: Negative for cough, chest tightness, shortness of breath and wheezing.   Cardiovascular: Negative for chest pain, palpitations and leg swelling.  Gastrointestinal: Positive for diarrhea (chronic). Negative for abdominal distention, abdominal pain, blood in stool, constipation, nausea and vomiting.  Genitourinary: Negative for difficulty urinating and hematuria.  Musculoskeletal: Negative for arthralgias, myalgias and neck pain.  Skin: Negative for rash.  Neurological: Negative for dizziness, seizures, syncope and headaches.  Hematological: Negative for adenopathy. Does not bruise/bleed easily.  Psychiatric/Behavioral: Negative for dysphoric mood. The patient is not nervous/anxious.    Objective:  BP 122/70 (BP Location: Left Arm, Patient Position: Sitting, Cuff Size: Normal)   Pulse 62   Temp 97.7 F (36.5 C) (Temporal)   Ht 5' 4.75" (1.645 m)   Wt 149 lb 2 oz (67.6 kg)   SpO2 98%   BMI 25.01 kg/m   Wt Readings from Last 3 Encounters:  04/15/20 149 lb 2 oz (67.6 kg)  11/01/19 149 lb 7 oz (67.8 kg)  10/30/19 146 lb (66.2 kg)      Physical Exam Vitals and nursing note reviewed.  Constitutional:      General: She is not in acute distress.    Appearance:  Normal appearance. She is well-developed. She is not ill-appearing.  HENT:     Head: Normocephalic and atraumatic.     Right Ear: Hearing, tympanic membrane, ear canal and external ear normal.     Left Ear: Hearing, tympanic membrane, ear canal and external ear normal.     Mouth/Throat:     Pharynx: Uvula midline.  Eyes:     General: No scleral icterus.    Extraocular Movements: Extraocular movements intact.     Conjunctiva/sclera: Conjunctivae normal.     Pupils: Pupils are equal, round, and reactive to light.  Cardiovascular:     Rate and Rhythm: Normal rate and regular rhythm.     Pulses: Normal pulses.          Radial pulses are 2+ on the right side and 2+ on the left side.     Heart sounds: Normal heart sounds. No murmur heard.   Pulmonary:  Effort: Pulmonary effort is normal. No respiratory distress.     Breath sounds: Normal breath sounds. No wheezing, rhonchi or rales.  Abdominal:     General: Abdomen is flat. Bowel sounds are normal. There is no distension.     Palpations: Abdomen is soft. There is no mass.     Tenderness: There is no abdominal tenderness. There is no guarding or rebound.     Hernia: No hernia is present.  Musculoskeletal:        General: Normal range of motion.     Cervical back: Normal range of motion and neck supple.     Right lower leg: No edema.     Left lower leg: No edema.  Lymphadenopathy:     Cervical: No cervical adenopathy.  Skin:    General: Skin is warm and dry.     Findings: No rash.  Neurological:     General: No focal deficit present.     Mental Status: She is alert and oriented to person, place, and time.     Comments: CN grossly intact, station and gait intact  Psychiatric:        Mood and Affect: Mood normal.        Behavior: Behavior normal.        Thought Content: Thought content normal.        Judgment: Judgment normal.       Results for orders placed or performed in visit on 04/08/20  Microalbumin / creatinine urine  ratio  Result Value Ref Range   Microalb, Ur <0.7 0.0 - 1.9 mg/dL   Creatinine,U 106.4 mg/dL   Microalb Creat Ratio 0.7 0.0 - 30.0 mg/g  TSH  Result Value Ref Range   TSH 1.58 0.35 - 4.50 uIU/mL  Comprehensive metabolic panel  Result Value Ref Range   Sodium 138 135 - 145 mEq/L   Potassium 4.3 3.5 - 5.1 mEq/L   Chloride 100 96 - 112 mEq/L   CO2 32 19 - 32 mEq/L   Glucose, Bld 91 70 - 99 mg/dL   BUN 11 6 - 23 mg/dL   Creatinine, Ser 0.84 0.40 - 1.20 mg/dL   Total Bilirubin 0.4 0.2 - 1.2 mg/dL   Alkaline Phosphatase 49 39 - 117 U/L   AST 20 0 - 37 U/L   ALT 12 0 - 35 U/L   Total Protein 6.9 6.0 - 8.3 g/dL   Albumin 4.3 3.5 - 5.2 g/dL   GFR 76.08 >60.00 mL/min   Calcium 9.7 8.4 - 10.5 mg/dL  Lipid panel  Result Value Ref Range   Cholesterol 205 (H) 0 - 200 mg/dL   Triglycerides 88.0 0 - 149 mg/dL   HDL 69.50 >39.00 mg/dL   VLDL 17.6 0.0 - 40.0 mg/dL   LDL Cholesterol 118 (H) 0 - 99 mg/dL   Total CHOL/HDL Ratio 3    NonHDL 135.48    Depression screen [facility] 2/9 04/15/2020 05/23/2018 03/14/2018 03/01/2017 08/01/2016  Decreased Interest 1 1 0 0 0  Down, Depressed, Hopeless 0 1 1 1 1   PHQ - 2 Score 1 2 1 1 1   Altered sleeping 1 2 3  - 1  Tired, decreased energy 1 2 0 - 1  Change in appetite 0 2 0 - 0  Feeling bad or failure about yourself  0 0 1 - 1  Trouble concentrating 0 0 1 - 1  Moving slowly or fidgety/restless 0 0 0 - 0  Suicidal thoughts 0 0 0 - 0  PHQ-9  Score 3 8 6  - 5  Difficult doing work/chores - - - - Not difficult at all   GAD 7 : Generalized Anxiety Score 04/15/2020 05/23/2018 03/14/2018 08/01/2016  Nervous, Anxious, on Edge 1 0 2 0  Control/stop worrying 0 0 3 1  Worry too much - different things 0 0 3 1  Trouble relaxing 0 0 3 1  Restless 0 0 1 0  Easily annoyed or irritable 1 0 2 0  Afraid - awful might happen 0 0 0 0  Total GAD 7 Score 2 0 14 3  Anxiety Difficulty - - - -   Assessment & Plan:  This visit occurred during the SARS-CoV-2 public health  emergency.  Safety protocols were in place, including screening questions prior to the visit, additional usage of staff PPE, and extensive cleaning of exam room while observing appropriate contact time as indicated for disinfecting solutions.   Problem List Items Addressed This Visit    White matter abnormality on MRI of brain    ?multiple sclerosis vs transverse myelitis vs other followed by neurology Felecia Shelling) upcoming rpt advanced imaging planned.       Transverse myelitis ([place])   Thyroid nodule    Will be due for rpt thyroid US 12/2020      Microscopic colitis    Ongoing chronic diarrhea. Seeing GI. Colestipol didn't help.       High serum high density lipoprotein (HDL)    HLD driven by high HDL.  Continue off statin.  The 10-year ASCVD risk score Mikey Bussing DC Brooke Bonito., et al., 2013) is: 3.1%   Values used to calculate the score:     Age: 45 years     Sex: Female     Is Non-Hispanic African American: No     Diabetic: No     Tobacco smoker: No     Systolic Blood Pressure: 629 mmHg     Is BP treated: Yes     HDL Cholesterol: 69.5 mg/dL     Total Cholesterol: 205 mg/dL       GERD (gastroesophageal reflux disease)    Stable period on lwo dose omeprazole.       Essential hypertension    Chronic, stable. Continue hctz.       Encounter for general adult medical examination with abnormal findings - Primary    Preventative protocols reviewed and updated unless pt declined. Discussed healthy diet and lifestyle.       Cervical radiculopathy    Saw neurology for second opinion Lacinda Axon) - no surgical intervention recommended.  Trial nortriptyline at night.       Relevant Medications   hydrOXYzine (ATARAX/VISTARIL) 25 MG tablet   escitalopram (LEXAPRO) 5 MG tablet   nortriptyline (PAMELOR) 25 MG capsule   Burning sensation of feet    Ongoing. See below.  Trial nortriptyline 25mg  nightly in place of hydroxyzine.       Anxiety associated with depression    Continues lexapro. Has  restarted hydroxyzine at night time.  See below- will trial TCA in its place.      Relevant Medications   hydrOXYzine (ATARAX/VISTARIL) 25 MG tablet   escitalopram (LEXAPRO) 5 MG tablet   nortriptyline (PAMELOR) 25 MG capsule   Abnormal MRI, lumbar spine    Established with neurology .       Other Visit Diagnoses    Need for shingles vaccine       Relevant Orders   Varicella-zoster vaccine IM (Completed)  Meds ordered this encounter  Medications  . ferrous sulfate 324 (65 Fe) MG TBEC    Sig: Take 1 tablet (325 mg total) by mouth daily.  Marland Kitchen DISCONTD: hydrOXYzine (ATARAX/VISTARIL) 25 MG tablet    Sig: Take 1 tablet (25 mg total) by mouth 2 (two) times daily as needed for anxiety.    Dispense:  40 tablet    Refill:  1  . hydrOXYzine (ATARAX/VISTARIL) 25 MG tablet    Sig: Take 1 tablet (25 mg total) by mouth at bedtime.    Dispense:  90 tablet    Refill:  1  . escitalopram (LEXAPRO) 5 MG tablet    Sig: Take 1 tablet (5 mg total) by mouth daily.    Dispense:  90 tablet    Refill:  3  . nortriptyline (PAMELOR) 25 MG capsule    Sig: Take 1 capsule (25 mg total) by mouth at bedtime.    Dispense:  30 capsule    Refill:  6   Orders Placed This Encounter  Procedures  . Varicella-zoster vaccine IM    Patient instructions: First shingrix vaccine today. Return in 2-6 months for nurse visit for second dose.  Trial nortriptyline 25mg  nightly - this may replace the nightly hydroxyzine Cut meloxicam in half (7.5mg  dose). If not helping, stop.  Touch base with Dr Felecia Shelling if worsening symptoms.  Good to see you today, return as needed or in 6 months for follow up visit, sooner if needed, let me know how we do with above change.   Follow up plan: Return in about 6 months (around 10/13/2020) for follow up visit.  Ria Bush, MD

## 2020-04-15 NOTE — Assessment & Plan Note (Signed)
Preventative protocols reviewed and updated unless pt declined. Discussed healthy diet and lifestyle.  

## 2020-04-15 NOTE — Patient Instructions (Addendum)
First shingrix vaccine today. Return in 2-6 months for nurse visit for second dose.  Trial nortriptyline 25mg  nightly - this may replace the nightly hydroxyzine Cut meloxicam in half (7.5mg  dose). If not helping, stop.  Touch base with Dr Felecia Shelling if worsening symptoms.  Good to see you today, return as needed or in 6 months for follow up visit, sooner if needed, let me know how we do with above change.   Health Maintenance for Postmenopausal Women Menopause is a normal process in which your ability to get pregnant comes to an end. This process happens slowly over many months or years, usually between the ages of 66 and 59. Menopause is complete when you have missed your menstrual periods for 12 months. It is important to talk with your health care provider about some of the most common conditions that affect women after menopause (postmenopausal women). These include heart disease, cancer, and bone loss (osteoporosis). Adopting a healthy lifestyle and getting preventive care can help to promote your health and wellness. The actions you take can also lower your chances of developing some of these common conditions. What should I know about menopause? During menopause, you may get a number of symptoms, such as:  Hot flashes. These can be moderate or severe.  Night sweats.  Decrease in sex drive.  Mood swings.  Headaches.  Tiredness.  Irritability.  Memory problems.  Insomnia. Choosing to treat or not to treat these symptoms is a decision that you make with your health care provider. Do I need hormone replacement therapy?  Hormone replacement therapy is effective in treating symptoms that are caused by menopause, such as hot flashes and night sweats.  Hormone replacement carries certain risks, especially as you become older. If you are thinking about using estrogen or estrogen with progestin, discuss the benefits and risks with your health care provider. What is my risk for heart  disease and stroke? The risk of heart disease, heart attack, and stroke increases as you age. One of the causes may be a change in the body's hormones during menopause. This can affect how your body uses dietary fats, triglycerides, and cholesterol. Heart attack and stroke are medical emergencies. There are many things that you can do to help prevent heart disease and stroke. Watch your blood pressure  High blood pressure causes heart disease and increases the risk of stroke. This is more likely to develop in people who have high blood pressure readings, are of African descent, or are overweight.  Have your blood pressure checked: ? Every 3-5 years if you are 56-62 years of age. ? Every year if you are 26 years old or older. Eat a healthy diet   Eat a diet that includes plenty of vegetables, fruits, low-fat dairy products, and lean protein.  Do not eat a lot of foods that are high in solid fats, added sugars, or sodium. Get regular exercise Get regular exercise. This is one of the most important things you can do for your health. Most adults should:  Try to exercise for at least 150 minutes each week. The exercise should increase your heart rate and make you sweat (moderate-intensity exercise).  Try to do strengthening exercises at least twice each week. Do these in addition to the moderate-intensity exercise.  Spend less time sitting. Even light physical activity can be beneficial. Other tips  Work with your health care provider to achieve or maintain a healthy weight.  Do not use any products that contain nicotine or tobacco,  such as cigarettes, e-cigarettes, and chewing tobacco. If you need help quitting, ask your health care provider.  Know your numbers. Ask your health care provider to check your cholesterol and your blood sugar (glucose). Continue to have your blood tested as directed by your health care provider. Do I need screening for cancer? Depending on your health history  and family history, you may need to have cancer screening at different stages of your life. This may include screening for:  Breast cancer.  Cervical cancer.  Lung cancer.  Colorectal cancer. What is my risk for osteoporosis? After menopause, you may be at increased risk for osteoporosis. Osteoporosis is a condition in which bone destruction happens more quickly than new bone creation. To help prevent osteoporosis or the bone fractures that can happen because of osteoporosis, you may take the following actions:  If you are 61-6 years old, get at least 1,000 mg of calcium and at least 600 mg of vitamin D per day.  If you are older than age 56 but younger than age 21, get at least 1,200 mg of calcium and at least 600 mg of vitamin D per day.  If you are older than age 47, get at least 1,200 mg of calcium and at least 800 mg of vitamin D per day. Smoking and drinking excessive alcohol increase the risk of osteoporosis. Eat foods that are rich in calcium and vitamin D, and do weight-bearing exercises several times each week as directed by your health care provider. How does menopause affect my mental health? Depression may occur at any age, but it is more common as you become older. Common symptoms of depression include:  Low or sad mood.  Changes in sleep patterns.  Changes in appetite or eating patterns.  Feeling an overall lack of motivation or enjoyment of activities that you previously enjoyed.  Frequent crying spells. Talk with your health care provider if you think that you are experiencing depression. General instructions See your health care provider for regular wellness exams and vaccines. This may include:  Scheduling regular health, dental, and eye exams.  Getting and maintaining your vaccines. These include: ? Influenza vaccine. Get this vaccine each year before the flu season begins. ? Pneumonia vaccine. ? Shingles vaccine. ? Tetanus, diphtheria, and pertussis  (Tdap) booster vaccine. Your health care provider may also recommend other immunizations. Tell your health care provider if you have ever been abused or do not feel safe at home. Summary  Menopause is a normal process in which your ability to get pregnant comes to an end.  This condition causes hot flashes, night sweats, decreased interest in sex, mood swings, headaches, or lack of sleep.  Treatment for this condition may include hormone replacement therapy.  Take actions to keep yourself healthy, including exercising regularly, eating a healthy diet, watching your weight, and checking your blood pressure and blood sugar levels.  Get screened for cancer and depression. Make sure that you are up to date with all your vaccines. This information is not intended to replace advice given to you by your health care provider. Make sure you discuss any questions you have with your health care provider. Document Revised: 05/16/2018 Document Reviewed: 05/16/2018 Elsevier Patient Education  2020 [employer].

## 2020-04-16 NOTE — Assessment & Plan Note (Addendum)
Ongoing. See below.  Trial nortriptyline 25mg  nightly in place of hydroxyzine.

## 2020-04-16 NOTE — Assessment & Plan Note (Addendum)
HLD driven by high HDL.  Continue off statin.  The 10-year ASCVD risk score Mikey Bussing DC Brooke Bonito., et al., 2013) is: 3.1%   Values used to calculate the score:     Age: 59 years     Sex: Female     Is Non-Hispanic African American: No     Diabetic: No     Tobacco smoker: No     Systolic Blood Pressure: 525 mmHg     Is BP treated: Yes     HDL Cholesterol: 69.5 mg/dL     Total Cholesterol: 205 mg/dL

## 2020-04-16 NOTE — Assessment & Plan Note (Signed)
Stable period on lwo dose omeprazole.

## 2020-04-16 NOTE — Assessment & Plan Note (Signed)
Will be due for rpt thyroid US 12/2020

## 2020-04-16 NOTE — Assessment & Plan Note (Addendum)
Saw neurology for second opinion Lacinda Axon) - no surgical intervention recommended.  Trial nortriptyline at night.

## 2020-04-16 NOTE — Assessment & Plan Note (Signed)
Chronic, stable. Continue hctz.

## 2020-04-16 NOTE — Assessment & Plan Note (Signed)
Continues lexapro. Has restarted hydroxyzine at night time.  See below- will trial TCA in its place.

## 2020-04-16 NOTE — Assessment & Plan Note (Signed)
?  multiple sclerosis vs transverse myelitis vs other followed by neurology Felecia Shelling) upcoming rpt advanced imaging planned.

## 2020-04-16 NOTE — Assessment & Plan Note (Signed)
Ongoing chronic diarrhea. Seeing GI. Colestipol didn't help.

## 2020-04-16 NOTE — Assessment & Plan Note (Signed)
Established with neurology .

## 2020-05-09 ENCOUNTER — Other Ambulatory Visit: Payer: Self-pay | Admitting: Family Medicine

## 2020-05-12 NOTE — Telephone Encounter (Signed)
Pharmacy requests refill on: Nortriptyline HCL 25 mg   LAST REFILL: 04/15/2020 (Q-30, R-6) *Requesting 90 day supply prescription* LAST OV: 04/15/2020 NEXT OV: 10/13/2020 PHARMACY: [id number] [place], [place]

## 2020-05-19 DIAGNOSIS — M25551 Pain in right hip: Secondary | ICD-10-CM | POA: Diagnosis not present

## 2020-05-19 DIAGNOSIS — M4807 Spinal stenosis, lumbosacral region: Secondary | ICD-10-CM | POA: Diagnosis not present

## 2020-05-19 DIAGNOSIS — M25552 Pain in left hip: Secondary | ICD-10-CM | POA: Diagnosis not present

## 2020-05-19 DIAGNOSIS — M48061 Spinal stenosis, lumbar region without neurogenic claudication: Secondary | ICD-10-CM | POA: Diagnosis not present

## 2020-05-19 DIAGNOSIS — M5416 Radiculopathy, lumbar region: Secondary | ICD-10-CM | POA: Diagnosis not present

## 2020-05-19 DIAGNOSIS — M4316 Spondylolisthesis, lumbar region: Secondary | ICD-10-CM | POA: Diagnosis not present

## 2020-05-19 DIAGNOSIS — M5116 Intervertebral disc disorders with radiculopathy, lumbar region: Secondary | ICD-10-CM | POA: Diagnosis not present

## 2020-05-19 DIAGNOSIS — M160 Bilateral primary osteoarthritis of hip: Secondary | ICD-10-CM | POA: Diagnosis not present

## 2020-05-25 DIAGNOSIS — Z1231 Encounter for screening mammogram for malignant neoplasm of breast: Secondary | ICD-10-CM | POA: Diagnosis not present

## 2020-05-25 DIAGNOSIS — Z01419 Encounter for gynecological examination (general) (routine) without abnormal findings: Secondary | ICD-10-CM | POA: Diagnosis not present

## 2020-05-25 DIAGNOSIS — Z6824 Body mass index (BMI) 24.0-24.9, adult: Secondary | ICD-10-CM | POA: Diagnosis not present

## 2020-05-25 LAB — HM MAMMOGRAPHY

## 2020-06-04 ENCOUNTER — Encounter: Payer: Self-pay | Admitting: Family Medicine

## 2020-06-09 DIAGNOSIS — M5442 Lumbago with sciatica, left side: Secondary | ICD-10-CM | POA: Diagnosis not present

## 2020-06-09 DIAGNOSIS — G8929 Other chronic pain: Secondary | ICD-10-CM | POA: Diagnosis not present

## 2020-06-09 DIAGNOSIS — M48061 Spinal stenosis, lumbar region without neurogenic claudication: Secondary | ICD-10-CM | POA: Diagnosis not present

## 2020-06-09 DIAGNOSIS — M5441 Lumbago with sciatica, right side: Secondary | ICD-10-CM | POA: Diagnosis not present

## 2020-06-18 DIAGNOSIS — M5442 Lumbago with sciatica, left side: Secondary | ICD-10-CM | POA: Diagnosis not present

## 2020-06-18 DIAGNOSIS — M5441 Lumbago with sciatica, right side: Secondary | ICD-10-CM | POA: Diagnosis not present

## 2020-06-18 DIAGNOSIS — M48061 Spinal stenosis, lumbar region without neurogenic claudication: Secondary | ICD-10-CM | POA: Diagnosis not present

## 2020-07-01 DIAGNOSIS — M48061 Spinal stenosis, lumbar region without neurogenic claudication: Secondary | ICD-10-CM | POA: Diagnosis not present

## 2020-07-01 DIAGNOSIS — M5442 Lumbago with sciatica, left side: Secondary | ICD-10-CM | POA: Diagnosis not present

## 2020-07-01 DIAGNOSIS — M25551 Pain in right hip: Secondary | ICD-10-CM | POA: Diagnosis not present

## 2020-07-01 DIAGNOSIS — G8929 Other chronic pain: Secondary | ICD-10-CM | POA: Diagnosis not present

## 2020-07-09 DIAGNOSIS — G8929 Other chronic pain: Secondary | ICD-10-CM | POA: Diagnosis not present

## 2020-07-09 DIAGNOSIS — M25551 Pain in right hip: Secondary | ICD-10-CM | POA: Diagnosis not present

## 2020-07-15 ENCOUNTER — Encounter: Payer: Self-pay | Admitting: Family Medicine

## 2020-07-20 MED ORDER — GABAPENTIN 300 MG PO CAPS: ORAL_CAPSULE | ORAL | 1 refills | Status: DC

## 2020-07-20 NOTE — Addendum Note (Signed)
Addended by: Ria Bush on: 07/20/2020 08:45 AM   Modules accepted: Orders

## 2020-07-22 DIAGNOSIS — M48061 Spinal stenosis, lumbar region without neurogenic claudication: Secondary | ICD-10-CM | POA: Diagnosis not present

## 2020-07-22 DIAGNOSIS — G8929 Other chronic pain: Secondary | ICD-10-CM | POA: Diagnosis not present

## 2020-07-22 DIAGNOSIS — M5442 Lumbago with sciatica, left side: Secondary | ICD-10-CM | POA: Diagnosis not present

## 2020-07-22 DIAGNOSIS — M25551 Pain in right hip: Secondary | ICD-10-CM | POA: Diagnosis not present

## 2020-07-30 DIAGNOSIS — M5442 Lumbago with sciatica, left side: Secondary | ICD-10-CM | POA: Diagnosis not present

## 2020-07-30 DIAGNOSIS — M48061 Spinal stenosis, lumbar region without neurogenic claudication: Secondary | ICD-10-CM | POA: Diagnosis not present

## 2020-07-30 DIAGNOSIS — M5441 Lumbago with sciatica, right side: Secondary | ICD-10-CM | POA: Diagnosis not present

## 2020-08-12 ENCOUNTER — Other Ambulatory Visit: Payer: Self-pay | Admitting: Physical Medicine & Rehabilitation

## 2020-08-12 DIAGNOSIS — M25551 Pain in right hip: Secondary | ICD-10-CM | POA: Diagnosis not present

## 2020-08-12 DIAGNOSIS — M5442 Lumbago with sciatica, left side: Secondary | ICD-10-CM | POA: Diagnosis not present

## 2020-08-12 DIAGNOSIS — M5441 Lumbago with sciatica, right side: Secondary | ICD-10-CM | POA: Diagnosis not present

## 2020-08-12 DIAGNOSIS — M48061 Spinal stenosis, lumbar region without neurogenic claudication: Secondary | ICD-10-CM | POA: Diagnosis not present

## 2020-08-13 ENCOUNTER — Encounter: Payer: Self-pay | Admitting: Family Medicine

## 2020-08-23 ENCOUNTER — Ambulatory Visit
Admission: RE | Admit: 2020-08-23 | Discharge: 2020-08-23 | Disposition: A | Discharge status: Home / Self Care | Payer: BC Managed Care – PPO | Source: Ambulatory Visit | Attending: Physical Medicine & Rehabilitation | Admitting: Physical Medicine & Rehabilitation

## 2020-08-23 ENCOUNTER — Other Ambulatory Visit: Payer: Self-pay

## 2020-08-23 DIAGNOSIS — M5442 Lumbago with sciatica, left side: Secondary | ICD-10-CM | POA: Insufficient documentation

## 2020-08-23 DIAGNOSIS — M545 Low back pain, unspecified: Secondary | ICD-10-CM | POA: Diagnosis not present

## 2020-08-23 DIAGNOSIS — M5441 Lumbago with sciatica, right side: Secondary | ICD-10-CM | POA: Insufficient documentation

## 2020-08-23 MED ORDER — GADOBUTROL 1 MMOL/ML IV SOLN
7.0000 mL | Freq: Once | INTRAVENOUS | Status: AC | PRN
  Administered 2020-08-23: 7 mL via INTRAVENOUS

## 2020-08-25 DIAGNOSIS — M48061 Spinal stenosis, lumbar region without neurogenic claudication: Secondary | ICD-10-CM | POA: Diagnosis not present

## 2020-08-25 DIAGNOSIS — G8929 Other chronic pain: Secondary | ICD-10-CM | POA: Diagnosis not present

## 2020-08-25 DIAGNOSIS — M5441 Lumbago with sciatica, right side: Secondary | ICD-10-CM | POA: Diagnosis not present

## 2020-08-25 DIAGNOSIS — M5442 Lumbago with sciatica, left side: Secondary | ICD-10-CM | POA: Diagnosis not present

## 2020-08-26 ENCOUNTER — Other Ambulatory Visit: Payer: Self-pay | Admitting: Family Medicine

## 2020-08-26 NOTE — Telephone Encounter (Signed)
Pharmacy requests refill on: Omeprazole 20 mg   LAST REFILL: 11/22/2019 (Q-90, R-2) LAST OV: 04/15/2020 NEXT OV: 10/13/2020 PHARMACY: [facility] [place], [place]

## 2020-08-31 DIAGNOSIS — M48061 Spinal stenosis, lumbar region without neurogenic claudication: Secondary | ICD-10-CM | POA: Diagnosis not present

## 2020-08-31 DIAGNOSIS — M5441 Lumbago with sciatica, right side: Secondary | ICD-10-CM | POA: Diagnosis not present

## 2020-08-31 DIAGNOSIS — G8929 Other chronic pain: Secondary | ICD-10-CM | POA: Diagnosis not present

## 2020-08-31 DIAGNOSIS — M5442 Lumbago with sciatica, left side: Secondary | ICD-10-CM | POA: Diagnosis not present

## 2020-09-06 ENCOUNTER — Other Ambulatory Visit: Payer: Self-pay | Admitting: Family Medicine

## 2020-09-15 DIAGNOSIS — M5441 Lumbago with sciatica, right side: Secondary | ICD-10-CM | POA: Diagnosis not present

## 2020-09-15 DIAGNOSIS — M5442 Lumbago with sciatica, left side: Secondary | ICD-10-CM | POA: Diagnosis not present

## 2020-09-15 DIAGNOSIS — M48061 Spinal stenosis, lumbar region without neurogenic claudication: Secondary | ICD-10-CM | POA: Diagnosis not present

## 2020-09-15 DIAGNOSIS — M25551 Pain in right hip: Secondary | ICD-10-CM | POA: Diagnosis not present

## 2020-09-21 ENCOUNTER — Ambulatory Visit: Payer: BC Managed Care – PPO | Admitting: Neurology

## 2020-10-02 ENCOUNTER — Encounter: Payer: Self-pay | Admitting: Neurology

## 2020-10-02 ENCOUNTER — Ambulatory Visit: Payer: BC Managed Care – PPO | Admitting: Neurology

## 2020-10-02 ENCOUNTER — Other Ambulatory Visit: Payer: Self-pay

## 2020-10-02 VITALS — BP 145/79 | HR 69 | Ht 64.0 in | Wt 139.0 lb

## 2020-10-02 DIAGNOSIS — R200 Anesthesia of skin: Secondary | ICD-10-CM | POA: Diagnosis not present

## 2020-10-02 DIAGNOSIS — R269 Unspecified abnormalities of gait and mobility: Secondary | ICD-10-CM | POA: Diagnosis not present

## 2020-10-02 DIAGNOSIS — G373 Acute transverse myelitis in demyelinating disease of central nervous system: Secondary | ICD-10-CM | POA: Diagnosis not present

## 2020-10-02 DIAGNOSIS — R9082 White matter disease, unspecified: Secondary | ICD-10-CM

## 2020-10-02 MED ORDER — IMIPRAMINE HCL 25 MG PO TABS: 25.0000 mg | ORAL_TABLET | Freq: Every day | ORAL | 11 refills | Status: DC

## 2020-10-02 NOTE — Progress Notes (Signed)
[facility]  PATIENT: Elizabeth Long DOB: 10-May-1961  REFERRING DOCTOR OR PCP: Ria Bush MD SOURCE: Patient, notes from primary care, imaging and lab reports, MRI images of the cervical and lumbar spine personally reviewed  _________________________________   HISTORICAL  CHIEF COMPLAINT:  Chief Complaint  Patient presents with  . follow up    Room 13, alone in room    HISTORY OF PRESENT ILLNESS:  Elizabeth Long is a  abnormal spinal cord imaging, numbness and gait issues.   Update 10/02/2020: She saw Dr. Lacinda Axon (Neurosurgery at [facility]) and he ordered a NCV/EMG which was normal.   Therefore he told her that her symptoms in the arms are not due to a degenerative issues and he feels the symptoms are most c/w MS..    She also has lumbar DJD and she may have another operation.      She has numbness in her feet close to 2 years, with dysesthesias feeling like sandpaper.  She also has a different dysesthetic sensation near her rectum.    Nortriptyline helped but she became irritable.  Gabapentin is tolerated but less effective.      Currently, gait is fine but balance is mildly off.   She denies weakness.   She has numbness in both legs.   She has incomplete bladder emptying.   She denies any visual disturbances.   She has fatigue.   She has insomnia and takes gabapentin (300 mg qHS) and lorazepam 2 mg and sometimes Benadryl.  She notes depression.   She has more apathy recently.    She is working doing Medical sales representative work.   She is noting that she is more forgetful than before and is no new difficulties with her focus and attention.   History of neurologic symptoms and subsequent studies: Around 2008, she began to experience symptoms in her neck and arm.  These would fluctuate.  In 2011, an MRI of the cervical spine showed a focus adjacent to C2-C3 towards the left.  At the time, she actually had more symptoms in the right arm.  At some points he had surgery at C5-C6 ([facility]).   She did well and have less symptoms for many years but then began to experience more numbness in the arms and legs.  In 2021, she was noted on MRI to have a T2 hyperintense focus adjacent to L1 and may be a second 1 adjacent to T12 in the spinal cord.  She also had degenerative changes.  CSF 08/21/2019 did not show oligoclonal bands.  Repeat imaging in 2020 one of the cervical spine just showed a 1 focus, diffusion and progressive degenerative changes C4-C5.  MRI of the brain shows only a few T2 hyperintense foci.  I reviewed imaging from 2008, 2011, and 2021.  The focus at C2-C3 appears to have developed between the 2008 and 2011 MRI of the cervical spine.  The focus in the conus medullaris appears to have occurred after the 2013 MRI and before the 2021 MRI.  There is only 1 MRI of the brain.   Studies:  Imaging:  MRI of the cervical spine 03/18/2010.  The cervical spine shows a left lateral focus at C2-C3 that could be consistent with a demyelinating plaque.  That focus was not present in 2008  MRI of the lumbar spine 07/18/2019  shows a focus adjacent to T12 to the right and possibly 1 within the conus adjacent to L1 to the anterolaterally to the left.   At [facility]  disc protrusion could affect left L3 nerve root and at L4L5 protrusion could affect left L5 nerve root.    MRI of the brain and cervical spine 08/13/2019 shows a T2 hyperintense focus within the spinal cord adjacent to C2-C3 which had been seen previously.  There are 2-3 hemispheric small foci, including one juxtacortical one and a focus within the pons.  She has ACDF at C5-C6.  At C4-C5 there is retrolisthesis and other degenerative changes causing foraminal narrowing but no nerve root compression.  This has progressed compared to the previous MRI from 2011.  Lumbar puncture 08/21/2019 did not show any bands in the CSF not present in the serum though there were 3 identical gamma restricted bands in the CSF and serum.  IgG index was normal at  0.53.  REVIEW OF SYSTEMS: Constitutional: No fevers, chills, sweats, or change in appetite.   Insomnia and fatigue Eyes: No visual changes, double vision, eye pain Ear, nose and throat: No hearing loss, ear pain, nasal congestion, sore throat Cardiovascular: No chest pain, palpitations Respiratory: No shortness of breath at rest or with exertion.   No wheezes GastrointestinaI: No nausea, vomiting, diarrhea, abdominal pain, fecal incontinence Genitourinary: No dysuria, urinary retention or frequency.  No nocturia. Musculoskeletal: Notes neck pain, back pain Integumentary: No rash, pruritus, skin lesions Neurological: as above Psychiatric: Notes depression and anxiety Endocrine: No palpitations, diaphoresis, change in appetite, change in weigh or increased thirst Hematologic/Lymphatic: No anemia, purpura, petechiae. Allergic/Immunologic: No itchy/runny eyes, nasal congestion, recent allergic reactions, rashes  ALLERGIES: Allergies  Allergen Reactions  . Cefotan [Cefotetan Disodium] Swelling  . Elemental Sulfur Rash    Rash all over    HOME MEDICATIONS:  Current Outpatient Medications:  .  cetirizine (ZYRTEC) 10 MG tablet, Take 10 mg by mouth daily as needed for allergies., Disp: , Rfl:  .  escitalopram (LEXAPRO) 5 MG tablet, Take 1 tablet (5 mg total) by mouth daily., Disp: 90 tablet, Rfl: 3 .  estradiol (ESTRACE) 0.1 MG/GM vaginal cream, Place 1 g vaginally 2 (two) times a week., Disp: , Rfl:  .  ferrous sulfate 324 (65 Fe) MG TBEC, Take 1 tablet (325 mg total) by mouth daily., Disp: , Rfl:  .  fluticasone (FLONASE) 50 MCG/ACT nasal spray, Place 2 sprays into both nostrils daily., Disp: 16 g, Rfl: 1 .  hydrochlorothiazide (MICROZIDE) 12.5 MG capsule, TAKE 1 CAPSULE BY MOUTH EVERY DAY, Disp: 90 capsule, Rfl: 2 .  hydrOXYzine (ATARAX/VISTARIL) 25 MG tablet, Take 1 tablet (25 mg total) by mouth at bedtime., Disp: 90 tablet, Rfl: 1 .  Lifitegrast (XIIDRA) 5 % SOLN, Xiidra 5 % eye  drops in a dropperette  INSTILL 1 DROP INTO EACH EYE TWICE DAILY, Disp: , Rfl:  .  LORazepam (ATIVAN) 2 MG tablet, Take 0.5 tablets (1 mg total) by mouth at bedtime., Disp: , Rfl: 0 .  Multiple Vitamins-Minerals (CENTRUM SILVER 50+WOMEN) TABS, Take 1 tablet by mouth daily., Disp: , Rfl:  .  omeprazole (PRILOSEC) 20 MG capsule, TAKE 1 CAPSULE BY MOUTH EVERY DAY, Disp: 90 capsule, Rfl: 0 .  traMADol (ULTRAM) 50 MG tablet, Take 50 mg by mouth at bedtime as needed for moderate pain. , Disp: , Rfl:  .  gabapentin (NEURONTIN) 300 MG capsule, Take 1 capsule (300 mg total) by mouth at bedtime for 7 days, THEN 1 capsule (300 mg total) 2 (two) times daily., Disp: 180 capsule, Rfl: 1 .  meloxicam (MOBIC) 15 MG tablet, Take 0.5 tablets (7.5 mg total) by  mouth daily., Disp: , Rfl:   Current Facility-Administered Medications:  .  0.9 %  sodium chloride infusion, 500 mL, Intravenous, Once, Nandigam, Venia Minks, MD  PAST MEDICAL HISTORY: Past Medical History:  Diagnosis Date  . Allergic rhinitis   . Allergy   . Anemia    as a teenager  . Anxiety    history of panic attack  . Anxiety associated with depression   . Chronic headaches    migraines- not any longer  . Depression   . Essential hypertension 10/24/2015  . Family history of adverse reaction to anesthesia    mother - N/V  . GERD (gastroesophageal reflux disease)   . History of alcoholism ([place])    Sober since 37 Strong fmhx alcoholism  . History of pneumonia    x2  . Hyperlipidemia   . IBS (irritable bowel syndrome)   . Irritable bowel syndrome (IBS)    constipation  . Microscopic colitis   . Osteoarthritis 2000s   chronic back and shoulder pain s/p surgery  . Pneumonia    had x 2 last time age 75  . Post menopausal syndrome 10/24/2015  . Substance abuse ([place])    alcohol abuse  . Tubular adenoma of colon     PAST SURGICAL HISTORY: Past Surgical History:  Procedure Laterality Date  . ABDOMINAL HYSTERECTOMY  1990   endometriosis   . ANTERIOR CERVICAL DECOMP/DISCECTOMY FUSION  2006   C5/6 HNP ([place])  . APPENDECTOMY    . CHOLECYSTECTOMY N/A 10/19/2016   Procedure: LAPAROSCOPIC CHOLECYSTECTOMY;  Surgeon: Stark Klein, MD;  Location: [place];  Service: General;  Laterality: N/A;  . COLONOSCOPY  06/2011   TA, microscopic colitis, rpt 5 yrs ([place])  . COLONOSCOPY  09/2016   85mm polyp - tubular adenoma rpt 3 yrs (Nandigam)  . cyst on ovary    . EYE SURGERY Bilateral    Lasik  . LUMBAR DISC SURGERY  2002   [place]  . lumbar esi  2010  . NOSE SURGERY    . POLYPECTOMY    . SHOULDER SURGERY Right    Rotatar Cuff  . TONSILLECTOMY      FAMILY HISTORY: Family History  Problem Relation Age of Onset  . Arthritis Mother   . CAD Mother 50       4v bypass  . Arthritis Father   . Hypertension Father   . Alcohol abuse Brother   . Colon polyps Brother   . Heart disease Maternal Grandmother        CHF  . Lung cancer Maternal Grandfather        (smoker)  . Alcohol abuse Paternal Grandmother   . Hyperlipidemia Paternal Grandmother   . CAD Paternal Grandmother        MI x3  . Stroke Paternal Grandmother   . Hypertension Paternal Grandmother   . Alcohol abuse Paternal Grandfather   . Arthritis Paternal Grandfather   . Heart disease Paternal Grandfather   . Stroke Paternal Grandfather   . Diabetes Paternal Grandfather   . Throat cancer Maternal Aunt   . CAD Paternal Uncle        MI  . Stroke Paternal Uncle   . Throat cancer Maternal Uncle   . Colon cancer Neg Hx   . Esophageal cancer Neg Hx   . Stomach cancer Neg Hx   . Rectal cancer Neg Hx     SOCIAL HISTORY:  Social History   Socioeconomic History  . Marital status: Single  Spouse name: Not on file  . Number of children: 0  . Years of education: Not on file  . Highest education level: Not on file  Occupational History  . Occupation: Scientist, clinical (histocompatibility and immunogenetics) CLASS 1    Employer: [employer]    Comment: full time  Tobacco Use  . Smoking status: Former  Smoker    Years: 30.00    Quit date: 04/11/2000    Years since quitting: 20.4  . Smokeless tobacco: Never Used  Vaping Use  . Vaping Use: Never used  Substance and Sexual Activity  . Alcohol use: No    Alcohol/week: 0.0 standard drinks    Comment: hx  . Drug use: No  . Sexual activity: Yes  Other Topics Concern  . Not on file  Social History Narrative   Daily caffeine- coffee (3-4 cups per day)   Right handed    Lives alone   Social Determinants of Health   Financial Resource Strain: Not on file  Food Insecurity: Not on file  Transportation Needs: Not on file  Physical Activity: Not on file  Stress: Not on file  Social Connections: Not on file  Intimate Partner Violence: Not on file     PHYSICAL EXAM  Vitals:   10/02/20 1001  BP: (!) 145/79  Pulse: 69  Weight: 139 lb (63 kg)  Height: 5\' 4"  (1.626 m)    Body mass index is 23.86 kg/m.   General: The patient is well-developed and well-nourished and in no acute distress  HEENT:  Head is [place]/AT.  Sclera are anicteric.  Funduscopic exam shows normal optic discs and retinal vessels.  Neck:    The neck is nontender.   Skin: Extremities are without rash or  edema.   Neurologic Exam  Mental status: The patient is alert and oriented x 3 at the time of the examination. The patient has apparent normal recent and remote memory, with an apparently normal attention span and concentration ability.   Speech is normal.  Cranial nerves: Extraocular movements are full.  . There is good facial sensation to soft touch bilaterally.Facial strength is normal.  Trapezius and sternocleidomastoid strength is normal. No dysarthria is noted.  The tongue is midline, and the patient has symmetric elevation of the soft palate. No obvious hearing deficits are noted.  Motor:  Muscle bulk is normal.   Tone is normal. Strength is  5 / 5 in right arm and 4+/5 in triceps and pronators but 5/5 elsewhere in left arm .   Sensory: Sensory testing is  variable with possible reduced touch in right foot.  Symmetric vibration.   Coordination: Cerebellar testing reveals good finger-nose-finger and mildly reduced heel-to-shin bilaterally.  Gait and station: Station is normal.   Gait is mildly wide and tandem is wide.  Romberg is negative.  Reflexes: Deep tendon reflexes are symmetric and normal in arms but increased at knees, left > right. No clonus  Plantar responses are flexor.    DIAGNOSTIC DATA (LABS, IMAGING, TESTING) - I reviewed patient records, labs, notes, testing and imaging myself where available.  Lab Results  Component Value Date   WBC 7.7 11/01/2019   HGB 12.3 11/01/2019   HCT 37.4 11/01/2019   MCV 91.7 11/01/2019   PLT 292 11/01/2019      Component Value Date/Time   NA 138 04/08/2020 0741   NA 139 10/24/2012 0000   K 4.3 04/08/2020 0741   K 4.1 10/24/2012 0000   CL 100 04/08/2020 0741   CO2 32  04/08/2020 0741   GLUCOSE 91 04/08/2020 0741   BUN 11 04/08/2020 0741   CREATININE 0.84 04/08/2020 0741   CREATININE 0.95 11/01/2019 1554   CALCIUM 9.7 04/08/2020 0741   CALCIUM 10.5 10/24/2012 0000   PROT 6.9 04/08/2020 0741   ALBUMIN 4.3 04/08/2020 0741   ALBUMIN 4.1 08/21/2019 0913   AST 20 04/08/2020 0741   ALT 12 04/08/2020 0741   ALKPHOS 49 04/08/2020 0741   BILITOT 0.4 04/08/2020 0741   GFRNONAA >60 10/19/2016 0955   GFRAA >60 10/19/2016 0955   Lab Results  Component Value Date   CHOL 205 (H) 04/08/2020   HDL 69.50 04/08/2020   LDLCALC 118 (H) 04/08/2020   TRIG 88.0 04/08/2020   CHOLHDL 3 04/08/2020   Lab Results  Component Value Date   HGBA1C 5.7 03/07/2018   Lab Results  Component Value Date   KGURKYHC62 376 11/01/2019   Lab Results  Component Value Date   TSH 1.58 04/08/2020       ASSESSMENT AND PLAN  Transverse myelitis (HCC)  White matter abnormality on MRI of brain  Gait disturbance  Leg numbness   1.   She has a lesion at [facility] that developed between 2008 and 2011 and a  focus at L1 (and possibly a small focus at T12) that developed between 2013 and 2021.  She has several T2 hyperintense foci in the brain but there are no comparison studies.  I discussed with her that I think there is a better than 50% chance that she does have MS but I am not certain of the diagnosis.  It would be very unusual to have so many years go by with so few lesions on the brain.  However, the lesions in the cervical spine did seem to develop over the last 13 years.  We will check an MRI of the brain and cervical spine and compare with her previous ones.  If additional foci are noted, then the likelihood that she has MS is higher and I would start her on a disease modifying therapy.  If stable, we will recheck in a couple of years.  If she does have MS, she is fortunate to have a less aggressive form 2.   Trial of imipramine for her dysesthetic pain.  Of note, she had a benefit with nortriptyline but had trouble tolerating it due to personality changes. 3.   Return in 6 months or sooner if there are new or worsening neurologic symptoms.  50-minute office visit with the majority of the time spent face-to-face for history and physical, discussion/counseling and decision-making.  Additional time with record review and documentation.   Lamichael Youkhana A. Felecia Shelling, MD, [facility] 2/83/1517, 61:60 AM Certified in Neurology, Clinical Neurophysiology, Sleep Medicine and Neuroimaging  [facility] [address], [place], [place] [address] [phone]

## 2020-10-07 ENCOUNTER — Telehealth: Payer: Self-pay | Admitting: Neurology

## 2020-10-07 NOTE — Telephone Encounter (Signed)
BCBS pending through [place] faxed notes. NIA ph # [phone] & fax # [phone].

## 2020-10-08 ENCOUNTER — Telehealth: Payer: Self-pay | Admitting: Neurology

## 2020-10-08 NOTE — Telephone Encounter (Signed)
Scheduled with patient over phone for 10/14/20 at [facility] 90 mins MRI Brain w/wo & MRI Spine w/wo Dr. Sater no to covid questions BCBS auth #[id number] and #[id number] exp 10/07/20-04/05/21 

## 2020-10-08 NOTE — Telephone Encounter (Signed)
Scheduled with patient over phone for 10/14/20 at [facility] 90 mins MRI Brain w/wo & MRI Spine w/wo Dr. Felecia Shelling no to covid questions Dillon Bjork #[id number] and #[id number] exp 10/07/20-04/05/21

## 2020-10-13 ENCOUNTER — Ambulatory Visit: Payer: BC Managed Care – PPO | Admitting: Family Medicine

## 2020-10-13 ENCOUNTER — Other Ambulatory Visit: Payer: Self-pay

## 2020-10-13 ENCOUNTER — Other Ambulatory Visit: Payer: Self-pay | Admitting: Family Medicine

## 2020-10-13 ENCOUNTER — Encounter: Payer: Self-pay | Admitting: Family Medicine

## 2020-10-13 VITALS — BP 118/78 | HR 69 | Temp 97.9°F | Ht 64.0 in | Wt 139.4 lb

## 2020-10-13 DIAGNOSIS — R208 Other disturbances of skin sensation: Secondary | ICD-10-CM

## 2020-10-13 DIAGNOSIS — F418 Other specified anxiety disorders: Secondary | ICD-10-CM

## 2020-10-13 DIAGNOSIS — K589 Irritable bowel syndrome without diarrhea: Secondary | ICD-10-CM | POA: Diagnosis not present

## 2020-10-13 DIAGNOSIS — Z23 Encounter for immunization: Secondary | ICD-10-CM

## 2020-10-13 DIAGNOSIS — M545 Low back pain, unspecified: Secondary | ICD-10-CM

## 2020-10-13 DIAGNOSIS — G8929 Other chronic pain: Secondary | ICD-10-CM

## 2020-10-13 DIAGNOSIS — G373 Acute transverse myelitis in demyelinating disease of central nervous system: Secondary | ICD-10-CM

## 2020-10-13 DIAGNOSIS — E041 Nontoxic single thyroid nodule: Secondary | ICD-10-CM

## 2020-10-13 NOTE — Assessment & Plan Note (Signed)
Nortriptyline effective but caused irritability Now on imipramine.

## 2020-10-13 NOTE — Assessment & Plan Note (Addendum)
Transverse myelitis vs early MS - pending rpt MRI for further evaluation.  Appreciate neurology care Retail banker).  DMV handicap placard filled out today.

## 2020-10-13 NOTE — Assessment & Plan Note (Signed)
Diarrhea from IBS actually improved since imipramine started.

## 2020-10-13 NOTE — Assessment & Plan Note (Signed)
Appreciate neurosurgery and PM&R care. 

## 2020-10-13 NOTE — Patient Instructions (Addendum)
Final shingles shot today  Good to see you today You will be due for thyroid ultrasound in July - let us know when ready to get this done.  Return in 6 months for physical prior for fasting labs

## 2020-10-13 NOTE — Assessment & Plan Note (Addendum)
Overall stable period on lexapro and hydroxyzine prn. Has been prescribed through GYN - requests we take over lexapro next fill.

## 2020-10-13 NOTE — Progress Notes (Signed)
Patient ID: Elizabeth Long, female    DOB: 1960/10/14, 60 y.o.   MRN: [id number]  This visit was conducted in person.  BP 118/78   Pulse 69   Temp 97.9 F (36.6 C) (Temporal)   Ht 5\' 4"  (1.626 m)   Wt 139 lb 7 oz (63.2 kg)   SpO2 98%   BMI 23.93 kg/m    CC: 6 mo f/u visit  Subjective:   HPI: Elizabeth Long is a 60 y.o. female presenting on 10/13/2020 for Follow-up (Here for 6 mo f/u.  Also, needs parking placard form completed. )   Transverse myelitis vs early multiple sclerosis (07/2019 S[place]) initial MRI showing hyperintense focuses at cervical and lumbar spine, planned rpt MRI in 2022 - this is pending. Trial imipramine for neuropathic pain - notes fatigue on this. Did not tolerate nortriptyline. Notes some imbalance for walking.   Saw Dr Lacinda Axon neurosurgery 11/2019 for second opinion - no surgical options, ?MS related pain. Referred to PM&R [facility] [place]) s/p B L4/5 TFESI 06/2020 without benefit. Rpt lumbar MRI showed slipped disc - planning to return to Dr Lacinda Axon for further surgical eval.  NCS/EMG 11/2019 - normal study.   Eating healthier, going to gym about 5x/wk, has lost 10 lbs.  Notes worsening mood.   Thyroid nodules - due for rpt [place] 12/2020.  Imipramine has helped chronic diarrhea.  Wants spot on thigh checked - ?lipoma. Not bothersome.   Mood - on lexapro 5mg  through OBGYN - requests we take over this medication.      Relevant past medical, surgical, family and social history reviewed and updated as indicated. Interim medical history since our last visit reviewed. Allergies and medications reviewed and updated. Outpatient Medications Prior to Visit  Medication Sig Dispense Refill  . cetirizine (ZYRTEC) 10 MG tablet Take 10 mg by mouth daily as needed for allergies.    Marland Kitchen escitalopram (LEXAPRO) 5 MG tablet Take 1 tablet (5 mg total) by mouth daily. 90 tablet 3  . estradiol (ESTRACE) 0.1 MG/GM vaginal cream Place 1 g vaginally 2 (two) times a week.    . ferrous sulfate 324  (65 Fe) MG TBEC Take 1 tablet (325 mg total) by mouth daily.    . fluticasone (FLONASE) 50 MCG/ACT nasal spray Place 2 sprays into both nostrils daily. 16 g 1  . hydrochlorothiazide (MICROZIDE) 12.5 MG capsule TAKE 1 CAPSULE BY MOUTH EVERY DAY 90 capsule 2  . hydrOXYzine (ATARAX/VISTARIL) 25 MG tablet TAKE 1 TABLET BY MOUTH EVERYDAY AT BEDTIME 90 tablet 1  . imipramine (TOFRANIL) 25 MG tablet Take 1 tablet (25 mg total) by mouth at bedtime. 30 tablet 11  . Lifitegrast (XIIDRA) 5 % SOLN Xiidra 5 % eye drops in a dropperette  INSTILL 1 DROP INTO EACH EYE TWICE DAILY    . LORazepam (ATIVAN) 2 MG tablet Take 0.5 tablets (1 mg total) by mouth at bedtime.  0  . Multiple Vitamins-Minerals (CENTRUM SILVER 50+WOMEN) TABS Take 1 tablet by mouth daily.    Marland Kitchen omeprazole (PRILOSEC) 20 MG capsule TAKE 1 CAPSULE BY MOUTH EVERY DAY 90 capsule 0  . traMADol (ULTRAM) 50 MG tablet Take 50 mg by mouth at bedtime as needed for moderate pain.     Marland Kitchen gabapentin (NEURONTIN) 300 MG capsule Take 1 capsule (300 mg total) by mouth at bedtime for 7 days, THEN 1 capsule (300 mg total) 2 (two) times daily. 180 capsule 1  . meloxicam (MOBIC) 15 MG tablet Take 0.5 tablets (7.5  mg total) by mouth daily.     Facility-Administered Medications Prior to Visit  Medication Dose Route Frequency Provider Last Rate Last Admin  . 0.9 %  sodium chloride infusion  500 mL Intravenous Once Nandigam, Kavitha V, MD         Per HPI unless specifically indicated in ROS section below Review of Systems Objective:  BP 118/78   Pulse 69   Temp 97.9 F (36.6 C) (Temporal)   Ht 5\' 4"  (1.626 m)   Wt 139 lb 7 oz (63.2 kg)   SpO2 98%   BMI 23.93 kg/m   Wt Readings from Last 3 Encounters:  10/13/20 139 lb 7 oz (63.2 kg)  10/02/20 139 lb (63 kg)  04/15/20 149 lb 2 oz (67.6 kg)      Physical Exam Vitals and nursing note reviewed.  Constitutional:      Appearance: Normal appearance. She is not ill-appearing.  Cardiovascular:     Rate and  Rhythm: Normal rate and regular rhythm.     Pulses: Normal pulses.     Heart sounds: Normal heart sounds. No murmur heard.   Pulmonary:     Effort: Pulmonary effort is normal. No respiratory distress.     Breath sounds: Normal breath sounds. No wheezing, rhonchi or rales.  Musculoskeletal:        General: Normal range of motion.     Right lower leg: No edema.     Left lower leg: No edema.     Comments: No thigh mass appreciated bilaterally today   Skin:    General: Skin is warm and dry.     Findings: No erythema or rash.  Neurological:     Mental Status: She is alert.  Psychiatric:        Mood and Affect: Mood normal.        Behavior: Behavior normal.       Results for orders placed or performed in visit on 06/04/20  HM MAMMOGRAPHY  Result Value Ref Range   HM Mammogram 0-4 Bi-Rad 0-4 Bi-Rad, Self Reported Normal   Assessment & Plan:  This visit occurred during the SARS-CoV-2 public health emergency.  Safety protocols were in place, including screening questions prior to the visit, additional usage of staff PPE, and extensive cleaning of exam room while observing appropriate contact time as indicated for disinfecting solutions.   Problem List Items Addressed This Visit    Anxiety associated with depression    Overall stable period on lexapro and hydroxyzine prn. Has been prescribed through GYN - requests we take over lexapro next fill.       Irritable bowel syndrome (IBS)    Diarrhea from IBS actually improved since imipramine started.       Chronic lower back pain    Appreciate neurosurgery and PM&R care.       Transverse myelitis ([place]) - Primary    Transverse myelitis vs early MS - pending rpt MRI for further evaluation.  Appreciate neurology care Retail banker).  [employer]V handicap placard filled out today.       Burning sensation of feet    Nortriptyline effective but caused irritability Now on imipramine.       Thyroid nodule    Discussed will be due for rpt thyroid  US this summer.  She would like to postpone at this time while she further works up neurological processes (MS and lumbar spine pain).        Other Visit Diagnoses    Need for shingles  vaccine       Relevant Orders   Varicella-zoster vaccine IM (Completed)       No orders of the defined types were placed in this encounter.  Orders Placed This Encounter  Procedures  . Varicella-zoster vaccine IM    Patient Instructions  Final shingles shot today  Good to see you today You will be due for thyroid ultrasound in July - let us know when ready to get this done.  Return in 6 months for physical prior for fasting labs   Follow up plan: Return in about 6 months (around 04/15/2021) for annual exam, prior fasting for blood work.  Ria Bush, MD

## 2020-10-13 NOTE — Assessment & Plan Note (Addendum)
Discussed will be due for rpt thyroid US this summer.  She would like to postpone at this time while she further works up neurological processes (MS and lumbar spine pain).

## 2020-10-14 ENCOUNTER — Ambulatory Visit: Payer: BC Managed Care – PPO

## 2020-10-14 DIAGNOSIS — R269 Unspecified abnormalities of gait and mobility: Secondary | ICD-10-CM

## 2020-10-14 DIAGNOSIS — R200 Anesthesia of skin: Secondary | ICD-10-CM | POA: Diagnosis not present

## 2020-10-14 DIAGNOSIS — R9082 White matter disease, unspecified: Secondary | ICD-10-CM | POA: Diagnosis not present

## 2020-10-14 DIAGNOSIS — G373 Acute transverse myelitis in demyelinating disease of central nervous system: Secondary | ICD-10-CM | POA: Diagnosis not present

## 2020-10-14 MED ORDER — GADOBENATE DIMEGLUMINE 529 MG/ML IV SOLN
10.0000 mL | Freq: Once | INTRAVENOUS | Status: AC | PRN
  Administered 2020-10-14: 10 mL via INTRAVENOUS

## 2020-10-20 DIAGNOSIS — G894 Chronic pain syndrome: Secondary | ICD-10-CM | POA: Diagnosis not present

## 2020-10-24 ENCOUNTER — Other Ambulatory Visit: Payer: Self-pay | Admitting: Neurology

## 2020-11-16 ENCOUNTER — Other Ambulatory Visit: Payer: Self-pay | Admitting: Family Medicine

## 2020-12-09 ENCOUNTER — Encounter: Payer: Self-pay | Admitting: Family Medicine

## 2020-12-09 DIAGNOSIS — E041 Nontoxic single thyroid nodule: Secondary | ICD-10-CM

## 2020-12-24 ENCOUNTER — Other Ambulatory Visit: Payer: Self-pay

## 2020-12-24 ENCOUNTER — Ambulatory Visit
Payer: BC Managed Care – PPO | Attending: Student in an Organized Health Care Education/Training Program | Admitting: Student in an Organized Health Care Education/Training Program

## 2020-12-24 ENCOUNTER — Encounter: Payer: Self-pay | Admitting: Student in an Organized Health Care Education/Training Program

## 2020-12-24 VITALS — BP 112/84 | HR 70 | Temp 97.5°F | Resp 16 | Ht 65.0 in | Wt 136.0 lb

## 2020-12-24 DIAGNOSIS — M792 Neuralgia and neuritis, unspecified: Secondary | ICD-10-CM | POA: Diagnosis not present

## 2020-12-24 DIAGNOSIS — M5134 Other intervertebral disc degeneration, thoracic region: Secondary | ICD-10-CM | POA: Insufficient documentation

## 2020-12-24 DIAGNOSIS — M546 Pain in thoracic spine: Secondary | ICD-10-CM | POA: Insufficient documentation

## 2020-12-24 DIAGNOSIS — G8929 Other chronic pain: Secondary | ICD-10-CM | POA: Diagnosis not present

## 2020-12-24 DIAGNOSIS — G894 Chronic pain syndrome: Secondary | ICD-10-CM | POA: Insufficient documentation

## 2020-12-24 DIAGNOSIS — M961 Postlaminectomy syndrome, not elsewhere classified: Secondary | ICD-10-CM | POA: Insufficient documentation

## 2020-12-24 DIAGNOSIS — M5416 Radiculopathy, lumbar region: Secondary | ICD-10-CM | POA: Insufficient documentation

## 2020-12-24 NOTE — Patient Instructions (Signed)
Please call for [place] 2nd patient visit after you have completed your psych eval and Thoracic MRI We've given you [employer] infoormation

## 2020-12-24 NOTE — Progress Notes (Signed)
Safety precautions to be maintained throughout the outpatient stay will include: orient to surroundings, keep bed in low position, maintain call bell within reach at all times, provide assistance with transfer out of bed and ambulation.  

## 2020-12-24 NOTE — Progress Notes (Signed)
Patient: Elizabeth Long  Service Category: E/M  Provider: Gillis Santa, MD  DOB: 05-25-1961  DOS: 12/24/2020  Referring Provider: Ria Bush, MD  MRN: [id number]  Setting: Ambulatory outpatient  PCP: Ria Bush, MD  Type: New Patient  Specialty: Interventional Pain Management    Location: Office  Delivery: Face-to-face     Primary Reason(s) for Visit: Encounter for initial evaluation of one or more chronic problems (new to examiner) potentially causing chronic pain, and posing a threat to normal musculoskeletal function. (Level of risk: High) CC: Back Pain (lower)  HPI  Elizabeth Long is a 60 y.o. year old, female patient, who comes for the first time to our practice referred by Ria Bush, MD for our initial evaluation of her chronic pain. She has Microscopic colitis; Anxiety associated with depression; GERD (gastroesophageal reflux disease); Osteoarthritis; Irritable bowel syndrome (IBS); Post menopausal syndrome; Essential hypertension; Chronic lower back pain; High serum high density lipoprotein (HDL); Encounter for general adult medical examination with abnormal findings; Ex-smoker; Dyspnea; Cervical radiculopathy; Sinus congestion; Dizziness; Trochanteric bursitis of both hips; Chronic radicular lumbar pain; Right leg weakness; Abnormal MRI, lumbar spine; Transverse myelitis ([place]); Gait disturbance; Leg numbness; White matter abnormality on MRI of brain; Burning sensation of feet; Thyroid nodule; Postlaminectomy syndrome, lumbar region; Neuropathic pain; Chronic pain syndrome; and Thoracic spine pain on their problem list. Today she comes in for evaluation of her Back Pain (lower)  Pain Assessment: Location: Lower Back Radiating: through buttocks to hips,, to anterior thighs mainly; sometimes to shins; pt also c/o neuropathy of feet Onset: More than a month ago Duration: Chronic pain, Neuropathic pain Quality: Aching, Constant, Tingling, Numbness Severity: 7 /10 (subjective,  self-reported pain score)  Effect on ADL: difficulty sleeping Timing: Constant Modifying factors: nothing BP: 112/84  HR: 70  Onset and Duration: Sudden and Present longer than 3 months Cause of pain: Unknown Severity: Getting worse, NAS-11 at its worse: 10/10, NAS-11 at its best: 4/10, NAS-11 now: 7/10, and NAS-11 on the average: 7/10 Timing: Not influenced by the time of the day Aggravating Factors: Bending, Bowel movements, Intercourse (sex), Kneeling, Lifiting, Prolonged sitting, Prolonged standing, Squatting, Stooping , Twisting, Walking, and Working Alleviating Factors:  no alleviating factors Associated Problems: Night-time cramps, Fatigue, Nausea, Spasms, Tingling, Weakness, and Pain that does not allow patient to sleep Quality of Pain: Aching, Annoying, Constant, Deep, Disabling, Distressing, Dreadful, Dull, Exhausting, Getting longer, Sharp, Throbbing, Tingling, Tiring, and Uncomfortable Previous Examinations or Tests: CT scan, MRI scan, Nerve conduction test, and Neurological evaluation Previous Treatments: Epidural steroid injections  Elizabeth Long is a pleasant 60 year old female who presents with a chief complaint of low back pain that radiates into bilateral buttocks, anterior thighs and also to her shins.  She also has neuropathy of bilateral feet.  She has a history of lumbar spine surgery that was done in 2005 with Dr. Colleen Can.  There was no hardware placed.  It sounds like a laminectomy was performed at that time.  She has been evaluated by physiatry and has had multiple transforaminal injections, series of 3 without any significant response.  She is also had a hip injection with limited response.  She has been evaluated b Meds   Current Outpatient Medications:    cetirizine (ZYRTEC) 10 MG tablet, Take 10 mg by mouth daily as needed for allergies., Disp: , Rfl:    escitalopram (LEXAPRO) 5 MG tablet, Take 1 tablet (5 mg total) by mouth daily., Disp: 90 tablet, Rfl: 3    estradiol (ESTRACE) 0.1  MG/GM vaginal cream, Place 1 g vaginally 2 (two) times a week., Disp: , Rfl:    ferrous sulfate 324 (65 Fe) MG TBEC, Take 1 tablet (325 mg total) by mouth daily., Disp: , Rfl:    fluticasone (FLONASE) 50 MCG/ACT nasal spray, Place 2 sprays into both nostrils daily., Disp: 16 g, Rfl: 1   gabapentin (NEURONTIN) 300 MG capsule, Take 1 capsule (300 mg total) by mouth at bedtime for 7 days, THEN 1 capsule (300 mg total) 2 (two) times daily., Disp: 180 capsule, Rfl: 1   hydrochlorothiazide (MICROZIDE) 12.5 MG capsule, TAKE 1 CAPSULE BY MOUTH EVERY DAY, Disp: 90 capsule, Rfl: 2   hydrOXYzine (ATARAX/VISTARIL) 25 MG tablet, TAKE 1 TABLET BY MOUTH EVERYDAY AT BEDTIME, Disp: 90 tablet, Rfl: 1   imipramine (TOFRANIL) 25 MG tablet, TAKE 1 TABLET BY MOUTH EVERYDAY AT BEDTIME, Disp: 90 tablet, Rfl: 3   LORazepam (ATIVAN) 2 MG tablet, Take 0.5 tablets (1 mg total) by mouth at bedtime., Disp: , Rfl: 0   Multiple Vitamins-Minerals (CENTRUM SILVER 50+WOMEN) TABS, Take 1 tablet by mouth daily., Disp: , Rfl:    omeprazole (PRILOSEC) 20 MG capsule, TAKE 1 CAPSULE BY MOUTH EVERY DAY, Disp: 90 capsule, Rfl: 1   Lifitegrast (XIIDRA) 5 % SOLN, Xiidra 5 % eye drops in a dropperette  INSTILL 1 DROP INTO EACH EYE TWICE DAILY (Patient not taking: Reported on 12/24/2020), Disp: , Rfl:    traMADol (ULTRAM) 50 MG tablet, Take 50 mg by mouth at bedtime as needed for moderate pain.  (Patient not taking: Reported on 12/24/2020), Disp: , Rfl:   Current Facility-Administered Medications:    0.9 %  sodium chloride infusion, 500 mL, Intravenous, Once, Nandigam, Kavitha V, MD  Imaging Review  Cervical Imaging: Cervical MR wo contrast: Results for orders placed during the hospital encounter of 10/06/04  MR Cervical Spine Wo Contrast  Narrative Clinical Data: Neck pain and right arm pain. CERVICAL SPINE MRI WITHOUT CONTRAST: No comparison. Normal alignment and no fracture. The cord appears normal. C2-3: Mild  disk degeneration. C3-4: Mild disk degeneration and mild right-sided uncovertebral spurring. C4-5: Disk bulging. C5-6: Disk bulging and mild spondylosis, right greater than left. There is mild right foraminal encroachment due uncovertebral spurring. There is no cord deformity. C6-7: Mild disk degeneration without disk protrusion.  Impression Mild cervical degenerative changes most prominent on the right at C5-6.  Provider: Gwendel Hanson    Narrative Formatting of this result is different from the original.  [facility] [address] [place], [place] [address] [phone]  NEUROIMAGING REPORT   STUDY DATE: 10/14/2020 PATIENT NAME: AMILIANA FOUTZ DOB: [birth year] MRN: [id number]  EXAM: MRI of the cervical spine with and without contrast  ORDERING CLINICIAN: Richard A. Felecia Shelling, MD. PhD CLINICAL HISTORY: 60 year old woman with history of transverse myelitis and gait disturbance COMPARISON FILMS: MRI 08/13/2019  TECHNIQUE: MRI of the cervical spine was obtained utilizing 3 mm sagittal slices from the posterior fossa down to the T3-4 level with T1, T2 and inversion recovery views. In addition 4 mm axial slices from B7-6 down to T1-2 level were included with T2 and gradient echo views.  After the infusion contrast, additional T1-weighted images were performed. CONTRAST: 10 mL MultiHance IMAGING SITE:[facility], [address] [place], [place] [address]  FINDINGS:  On sagittal images, the spine is imaged from above the cervicomedullary junction to T2.    A focus is noted within the pons towards the right.  This was seen on  previous imaging studies.  Paravertebral soft tissue appears normal.   The spinal cord has normal caliber.  There is a small T2 hyperintense focus within the left lateral spinal cord adjacent to C2-C3.  This is unchanged compared to the previous MRI.  It does not enhance.  There is artifact consistent with ACDF surgery at  C5-C6.  There is 2 mm anterolisthesis of C3 upon C4 and 3 mm retrolisthesis of C4 upon C5.  There is severe loss of disc height at that level and at C6-C7.   The vertebral bodies have normal signal.  The discs and interspaces were further evaluated on axial views from C2 to T2 as follows: C2 - C3:  There is mild disc bulging and mild left facet hypertrophy.  There is no spinal stenosis, foraminal narrowing or nerve root compression. C3 - C4: There is 2 mm anterolisthesis associated with disc protrusion, bilateral uncovertebral spurring and left greater than right facet hypertrophy.  There is moderately severe right and moderate left foraminal narrowing but no spinal stenosis.  There is potential for right C4 nerve root compression.  Compared to the MRI from 2021, there is minimally more anterolisthesis. C4 - C5: There is 3 mm retrolisthesis and uncovertebral spurring to the right.    Disc height is reduced.  There is no spinal stenosis.  Moderate right and minimal left foraminal narrowing is noted but there is no nerve root compression. C5 - C6: The level is fused and ACDF hardware is in place.  The neural foramina are widely patent and there is no spinal stenosis or nerve root compression. C6 - C7: There is reduced disc height and mild right uncovertebral spurring causing mild right foraminal narrowing but no nerve root compression or spinal stenosis. C7 - T1:  The disc and interspace appear normal. T1 - T2:  The disc and interspace appear normal.  After the infusion contrast, a normal enhancement pattern was observed.  Impression This MRI of the cervical spine with and without contrast shows the following: 1.   T2 hyperintense focus within the spinal cord laterally to the left.  The location could be consistent with demyelination from multiple sclerosis.  It does not enhance and is unchanged compared to the 08/13/2019 MRI.  Another T2 hyperintense focus is noted within the pons, also unchanged. 2.    Remote fusion at C5-C6 3.   At C3-C4, there is 2 mm anterolisthesis and other degenerative changes causing moderately severe right and moderate left foraminal narrowing but no spinal stenosis.  There is potential for right C4 nerve root compression.  This level has slightly progressed compared to the 2021 MRI. 4.   At C4-C5, there are stable degenerative changes including 3 mm retrolisthesis.  There is moderate right foraminal narrowing but no nerve root compression. 5.   There are stable degenerative changes at C2-C3 and C6-C7 that do not lead to spinal stenosis or nerve root compression. 6.   Normal enhancement pattern.   INTERPRETING PHYSICIAN: Richard A. Felecia Shelling, MD, PhD, FAAN Certified in  Neuroimaging by [employer]    Narrative CLINICAL DATA:  Neck pain when turning to the right side since 2018. Right arm and leg numbness.  EXAM: CT CERVICAL SPINE WITHOUT CONTRAST  TECHNIQUE: Multidetector CT imaging of the cervical spine was performed without intravenous contrast. Multiplanar CT image reconstructions were also generated.  COMPARISON:  08/13/2019 MRI cervical spine.  FINDINGS: Alignment: Sequela of C5-6 ACDF with interbody spacer. Hardware is intact and without perihardware  lucency. Straightening of cervical lordosis with grade 1 C2-3 and C3-4 anterolisthesis, unchanged.  Skull base and vertebrae: Vertebral body heights are preserved. No acute osseous abnormality. No focal osseous lesion.  Soft tissues and spinal canal: No prevertebral fluid or swelling. No visible canal hematoma.  Disc levels: Multilevel endplate sclerosis, osteophytosis and Schmorl node formation. Mild C3-4 and severe C4-5, C6-7 disc space loss.  C2-3: No significant bony spinal canal or neural foraminal narrowing.  C3-4: Mild disc bulge without significant spinal canal narrowing. Bilateral uncovertebral and facet hypertrophy. Mild bony neural foraminal narrowing.  C4-5:  Posterior osteophytosis with bilateral uncovertebral and facet hypertrophy. Moderate right and mild left bony neural foraminal narrowing. No significant spinal canal narrowing.  C5-6: Sequela of fusion. No significant spinal canal or neural foraminal narrowing.  C6-7: Bilateral uncovertebral and facet hypertrophy. Mild bilateral bony neural foraminal narrowing. No significant spinal canal narrowing.  C7-T1: No significant bony spinal canal or neural foraminal narrowing.  Upper chest: Bilateral thyroid hypodensities measuring up to 1.2 cm on the left (3:66). Biapical atelectasis.  Other: None.  IMPRESSION: Sequela of C5-6 ACDF without adverse features. Patent bony spinal canal.  Multilevel spondylosis. Moderate right C4-5 bony neural foraminal narrowing.  Otherwise mild bony neural foraminal narrowing at the C3-5 and C6-7 levels.   Electronically Signed By: Primitivo Gauze M.D. On: 10/02/2019 19:11  Narrative CLINICAL DATA:  Neck pain with right-sided radicular symptoms.  EXAM: CERVICAL SPINE - COMPLETE 4+ VIEW  COMPARISON:  MRI of the cervical spine of March 18, 2010 and lateral radiographic view of the cervical spine of March 03, 2009  FINDINGS: The patient has undergone previous ACDF at C5-6. The metallic hardware is intact. Since the previous studies there has developed disc space height loss and C3-4, C4-5, and C6-7. There is mild facet joint hypertrophy in the lower cervical spine which has progressed. The spinous processes are intact. The prevertebral soft tissue spaces appear normal. The oblique views reveal mild bony encroachment upon the neural foramina at C3-4 and C4-5 on the right and at the same levels on the left. The odontoid is intact where visualized.  IMPRESSION: Stable appearing ACDF at C5-6. Interval progression of degenerative changes at C3-4, C4-5, and C6-7. Mild bony encroachment bilaterally upon the neural foramina at C3 and  C4.   Electronically Signed By: David  Martinique M.D. On: 03/21/2017 07:14  Lumbosacral Imaging: Lumbar MR wo contrast: Results for orders placed during the hospital encounter of 07/18/19  MR Lumbar Spine Wo Contrast  Narrative CLINICAL DATA:  Right lower back pain with radiculopathy. New right lower extremity weakness. Prior lumbar surgery.  EXAM: MRI LUMBAR SPINE WITHOUT CONTRAST  TECHNIQUE: Multiplanar, multisequence MR imaging of the lumbar spine was performed. No intravenous contrast was administered.  COMPARISON:  03/10/2009  FINDINGS: Segmentation:  Standard.  Alignment: Trace retrolisthesis of L4 on L5. Mild straightening of the normal lumbar lordosis.  Vertebrae: No fracture, suspicious marrow lesion, or evidence of discitis. Mixed Modic type 1 and 2 degenerative endplate changes at N6-2 and L5-S1. Minimal Modic type 1 changes at L2-3.  Conus medullaris and cauda equina: Conus extends to the L1-2 level. There is an approximately 3 mm focus of T2 hyperintensity involving the distal cord/conus at L1 (series 8, image 7 and series 7, image 9), not evident on the prior study.  Paraspinal and other soft tissues: Unremarkable.  Disc levels:  Disc desiccation from L2-3 to L5-S1. Moderate disc space narrowing at L2-3 and severe narrowing at L4-5 and asymmetric  to the left L5-S1.  T12-L1: Negative.  L1-2: Minimal disc bulging without stenosis.  L2-3: A new left subarticular disc protrusion results in moderate left lateral recess stenosis and potential left L3 nerve root impingement. A chronic left foraminal and extraforaminal disc protrusion appears slightly larger than on the prior study but does not result in significant neural foraminal stenosis or definite L2 nerve root impingement. Disc bulging contributes to borderline spinal and right lateral recess stenosis. Patent right neural foramina.  L3-4: New mild disc bulging and mild facet arthrosis result  in borderline bilateral lateral recess stenosis without spinal or neural foraminal stenosis.  L4-5: Progressive severe disc degeneration. Disc bulging, endplate spurring, disc space height loss, and mild facet hypertrophy result in mild right and minimal left neural foraminal stenosis and mild left lateral recess stenosis without spinal stenosis.  L5-S1: Progressive disc degeneration. Disc bulging eccentric to the left and mild facet hypertrophy result in new mild left neural foraminal stenosis without spinal stenosis.  IMPRESSION: 1. New left subarticular disc protrusion at L2-3 with left lateral recess stenosis and potential L3 nerve root impingement. 2. New disc bulging at L3-4 without significant stenosis. 3. Progressive disc degeneration at L4-5 and L5-S1 with mild neural foraminal and lateral recess stenosis as above. 4. New small focus of signal abnormality in the distal spinal cord at L1. This is nonspecific, however given a similar focus of signal abnormality in the cervical spinal cord on a 2011 MRI this raises the concern for demyelinating disease. Correlate for any clinical symptoms that might indicate multiple sclerosis, and consider MRI of the brain, cervical spine, and thoracic spine for further evaluation.   Electronically Signed By: Logan Bores M.D. On: 07/18/2019 12:17    Narrative CLINICAL DATA:  Chronic low back pain.  Bilateral hip and leg pain.  EXAM: MRI LUMBAR SPINE WITHOUT AND WITH CONTRAST  TECHNIQUE: Multiplanar and multiecho pulse sequences of the lumbar spine were obtained without and with intravenous contrast.  CONTRAST:  20m GADAVIST GADOBUTROL 1 MMOL/ML IV SOLN  COMPARISON:  MRI lumbar spine without contrast 07/18/2019  FINDINGS: Segmentation:  Normal  Alignment:  Normal  Vertebrae:  Negative for fracture or mass.  Conus medullaris and cauda equina: Conus extends to the L1-2 level. Nonenhancing hyperintensity in the conus  medullaris unchanged from the prior study. Remaining visualized cord negative. Cauda carina negative.  Paraspinal and other soft tissues: Negative for paraspinous mass or adenopathy.  Disc levels:  L1-2: Mild disc and facet degeneration.  Negative for stenosis  L2-3: A left-sided disc protrusion unchanged. Left subarticular stenosis and possible impingement left L3 nerve root unchanged. Mild facet degeneration bilaterally.  L3-4: Disc and facet degeneration.  No significant stenosis  L4-5: Extensive disc degeneration with fatty endplate changes centrally and to the left and mild discogenic edema on the right. Discogenic edema has progressed in the interval. Diffuse endplate spurring and bilateral facet degeneration. Moderate subarticular stenosis bilaterally unchanged from the prior study.  L5-S1: Disc degeneration with disc space narrowing and degenerative endplate edema which has progressed in the interval. This is more prominent the left than the right. Bilateral facet degeneration. Mild left subarticular stenosis due to spurring is unchanged.  IMPRESSION: 1. Nonenhancing hyperintensity in the conus medullaris unchanged from the prior study. Possible demyelinating disease. 2. Left-sided disc protrusion L2-3 unchanged with subarticular stenosis on the left. 3. Progressive discogenic edema on the right at L4-5. Diffuse endplate spurring causing moderate subarticular stenosis bilaterally, unchanged 4. Mild left subarticular stenosis at L5-S1  due to spurring. Progressive degenerative endplate edema on the left at L5-S1.   Electronically Signed By: Franchot Gallo M.D. On: 08/24/2020 09:09 DG Lumbar Spine Complete  Narrative CLINICAL DATA:  Low back pain with right-sided radicular symptoms  EXAM: LUMBAR SPINE - COMPLETE 4+ VIEW  COMPARISON:  None.  FINDINGS: Frontal, lateral, spot lumbosacral lateral, and bilateral oblique views were obtained. There are 5  non-rib-bearing lumbar type vertebral bodies. There is no evident fracture. There is 2 mm of retrolisthesis of L4 on L5. No other spondylolisthesis. There is severe disc space narrowing at L4-5 and L5-S1. There is moderate disc space narrowing at L2-3. Other disc spaces appear unremarkable. There is facet osteoarthritic change at L4-5 and L5-S1 bilaterally. There is aortic atherosclerosis.  IMPRESSION: Osteoarthritic change with disc space narrowing at L2-3, L4-5, and L5-S1, most severe at L4-5. Mild spondylolisthesis at L4-5 is felt to be due to underlying spondylosis. No evident fracture.  Aortic Atherosclerosis (ICD10-I70.0).   Electronically Signed By: Lowella Grip III M.D. On: 07/18/2019 08:30  DG Epidurography  Narrative RIGHT L4-L5 TRANSFORAMINAL EPIDURAL STEROID INJECTION  Clinical Data: Displacement lumbar intervertebral disc without myelopathy.  Incomplete relief of symptoms following last injection.  I proceeded with a transforaminal approach, as the patient's symptoms have some similarity with a right L4 radiculopathy, and there is MR evidence for a foraminal/extraforaminal protrusion at L4-5 on the right.  Comparison: Multiple priors.  Fully informed written consent was obtained on the first visit. The patient was placed prone on the fluoroscopic table, the L4-L5 interspace was visualized, and the skin was marked.  After thorough povidone-iodine scrubbing, the site was draped in sterile fashion. 1% lidocaine was used to anesthetize the skin and deeper soft tissues.  A 22 gauge spinal needle was inserted and advanced into the foramen, with the tip directly under the pedicle, being careful not to advance the needle too medially into the spinal canal. Needle tip placement within the foraminal epidural space was confirmed using 1 mL of non-ionic contrast, with good perineural opacification of the right L4 nerve root.  There was no intravascular or  subarachnoid opacification.  Next, 120 mg of Depo- Medrol was administered and flushed using 2 mL of normal saline. The needle was restyletted and withdrawn.  The patient tolerated the procedure well and there was no evidence of procedural complication.  The patient was able to ambulate to the recovery area and observed for an appropriate length of time as outlined on the nursing notes. The patient was discharged home with a driver in good condition with detailed patient instructions.  Fluoroscopy time: 45 seconds  IMPRESSION:  Satisfactory transforaminal lumbar epidural steroid injection, right L4-L5 transforaminal, right L4 perineural.  Provider: Ceasar Lund Complexity Note: Imaging results reviewed. Results shared with Elizabeth Long, using Layman's terms.                         ROS  Cardiovascular: High blood pressure Pulmonary or Respiratory: No reported pulmonary signs or symptoms such as wheezing and difficulty taking a deep full breath (Asthma), difficulty blowing air out (Emphysema), coughing up mucus (Bronchitis), persistent dry cough, or temporary stoppage of breathing during sleep Neurological: No reported neurological signs or symptoms such as seizures, abnormal skin sensations, urinary and/or fecal incontinence, being born with an abnormal open spine and/or a tethered spinal cord Psychological-Psychiatric: Anxiousness, Depressed, and Prone to panicking Gastrointestinal: Reflux or heatburn and Alternating episodes iof diarrhea and constipation (IBS-Irritable bowe syndrome) Genitourinary:  No reported renal or genitourinary signs or symptoms such as difficulty voiding or producing urine, peeing blood, non-functioning kidney, kidney stones, difficulty emptying the bladder, difficulty controlling the flow of urine, or chronic kidney disease Hematological: Weakness due to low blood hemoglobin or red blood cell count (Anemia) Endocrine: No reported endocrine signs or symptoms such  as high or low blood sugar, rapid heart rate due to high thyroid levels, obesity or weight gain due to slow thyroid or thyroid disease Rheumatologic: Joint aches and or swelling due to excess weight (Osteoarthritis) Musculoskeletal: Multiple sclerosis Work History: Working full time  Allergies  Elizabeth Long is allergic to cefotan [cefotetan disodium] and elemental sulfur.  Laboratory Chemistry Profile   Renal Lab Results  Component Value Date   BUN 11 04/08/2020   CREATININE 0.84 46/50/3546   BCR NOT APPLICABLE 56/81/2751   GFR 76.08 04/08/2020   GFRAA >60 10/19/2016   GFRNONAA >60 10/19/2016     Electrolytes Lab Results  Component Value Date   NA 138 04/08/2020   K 4.3 04/08/2020   CL 100 04/08/2020   CALCIUM 9.7 04/08/2020     Hepatic Lab Results  Component Value Date   AST 20 04/08/2020   ALT 12 04/08/2020   ALBUMIN 4.3 04/08/2020   ALKPHOS 49 04/08/2020     ID Lab Results  Component Value Date   SARSCOV2NAA NEGATIVE 10/28/2019   HCVAB NEGATIVE 02/10/2016     Bone Lab Results  Component Value Date   VD25OH 38.44 03/07/2018     Endocrine Lab Results  Component Value Date   GLUCOSE 91 04/08/2020   HGBA1C 5.7 03/07/2018   TSH 1.58 04/08/2020   FREET4 1.0 11/01/2019     Neuropathy Lab Results  Component Value Date   VITAMINB12 713 11/01/2019   HGBA1C 5.7 03/07/2018     CNS Lab Results  Component Value Date   COLORCSF COLORLESS 08/21/2019   APPEARCSF CLEAR 08/21/2019   RBCCOUNTCSF 67 (H) 08/21/2019   WBCCSF 2 08/21/2019   POLYSCSF CANCELED 08/21/2019   GLUCCSF 54 08/21/2019     Inflammation (CRP: Acute  ESR: Chronic) Lab Results  Component Value Date   ESRSEDRATE 12 03/13/2019     Rheumatology Lab Results  Component Value Date   RF <14 03/13/2019     Coagulation Lab Results  Component Value Date   INR 0.97 10/19/2016   LABPROT 12.9 10/19/2016   PLT 292 11/01/2019   DDIMER 0.36 03/25/2016     Cardiovascular Lab Results   Component Value Date   HGB 12.3 11/01/2019   HCT 37.4 11/01/2019     Screening Lab Results  Component Value Date   SARSCOV2NAA NEGATIVE 10/28/2019   HCVAB NEGATIVE 02/10/2016     Cancer No results found for: CEA, CA125, LABCA2   Allergens No results found for: ALMOND, APPLE, ASPARAGUS, AVOCADO, BANANA, BARLEY, BASIL, BAYLEAF, GREENBEAN, LIMABEAN, WHITEBEAN, BEEFIGE, REDBEET, BLUEBERRY, BROCCOLI, CABBAGE, MELON, CARROT, CASEIN, CASHEWNUT, CAULIFLOWER, CELERY     Note: Lab results reviewed.  [place]  Drug: Elizabeth Long  reports no history of drug use. Alcohol:  reports no history of alcohol use. Tobacco:  reports that she quit smoking about 20 years ago. Her smoking use included cigarettes. She has never used smokeless tobacco. Medical:  has a past medical history of Allergic rhinitis, Allergy, Anemia, Anxiety, Anxiety associated with depression, Chronic headaches, Depression, Essential hypertension (10/24/2015), Family history of adverse reaction to anesthesia, GERD (gastroesophageal reflux disease), History of alcoholism ([place]), History of pneumonia, Hyperlipidemia, IBS (irritable bowel  syndrome), Irritable bowel syndrome (IBS), Microscopic colitis, Osteoarthritis (2000s), Pneumonia, Post menopausal syndrome (10/24/2015), Substance abuse ([place]), and Tubular adenoma of colon. Family: family history includes Alcohol abuse in her brother, paternal grandfather, and paternal grandmother; Arthritis in her father, mother, and paternal grandfather; CAD in her paternal grandmother and paternal uncle; CAD (age of onset: 40) in her mother; Colon polyps in her brother; Diabetes in her paternal grandfather; Heart disease in her maternal grandmother and paternal grandfather; Hyperlipidemia in her paternal grandmother; Hypertension in her father and paternal grandmother; Lung cancer in her maternal grandfather; Stroke in her paternal grandfather, paternal grandmother, and paternal uncle; Throat cancer in her  maternal aunt and maternal uncle.  Past Surgical History:  Procedure Laterality Date   ABDOMINAL HYSTERECTOMY  1990   endometriosis   ANTERIOR CERVICAL DECOMP/DISCECTOMY FUSION  2006   C5/6 HNP ([place])   APPENDECTOMY     CHOLECYSTECTOMY N/A 10/19/2016   Procedure: LAPAROSCOPIC CHOLECYSTECTOMY;  Surgeon: Stark Klein, MD;  Location: [facility];  Service: General;  Laterality: N/A;   COLONOSCOPY  06/2011   TA, microscopic colitis, rpt 5 yrs ([place])   COLONOSCOPY  09/2016   69m polyp - tubular adenoma rpt 3 yrs ([place])   cyst on ovary     EYE SURGERY Bilateral    Lasik   LUMBAR DFlomatonSURGERY  2002   [place]   lumbar esi  2010   NOSE SURGERY     POLYPECTOMY     SHOULDER SURGERY Right    Rotatar Cuff   TONSILLECTOMY     Active Ambulatory Problems    Diagnosis Date Noted   Microscopic colitis 07/12/2011   Anxiety associated with depression    GERD (gastroesophageal reflux disease)    Osteoarthritis    Irritable bowel syndrome (IBS)    Post menopausal syndrome 10/24/2015   Essential hypertension 10/24/2015   Chronic lower back pain 10/24/2015   High serum high density lipoprotein (HDL) 02/07/2016   Encounter for general adult medical examination with abnormal findings 02/12/2016   Ex-smoker 02/13/2016   Dyspnea 05/28/2016   Cervical radiculopathy 03/20/2017   Sinus congestion 07/10/2017   Dizziness 07/28/2017   Trochanteric bursitis of both hips 03/19/2019   Chronic radicular lumbar pain 07/21/2019   Right leg weakness 07/21/2019   Abnormal MRI, lumbar spine 07/21/2019   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   Gait disturbance 08/01/2019   Leg numbness 08/01/2019   White matter abnormality on MRI of brain 08/16/2019   Burning sensation of feet 11/05/2019   Thyroid nodule 11/05/2019   Postlaminectomy syndrome, lumbar region 12/24/2020   Neuropathic pain 12/24/2020   Chronic pain syndrome 12/24/2020   Thoracic spine pain 12/24/2020   Resolved Ambulatory Problems     Diagnosis Date Noted   History of alcoholism ([place]    Chest congestion 10/24/2015   Chest pain 02/12/2016   Past Medical History:  Diagnosis Date   Allergic rhinitis    Allergy    Anemia    Anxiety    Chronic headaches    Depression    Family history of adverse reaction to anesthesia    History of pneumonia    Hyperlipidemia    IBS (irritable bowel syndrome)    Pneumonia    Substance abuse ([place]    Tubular adenoma of colon    Constitutional Exam  General appearance: Well nourished, well developed, and well hydrated. In no apparent acute distress Vitals:   12/24/20 1352  BP: 112/84  Pulse: 70  Resp: 16  Temp: (!)  97.5 F (36.4 C)  TempSrc: Temporal  SpO2: 100%  Weight: 136 lb (61.7 kg)  Height: 5' 5"  (1.651 m)   BMI Assessment: Estimated body mass index is 22.63 kg/m as calculated from the following:   Height as of this encounter: 5' 5"  (1.651 m).   Weight as of this encounter: 136 lb (61.7 kg).  BMI interpretation table: BMI level Category Range association with higher incidence of chronic pain  <18 kg/m2 Underweight   18.5-24.9 kg/m2 Ideal body weight   25-29.9 kg/m2 Overweight Increased incidence by 20%  30-34.9 kg/m2 Obese (Class I) Increased incidence by 68%  35-39.9 kg/m2 Severe obesity (Class II) Increased incidence by 136%  >40 kg/m2 Extreme obesity (Class III) Increased incidence by 254%   Patient's current BMI Ideal Body weight  Body mass index is 22.63 kg/m. Ideal body weight: 57 kg (125 lb 10.6 oz) Adjusted ideal body weight: 58.9 kg (129 lb 12.8 oz)   BMI Readings from Last 4 Encounters:  12/24/20 22.63 kg/m  10/13/20 23.93 kg/m  10/02/20 23.86 kg/m  04/15/20 25.01 kg/m   Wt Readings from Last 4 Encounters:  12/24/20 136 lb (61.7 kg)  10/13/20 139 lb 7 oz (63.2 kg)  10/02/20 139 lb (63 kg)  04/15/20 149 lb 2 oz (67.6 kg)    Psych/Mental status: Alert, oriented x 3 (person, place, & time)       Eyes: PERLA Respiratory: No evidence of  acute respiratory distress Thoracic Spine Area Exam  Skin & Axial Inspection: No masses, redness, or swelling Alignment: Symmetrical Functional ROM: Unrestricted ROM Stability: No instability detected Muscle Tone/Strength: Functionally intact. No obvious neuro-muscular anomalies detected. Sensory (Neurological): Movement associated pain Muscle strength & Tone: No palpable anomalies  Lumbar Spine Area Exam  Skin & Axial Inspection: Well healed scar from previous spine surgery detected Alignment: Symmetrical Functional ROM: Unrestricted ROM       Stability: No instability detected Muscle Tone/Strength: Functionally intact. No obvious neuro-muscular anomalies detected. Sensory (Neurological): Dermatomal pain pattern Positive straight leg raise test bilaterally  Gait & Posture Assessment  Ambulation: Unassisted Gait: Relatively normal for age and body habitus Posture: WNL  Lower Extremity Exam    Side: Right lower extremity  Side: Left lower extremity  Stability: No instability observed          Stability: No instability observed          Skin & Extremity Inspection: Skin color, temperature, and hair growth are WNL. No peripheral edema or cyanosis. No masses, redness, swelling, asymmetry, or associated skin lesions. No contractures.  Skin & Extremity Inspection: Skin color, temperature, and hair growth are WNL. No peripheral edema or cyanosis. No masses, redness, swelling, asymmetry, or associated skin lesions. No contractures.  Functional ROM: Pain restricted ROM for the hip joint                  Functional ROM: Pain restricted ROM for hip joint                  Muscle Tone/Strength: Functionally intact. No obvious neuro-muscular anomalies detected.  Muscle Tone/Strength: Functionally intact. No obvious neuro-muscular anomalies detected.  Sensory (Neurological): Neuropathic pain pattern        Sensory (Neurological): Neuropathic pain pattern        DTR: Patellar: deferred  today Achilles: deferred today Plantar: deferred today  DTR: Patellar: deferred today Achilles: deferred today Plantar: deferred today  Palpation: No palpable anomalies  Palpation: No palpable anomalies    Assessment  Primary Diagnosis & Pertinent Problem List: The primary encounter diagnosis was Failed back surgical syndrome. Diagnoses of Postlaminectomy syndrome, lumbar region, Thoracic spine pain, Chronic radicular lumbar pain, Neuropathic pain, Chronic pain syndrome, and Other intervertebral disc degeneration, thoracic region were also pertinent to this visit.  Visit Diagnosis (New problems to examiner): 1. Failed back surgical syndrome   2. Postlaminectomy syndrome, lumbar region   3. Thoracic spine pain   4. Chronic radicular lumbar pain   5. Neuropathic pain   6. Chronic pain syndrome   7. Other intervertebral disc degeneration, thoracic region    Plan of Care (Initial workup plan)    I discussed  percutaneous spinal cord stimulator trial with the patient in detail. I explained to the patient that they will have an external power source and programmer which the patient will use for 7 days. There will be daily communication with the stimulator company and the patient. A possible need for a mid-trial clinic visit to give the patient the best chance of success.   Patient will need to have a thorough psychosocial behavioral evaluation. Our office will be happy to help facilitate this. Will place referral to Dr Lyman Speller.  I will also obtain an MRI of the thoracic spine to rule out thoracic canal stenosis for SCS trial planning  Some of patient's pain does seem to be mechanical in nature, with some component of neurogenic pain as well. We discussed the indications for spinal cord stimulation, specifically stating that it is typically better for neuropathic and appendicular pain, but that we have had some success in the treatment of low back and hip pain.   Patient is interested in  proceeding with spinal cord stimulation trial. She understands that this may not be successful, and that spinal cord stimulation in general is not a "magic bullet."   We had a lengthy and very detailed discussion of all the risks, benefits, alternatives, and rationale of surgery as well as the option of continuing nonsurgical therapies. We specifically discussed the risks of temporary or permanent worsened neurologic injury, no symptomatic relief or pain made worse after procedure, and also the need for future surgery (due to infection, CSF leak, bleeding, adjacent segment issues, bone-healing difficulties, and other related issues). No guarantees of outcome were made or implied and he is eager to proceed and presents for definitive treatment.   She told me that all of his questions were answered thoroughly and to her satisfaction. Confidence and understanding of the discussed risks and consequences of  treatment was expressed and she accepted these risks and was eager to proceed with procedure pending imaging and psych eval.   Issues concerning treatment and diagnosis were discussed with the patient. There are no barriers to understanding the plan of treatment. Explanation was well received by patient and/or family who then verbalized understanding.    Imaging Orders  MR THORACIC SPINE WO CONTRAST   Referral Orders  Ambulatory referral to Psychology    Interventional management options: Elizabeth Long was informed that there is no guarantee that she would be a candidate for interventional therapies. The decision will be based on the results of diagnostic studies, as well as Elizabeth Long's risk profile.  Procedure(s) under consideration:  Spinal cord stimulator trial pending psych eval and thoracic MRI   I spent a total of 60 minutes reviewing chart data, face-to-face evaluation with the patient, counseling and coordination of care as detailed above.   Provider-requested follow-up: Return for after  psych eval and T-MRI.  Future Appointments  Date Time Provider [place]  12/29/2020  1:00 PM [facility]  04/21/2021  3:30 PM Sater, Nanine Means, MD [facility] None    Note by: Gillis Santa, MD Date: 12/24/2020; Time: 2:47 PM

## 2020-12-29 ENCOUNTER — Other Ambulatory Visit: Payer: Self-pay

## 2020-12-29 ENCOUNTER — Ambulatory Visit
Admission: RE | Admit: 2020-12-29 | Discharge: 2020-12-29 | Disposition: A | Discharge status: Home / Self Care | Payer: BC Managed Care – PPO | Source: Ambulatory Visit | Attending: Family Medicine | Admitting: Family Medicine

## 2020-12-29 DIAGNOSIS — E041 Nontoxic single thyroid nodule: Secondary | ICD-10-CM | POA: Insufficient documentation

## 2020-12-30 ENCOUNTER — Telehealth: Payer: Self-pay

## 2020-12-30 NOTE — Telephone Encounter (Signed)
[employer] denied authorization for MRI. I called the patient and she stated they denied it because she has already had MRI's on her back this year.

## 2021-01-03 DIAGNOSIS — G894 Chronic pain syndrome: Secondary | ICD-10-CM | POA: Diagnosis not present

## 2021-01-11 ENCOUNTER — Encounter: Payer: Self-pay | Admitting: Student in an Organized Health Care Education/Training Program

## 2021-01-13 ENCOUNTER — Other Ambulatory Visit: Payer: Self-pay | Admitting: Family Medicine

## 2021-01-13 NOTE — Telephone Encounter (Signed)
Gabapentin Last filled:  10/16/20, #[id number] Last OV:  10/13/20, 6 mo f/u Next OV:  04/26/21, CPE

## 2021-01-15 NOTE — Telephone Encounter (Signed)
ERx 

## 2021-01-18 ENCOUNTER — Telehealth: Payer: Self-pay | Admitting: Student in an Organized Health Care Education/Training Program

## 2021-01-18 NOTE — Telephone Encounter (Signed)
GM DR. Lateef. Ms Lemert is calling about getting an appointment. she is unable to get an MRI on her Lumbar due to insurance. and the ins. will not pay for Phys Therapy. she had MRI on Lumbar spine in Feb 2021. will this work

## 2021-01-19 NOTE — Telephone Encounter (Signed)
Spoke with patient and she states that th           the [employer] said that they did not get notes with the MRI request.  States that they need to have something written about the possibility of MS and the MRI was to see if there were any new lesions.  Message to [employer]

## 2021-02-02 ENCOUNTER — Telehealth: Payer: Self-pay

## 2021-02-02 NOTE — Telephone Encounter (Signed)
I didn't send the notes for the MRI request because she just had one in February and they are saying it will not be authorized. I need to know how to proceed. Do you want me to try anyway, to get a new MRI auth? If so I will need some documentation stating why we need to do a repeat MRI. Please advise. Also will need a new order put in, someone closed out the old one stating Do not schedule.

## 2021-02-03 NOTE — Telephone Encounter (Signed)
Can you put in another order for tspine MRI? Someone cancelled the previous one.

## 2021-02-03 NOTE — Telephone Encounter (Signed)
Also I am doing an appeal. They are sending me form for you to fill out plus I need documentation of why you think she needs this MRI, why we are appealing.

## 2021-02-09 ENCOUNTER — Encounter: Payer: Self-pay | Admitting: Student in an Organized Health Care Education/Training Program

## 2021-02-09 NOTE — Progress Notes (Unsigned)
Elizabeth Long is a pleasant 60 year old female with low back pain that radiates into bilateral buttocks, anterior thighs and also to her shins.  She also has neuropathy of bilateral feet.  She has a history of lumbar spine surgery that was done in 2005 with Dr. Colleen Can.   She has been evaluated by physiatry and has had multiple transforaminal injections, series of 3 without any significant response.  She is also had a hip injection with limited response.  Taje was referred to me for evaluation of spinal cord stimulator trial for failed back surgical syndrome.  Her spinal cord stimulator leads are placed at the T8-T9 interspace.  I need an MRI of her thoracic spine to rule out thoracic canal stenosis for spinal cord stimulator trial planning.  This is the reason why I have ordered a thoracic MRI.

## 2021-02-17 ENCOUNTER — Encounter: Payer: Self-pay | Admitting: Family Medicine

## 2021-03-18 ENCOUNTER — Other Ambulatory Visit: Payer: Self-pay | Admitting: Student in an Organized Health Care Education/Training Program

## 2021-03-18 DIAGNOSIS — M961 Postlaminectomy syndrome, not elsewhere classified: Secondary | ICD-10-CM

## 2021-03-18 DIAGNOSIS — M5136 Other intervertebral disc degeneration, lumbar region: Secondary | ICD-10-CM

## 2021-03-18 DIAGNOSIS — M546 Pain in thoracic spine: Secondary | ICD-10-CM

## 2021-03-25 ENCOUNTER — Ambulatory Visit
Admission: RE | Admit: 2021-03-25 | Discharge: 2021-03-25 | Disposition: A | Discharge status: Home / Self Care | Payer: BC Managed Care – PPO | Source: Ambulatory Visit | Attending: Student in an Organized Health Care Education/Training Program | Admitting: Student in an Organized Health Care Education/Training Program

## 2021-03-25 ENCOUNTER — Other Ambulatory Visit: Payer: Self-pay

## 2021-03-25 DIAGNOSIS — M961 Postlaminectomy syndrome, not elsewhere classified: Secondary | ICD-10-CM | POA: Diagnosis not present

## 2021-03-25 DIAGNOSIS — M546 Pain in thoracic spine: Secondary | ICD-10-CM | POA: Insufficient documentation

## 2021-03-29 ENCOUNTER — Ambulatory Visit (HOSPITAL_BASED_OUTPATIENT_CLINIC_OR_DEPARTMENT_OTHER): Payer: BC Managed Care – PPO | Admitting: Student in an Organized Health Care Education/Training Program

## 2021-03-29 ENCOUNTER — Other Ambulatory Visit: Payer: Self-pay

## 2021-03-29 ENCOUNTER — Ambulatory Visit
Admission: RE | Admit: 2021-03-29 | Discharge: 2021-03-29 | Disposition: A | Discharge status: Home / Self Care | Payer: BC Managed Care – PPO | Source: Ambulatory Visit | Attending: Student in an Organized Health Care Education/Training Program | Admitting: Student in an Organized Health Care Education/Training Program

## 2021-03-29 ENCOUNTER — Encounter: Payer: Self-pay | Admitting: Student in an Organized Health Care Education/Training Program

## 2021-03-29 VITALS — BP 130/84 | HR 71 | Temp 97.2°F | Resp 18 | Ht 65.0 in | Wt 129.0 lb

## 2021-03-29 DIAGNOSIS — M5416 Radiculopathy, lumbar region: Secondary | ICD-10-CM | POA: Insufficient documentation

## 2021-03-29 DIAGNOSIS — M792 Neuralgia and neuritis, unspecified: Secondary | ICD-10-CM

## 2021-03-29 DIAGNOSIS — G8929 Other chronic pain: Secondary | ICD-10-CM

## 2021-03-29 DIAGNOSIS — M961 Postlaminectomy syndrome, not elsewhere classified: Secondary | ICD-10-CM

## 2021-03-29 DIAGNOSIS — G894 Chronic pain syndrome: Secondary | ICD-10-CM | POA: Diagnosis not present

## 2021-03-29 NOTE — Patient Instructions (Signed)

## 2021-03-29 NOTE — Progress Notes (Signed)
PROVIDER NOTE: Information contained herein reflects review and annotations entered in association with encounter. Interpretation of such information and data should be left to medically-trained personnel. Information provided to patient Long be located elsewhere in the medical record under "Patient Instructions". Document created using STT-dictation technology, any transcriptional errors that may result from process are unintentional.    Patient: Elizabeth Long Long  Service Category: E/M  Provider: Gillis Santa, MD  DOB: Sep 20, 1960  DOS: 03/29/2021  Specialty: Interventional Pain Management  MRN: [id number]  Setting: Ambulatory outpatient  PCP: Elizabeth Bush, MD  Type: Established Patient    Referring Provider: Ria Bush, MD  Location: Office  Delivery: Face-to-face     HPI  Elizabeth Long Long, a 60 y.o. year old female, with has Microscopic colitis; Anxiety associated with depression; GERD (gastroesophageal reflux disease); Osteoarthritis; Irritable bowel syndrome (IBS); Post menopausal syndrome; Essential hypertension; Chronic lower back pain; High serum high density lipoprotein (HDL); Encounter for general adult medical examination with abnormal findings; Ex-smoker; Dyspnea; Cervical radiculopathy; Sinus congestion; Dizziness; Trochanteric bursitis of both hips; Chronic radicular lumbar pain; Right leg weakness; Abnormal MRI, lumbar spine; Transverse myelitis ([place]); Gait disturbance; Leg numbness; White matter abnormality on MRI of brain; Burning sensation of feet; Thyroid nodule; Postlaminectomy syndrome, lumbar region; Neuropathic pain; Chronic pain syndrome; and Thoracic spine pain on their problem list.  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 Elizabeth Long primary complain today is Back Pain Last encounter: My last encounter with her was on 02/09/2021. Pain Assessment: Severity of Chronic pain is reported as a 5 /10. Location: Back Lower/radiates down right leg to  toes. Onset: More than a month ago. Quality: Tingling, Burning. Timing: Constant. Modifying factor(s): nothing. Vitals:  height is 5' 5"  (1.651 m) and weight is 129 lb (58.5 kg). Her temporal temperature is 97.2 F (36.2 C) (abnormal). Her blood pressure is 130/84 and her pulse is 71. Her respiration is 18 and oxygen saturation is 100%.   Reason for encounter: Spinal cord stimulator trial discussion and scheduling  Elizabeth Long is a pleasant 60 year old female who presents with a chief complaint of low back pain that radiates into bilateral buttocks, anterior thighs and also to her shins.  She also has neuropathy of bilateral feet.  She has a history of lumbar spine surgery that was done in 2005 with Elizabeth Long Long.  There was no hardware placed.  It sounds like a laminectomy was performed at that time.  She has been evaluated by physiatry and has had multiple transforaminal injections, series of 3 without any significant response.  She is also had a hip injection with limited response.  She initially saw me back on 12/24/2020.  She was referred by Elizabeth Long Long with neurosurgery at that time to consider dorsal column spinal cord stimulation.  She has completed her thoracic MRI which does not show any thoracic canal stenosis.  She has also completed her psychological evaluation with Dr. Lyman Long and has been cleared from a psych standpoint for spinal cord stimulation: Both trial and implant.  Today I was able to evaluate the patient's interlaminar windows under live fluoroscopy.  They appear patent for safe percutaneous access.  ROS  Constitutional: Denies any fever or chills Gastrointestinal: No reported hemesis, hematochezia, vomiting, or acute GI distress Musculoskeletal: Denies any acute onset joint swelling, redness, loss of ROM, or weakness Neurological:  Low back and bilateral leg pain, paresthesias of bilateral feet  Medication Review  Centrum Silver 50+Women, LORazepam, Lifitegrast, Zinc  Sulfate, cetirizine, escitalopram, estradiol, ferrous sulfate, fluticasone, gabapentin, hydrOXYzine, hydrochlorothiazide, imipramine, omeprazole, and traMADol  History Review  Allergy: [name] is allergic to cefotan [cefotetan disodium] and elemental sulfur. Drug: [name]  reports no history of drug use. Alcohol:  reports no history of alcohol use. Tobacco:  reports that she quit smoking about 20 years ago. Her smoking use included cigarettes. She has never used smokeless tobacco. Social: [name]  reports that she quit smoking about 20 years ago. Her smoking use included cigarettes. She has never used smokeless tobacco. She reports that she does not drink alcohol and does not use drugs. Medical:  has a past medical history of Allergic rhinitis, Allergy, Anemia, Anxiety, Anxiety associated with depression, Chronic headaches, Depression, Essential hypertension (10/24/2015), Family history of adverse reaction to anesthesia, GERD (gastroesophageal reflux disease), History of alcoholism ([place]), History of pneumonia, Hyperlipidemia, IBS (irritable bowel syndrome), Irritable bowel syndrome (IBS), Microscopic colitis, Osteoarthritis (2000s), Pneumonia, Post menopausal syndrome (10/24/2015), Substance abuse ([place]), and Tubular adenoma of colon. Surgical: [name]  has a past surgical history that includes Anterior cervical decomp/discectomy fusion (2006); Lumbar disc surgery (2002); Appendectomy; Abdominal hysterectomy (1990); Shoulder surgery (Right); Tonsillectomy; Nose surgery; Colonoscopy (06/2011); lumbar esi (2010); Polypectomy; cyst on ovary; Colonoscopy (09/2016); Eye surgery (Bilateral); Cholecystectomy (N/A, 10/19/2016); and Colonoscopy (10/2019). Family: family history includes Alcohol abuse in her brother, paternal grandfather, and paternal grandmother; Arthritis in her father, mother, and paternal grandfather; CAD in her paternal grandmother and paternal uncle; CAD (age of onset: 38) in her  mother; Colon polyps in her brother; Diabetes in her paternal grandfather; Heart disease in her maternal grandmother and paternal grandfather; Hyperlipidemia in her paternal grandmother; Hypertension in her father and paternal grandmother; Lung cancer in her maternal grandfather; Stroke in her paternal grandfather, paternal grandmother, and paternal uncle; Throat cancer in her maternal aunt and maternal uncle.  Laboratory Chemistry Profile   Renal Lab Results  Component Value Date   BUN 11 04/08/2020   CREATININE 0.84 62/69/4854   BCR NOT APPLICABLE 62/70/3500   GFR 76.08 04/08/2020   GFRAA >60 10/19/2016   GFRNONAA >60 10/19/2016    Hepatic Lab Results  Component Value Date   AST 20 04/08/2020   ALT 12 04/08/2020   ALBUMIN 4.3 04/08/2020   ALKPHOS 49 04/08/2020   HCVAB NEGATIVE 02/10/2016    Electrolytes Lab Results  Component Value Date   NA 138 04/08/2020   K 4.3 04/08/2020   CL 100 04/08/2020   CALCIUM 9.7 04/08/2020    Bone Lab Results  Component Value Date   VD25OH 38.44 03/07/2018    Inflammation (CRP: Acute Phase) (ESR: Chronic Phase) Lab Results  Component Value Date   ESRSEDRATE 12 03/13/2019         Note: Above Lab results reviewed.  Recent Imaging Review   Lumbosacral Imaging: Lumbar MR wo contrast: Results for orders placed during the hospital encounter of 07/18/19   MR Lumbar Spine Wo Contrast   Narrative CLINICAL DATA:  Right lower back pain with radiculopathy. New right lower extremity weakness. Prior lumbar surgery.   EXAM: MRI LUMBAR SPINE WITHOUT CONTRAST   TECHNIQUE: Multiplanar, multisequence MR imaging of the lumbar spine was performed. No intravenous contrast was administered.   COMPARISON:  03/10/2009   FINDINGS: Segmentation:  Standard.   Alignment: Trace retrolisthesis of L4 on L5. Mild straightening of the normal lumbar lordosis.   Vertebrae: No fracture, suspicious marrow lesion, or evidence of discitis. Mixed Modic  type 1 and 2 degenerative endplate  changes at L4-5 and L5-S1. Minimal Modic type 1 changes at L2-3.   Conus medullaris and cauda equina: Conus extends to the L1-2 level. There is an approximately 3 mm focus of T2 hyperintensity involving the distal cord/conus at L1 (series 8, image 7 and series 7, image 9), not evident on the prior study.   Paraspinal and other soft tissues: Unremarkable.   Disc levels:   Disc desiccation from L2-3 to L5-S1. Moderate disc space narrowing at L2-3 and severe narrowing at L4-5 and asymmetric to the left L5-S1.   T12-L1: Negative.   L1-2: Minimal disc bulging without stenosis.   L2-3: A new left subarticular disc protrusion results in moderate left lateral recess stenosis and potential left L3 nerve root impingement. A chronic left foraminal and extraforaminal disc protrusion appears slightly larger than on the prior study but does not result in significant neural foraminal stenosis or definite L2 nerve root impingement. Disc bulging contributes to borderline spinal and right lateral recess stenosis. Patent right neural foramina.   L3-4: New mild disc bulging and mild facet arthrosis result in borderline bilateral lateral recess stenosis without spinal or neural foraminal stenosis.   L4-5: Progressive severe disc degeneration. Disc bulging, endplate spurring, disc space height loss, and mild facet hypertrophy result in mild right and minimal left neural foraminal stenosis and mild left lateral recess stenosis without spinal stenosis.   L5-S1: Progressive disc degeneration. Disc bulging eccentric to the left and mild facet hypertrophy result in new mild left neural foraminal stenosis without spinal stenosis.   IMPRESSION: 1. New left subarticular disc protrusion at L2-3 with left lateral recess stenosis and potential L3 nerve root impingement. 2. New disc bulging at L3-4 without significant stenosis. 3. Progressive disc degeneration at L4-5  and L5-S1 with mild neural foraminal and lateral recess stenosis as above. 4. New small focus of signal abnormality in the distal spinal cord at L1. This is nonspecific, however given a similar focus of signal abnormality in the cervical spinal cord on a 2011 MRI this raises the concern for demyelinating disease. Correlate for any clinical symptoms that might indicate multiple sclerosis, and consider MRI of the brain, cervical spine, and thoracic spine for further evaluation.     Electronically Signed By: Logan Bores M.D. On: 07/18/2019 12:17       Narrative CLINICAL DATA:  Chronic low back pain.  Bilateral hip and leg pain.   EXAM: MRI LUMBAR SPINE WITHOUT AND WITH CONTRAST   TECHNIQUE: Multiplanar and multiecho pulse sequences of the lumbar spine were obtained without and with intravenous contrast.   CONTRAST:  66m GADAVIST GADOBUTROL 1 MMOL/ML IV SOLN   COMPARISON:  MRI lumbar spine without contrast 07/18/2019   FINDINGS: Segmentation:  Normal   Alignment:  Normal   Vertebrae:  Negative for fracture or mass.   Conus medullaris and cauda equina: Conus extends to the L1-2 level. Nonenhancing hyperintensity in the conus medullaris unchanged from the prior study. Remaining visualized cord negative. Cauda carina negative.   Paraspinal and other soft tissues: Negative for paraspinous mass or adenopathy.   Disc levels:   L1-2: Mild disc and facet degeneration.  Negative for stenosis   L2-3: A left-sided disc protrusion unchanged. Left subarticular stenosis and possible impingement left L3 nerve root unchanged. Mild facet degeneration bilaterally.   L3-4: Disc and facet degeneration.  No significant stenosis   L4-5: Extensive disc degeneration with fatty endplate changes centrally and to the left and mild discogenic edema on the right. Discogenic edema  has progressed in the interval. Diffuse endplate spurring and bilateral facet degeneration. Moderate  subarticular stenosis bilaterally unchanged from the prior study.   L5-S1: Disc degeneration with disc space narrowing and degenerative endplate edema which has progressed in the interval. This is more prominent the left than the right. Bilateral facet degeneration. Mild left subarticular stenosis due to spurring is unchanged.   IMPRESSION: 1. Nonenhancing hyperintensity in the conus medullaris unchanged from the prior study. Possible demyelinating disease. 2. Left-sided disc protrusion L2-3 unchanged with subarticular stenosis on the left. 3. Progressive discogenic edema on the right at L4-5. Diffuse endplate spurring causing moderate subarticular stenosis bilaterally, unchanged 4. Mild left subarticular stenosis at L5-S1 due to spurring. Progressive degenerative endplate edema on the left at L5-S1.     Electronically Signed By: Franchot Gallo M.D. On: 08/24/2020 09:09 DG Lumbar Spine Complete   Narrative CLINICAL DATA:  Low back pain with right-sided radicular symptoms   EXAM: LUMBAR SPINE - COMPLETE 4+ VIEW   COMPARISON:  None.   FINDINGS: Frontal, lateral, spot lumbosacral lateral, and bilateral oblique views were obtained. There are 5 non-rib-bearing lumbar type vertebral bodies. There is no evident fracture. There is 2 mm of retrolisthesis of L4 on L5. No other spondylolisthesis. There is severe disc space narrowing at L4-5 and L5-S1. There is moderate disc space narrowing at L2-3. Other disc spaces appear unremarkable. There is facet osteoarthritic change at L4-5 and L5-S1 bilaterally. There is aortic atherosclerosis.   IMPRESSION: Osteoarthritic change with disc space narrowing at L2-3, L4-5, and L5-S1, most severe at L4-5. Mild spondylolisthesis at L4-5 is felt to be due to underlying spondylosis. No evident fracture.   Aortic Atherosclerosis (ICD10-I70.0).     Electronically Signed By: Lowella Grip III M.D. On: 07/18/2019 08:30  DG Epidurography    Narrative RIGHT L4-L5 TRANSFORAMINAL EPIDURAL STEROID INJECTION   Clinical Data: Displacement lumbar intervertebral disc without myelopathy.  Incomplete relief of symptoms following last injection.  I proceeded with a transforaminal approach, as the patient's symptoms have some similarity with a right L4 radiculopathy, and there is MR evidence for a foraminal/extraforaminal protrusion at L4-5 on the right.   Comparison: Multiple priors.   Fully informed written consent was obtained on the first visit. The patient was placed prone on the fluoroscopic table, the L4-L5 interspace was visualized, and the skin was marked.  After thorough povidone-iodine scrubbing, the site was draped in sterile fashion. 1% lidocaine was used to anesthetize the skin and deeper soft tissues.  A 22 gauge spinal needle was inserted and advanced into the foramen, with the tip directly under the pedicle, being careful not to advance the needle too medially into the spinal canal. Needle tip placement within the foraminal epidural space was confirmed using 1 mL of non-ionic contrast, with good perineural opacification of the right L4 nerve root.  There was no intravascular or subarachnoid opacification.  Next, 120 mg of Depo- Medrol was administered and flushed using 2 mL of normal saline. The needle was restyletted and withdrawn.  The patient tolerated the procedure well and there was no evidence of procedural complication.   The patient was able to ambulate to the recovery area and observed for an appropriate length of time as outlined on the nursing notes. The patient was discharged home with a driver in good condition with detailed patient instructions.   Fluoroscopy time: 45 seconds   IMPRESSION:   Satisfactory transforaminal lumbar epidural steroid injection, right L4-L5 transforaminal, right L4 perineural.   Provider:  Ceasar Lund  Narrative & Impression  CLINICAL DATA:  Mid back pain, rule out  thoracic canal stenosis, SCS trial planning   EXAM: MRI THORACIC SPINE WITHOUT CONTRAST   TECHNIQUE: Multiplanar, multisequence MR imaging of the thoracic spine was performed. No intravenous contrast was administered.   COMPARISON:  None.   FINDINGS: Alignment:  Physiologic.   Vertebrae: No fracture, evidence of discitis, or bone lesion.   Cord:  Normal signal and morphology.   Paraspinal and other soft tissues: Negative.   Disc levels:   Tiny disc bulges T6-T11. No spinal canal stenosis or significant neural foraminal narrowing.   IMPRESSION: No spinal canal stenosis or significant neural foraminal narrowing in the thoracic spine.       Physical Exam  General appearance: Well nourished, well developed, and well hydrated. In no apparent acute distress Mental status: Alert, oriented x 3 (person, place, & time)       Respiratory: No evidence of acute respiratory distress Eyes: PERLA Vitals: BP 130/84   Pulse 71   Temp (!) 97.2 F (36.2 C) (Temporal)   Resp 18   Ht 5' 5"  (1.651 m)   Wt 129 lb (58.5 kg)   SpO2 100%   BMI 21.47 kg/m  BMI: Estimated body mass index is 21.47 kg/m as calculated from the following:   Height as of this encounter: 5' 5"  (1.651 m).   Weight as of this encounter: 129 lb (58.5 kg). Ideal: Ideal body weight: 57 kg (125 lb 10.6 oz) Adjusted ideal body weight: 57.6 kg (127 lb)    Lumbar Spine Area Exam  Skin & Axial Inspection: Well healed scar from previous spine surgery detected Alignment: Symmetrical Functional ROM: Unrestricted ROM       Stability: No instability detected Muscle Tone/Strength: Functionally intact. No obvious neuro-muscular anomalies detected. Sensory (Neurological): Dermatomal pain pattern Positive straight leg raise test bilaterally   Gait & Posture Assessment  Ambulation: Unassisted Gait: Relatively normal for age and body habitus Posture: WNL   Lower Extremity Exam      Side: Right lower extremity   Side:  Left lower extremity  Stability: No instability observed           Stability: No instability observed          Skin & Extremity Inspection: Skin color, temperature, and hair growth are WNL. No peripheral edema or cyanosis. No masses, redness, swelling, asymmetry, or associated skin lesions. No contractures.   Skin & Extremity Inspection: Skin color, temperature, and hair growth are WNL. No peripheral edema or cyanosis. No masses, redness, swelling, asymmetry, or associated skin lesions. No contractures.  Functional ROM: Pain restricted ROM for the hip joint                   Functional ROM: Pain restricted ROM for hip joint                  Muscle Tone/Strength: Functionally intact. No obvious neuro-muscular anomalies detected.   Muscle Tone/Strength: Functionally intact. No obvious neuro-muscular anomalies detected.  Sensory (Neurological): Neuropathic pain pattern         Sensory (Neurological): Neuropathic pain pattern        DTR: Patellar: deferred today Achilles: deferred today Plantar: deferred today   DTR: Patellar: deferred today Achilles: deferred today Plantar: deferred today  Palpation: No palpable anomalies   Palpation: No palpable anomalies     Assessment   Status Diagnosis  Persistent Persistent Persistent 1. Failed back surgical syndrome  2. Postlaminectomy syndrome, lumbar region   3. Chronic radicular lumbar pain   4. Neuropathic pain   5. Chronic pain syndrome       Plan of Care   I discussed  percutaneous spinal cord stimulator trial with the patient in detail. I explained to the patient that they will have an external power source and programmer which the patient will use for 7 days. There will be daily communication with the stimulator company and the patient. A possible need for a mid-trial clinic visit to give the patient the best chance of success.   Patient has had a horough psychosocial behavioral evaluation with  Dr Elizabeth Long Long and has been cleared.   MRI  of the thoracic spine to rule out thoracic canal stenosis for SCS trial planning has been obtained, results above. Patient's interlaminar windows appear patent for safe percutaenous access.  Some of patient's pain does seem to be mechanical in nature, with some component of neurogenic pain as well. We discussed the indications for spinal cord stimulation, specifically stating that it is typically better for neuropathic and appendicular pain, but that we have had some success in the treatment of low back and hip pain.   Patient is interested in proceeding with spinal cord stimulation trial. She understands that this may not be successful, and that spinal cord stimulation in general is not a "magic bullet."   We had a lengthy and very detailed discussion of all the risks, benefits, alternatives, and rationale of surgery as well as the option of continuing nonsurgical therapies. We specifically discussed the risks of temporary or permanent worsened neurologic injury, no symptomatic relief or pain made worse after procedure, and also the need for future surgery (due to infection, CSF leak, bleeding, adjacent segment issues, bone-healing difficulties, and other related issues). No guarantees of outcome were made or implied and he is eager to proceed and presents for definitive treatment.   She told me that all of his questions were answered thoroughly and to her satisfaction. Confidence and understanding of the discussed risks and consequences of  treatment was expressed and she accepted these risks and was eager to proceed with procedure pending imaging and psych eval.   Issues concerning treatment and diagnosis were discussed with the patient. There are no barriers to understanding the plan of treatment. Explanation was well received by patient and/or family who then verbalized understanding.      Orders:  Orders Placed This Encounter  Procedures   [place]  representative to notify them of the scheduled case and to make sure they will be available to provide required equipment.    Standing Status:   Future    Standing Expiration Date:   09/27/2021    Scheduling Instructions:     Side: Bilateral     Level: Lumbar     Device: Medtronic     Sedation: With sedation     Timeframe: As soon as pre-approved    Order Specific Question:   Where will this procedure be performed?    Answer:   [facility]   DG PAIN [facility] C-ARM 1-60 MIN NO REPORT    Intraoperative interpretation by procedural physician at [place].    Standing Status:   Standing    Number of Occurrences:   1    Order Specific Question:   Reason for exam:    Answer:   Assistance in needle guidance and placement for procedures requiring needle  placement in or near specific anatomical locations not easily accessible without such assistance.   Follow-up plan:   Return in about 9 days (around 04/07/2021) for Medtronic SCS trial , moderate sedation (ECT suite).        Recent Visits No visits were found meeting these conditions. Showing recent visits within past 90 days and meeting all other requirements Today's Visits Date Type Provider Dept  03/29/21 Office Visit Elizabeth Santa, MD [facility]  Showing today's visits and meeting all other requirements Future Appointments No visits were found meeting these conditions. Showing future appointments within next 90 days and meeting all other requirements I discussed the assessment and treatment plan with the patient. The patient was provided an opportunity to ask questions and all were answered. The patient agreed with the plan and demonstrated an understanding of the instructions.  Patient advised to call back or seek an in-person evaluation if the symptoms or condition worsens.  Duration of encounter: 85mnutes.  Note by: BGillis Santa MD Date: 03/29/2021; Time: 2:28 PM

## 2021-04-05 ENCOUNTER — Other Ambulatory Visit: Payer: Self-pay | Admitting: Family Medicine

## 2021-04-07 ENCOUNTER — Encounter: Payer: Self-pay | Admitting: Student in an Organized Health Care Education/Training Program

## 2021-04-07 ENCOUNTER — Other Ambulatory Visit: Payer: Self-pay

## 2021-04-07 ENCOUNTER — Ambulatory Visit (HOSPITAL_BASED_OUTPATIENT_CLINIC_OR_DEPARTMENT_OTHER): Payer: BC Managed Care – PPO | Admitting: Student in an Organized Health Care Education/Training Program

## 2021-04-07 ENCOUNTER — Ambulatory Visit
Admission: RE | Admit: 2021-04-07 | Discharge: 2021-04-07 | Disposition: A | Discharge status: Home / Self Care | Payer: BC Managed Care – PPO | Source: Ambulatory Visit | Attending: Student in an Organized Health Care Education/Training Program | Admitting: Student in an Organized Health Care Education/Training Program

## 2021-04-07 VITALS — BP 137/75 | HR 82 | Temp 97.1°F | Resp 18 | Ht 64.0 in | Wt 129.0 lb

## 2021-04-07 DIAGNOSIS — M5416 Radiculopathy, lumbar region: Secondary | ICD-10-CM | POA: Insufficient documentation

## 2021-04-07 DIAGNOSIS — G894 Chronic pain syndrome: Secondary | ICD-10-CM

## 2021-04-07 DIAGNOSIS — M961 Postlaminectomy syndrome, not elsewhere classified: Secondary | ICD-10-CM | POA: Diagnosis not present

## 2021-04-07 DIAGNOSIS — M792 Neuralgia and neuritis, unspecified: Secondary | ICD-10-CM | POA: Diagnosis not present

## 2021-04-07 DIAGNOSIS — G8929 Other chronic pain: Secondary | ICD-10-CM | POA: Insufficient documentation

## 2021-04-07 MED ORDER — FENTANYL CITRATE (PF) 100 MCG/2ML IJ SOLN
INTRAMUSCULAR | Status: AC
  Filled 2021-04-07: qty 2

## 2021-04-07 MED ORDER — ROPIVACAINE HCL 2 MG/ML IJ SOLN
20.0000 mL | Freq: Once | INTRAMUSCULAR | Status: AC
  Administered 2021-04-07: 20 mL

## 2021-04-07 MED ORDER — MIDAZOLAM HCL 5 MG/5ML IJ SOLN
INTRAMUSCULAR | Status: AC
  Filled 2021-04-07: qty 5

## 2021-04-07 MED ORDER — LACTATED RINGERS IV SOLN
1000.0000 mL | Freq: Once | INTRAVENOUS | Status: AC
  Administered 2021-04-07: 1000 mL via INTRAVENOUS

## 2021-04-07 MED ORDER — MIDAZOLAM HCL 5 MG/5ML IJ SOLN
0.5000 mg | Freq: Once | INTRAMUSCULAR | Status: AC
  Administered 2021-04-07: 1 mg via INTRAVENOUS

## 2021-04-07 MED ORDER — CEFAZOLIN SODIUM 1 G IJ SOLR
INTRAMUSCULAR | Status: AC
  Filled 2021-04-07: qty 20

## 2021-04-07 MED ORDER — CEFAZOLIN SODIUM-DEXTROSE 2-4 GM/100ML-% IV SOLN
2.0000 g | Freq: Once | INTRAVENOUS | Status: AC
  Administered 2021-04-07: 2 g via INTRAVENOUS
  Filled 2021-04-07: qty 100

## 2021-04-07 MED ORDER — FENTANYL CITRATE (PF) 100 MCG/2ML IJ SOLN
25.0000 ug | INTRAMUSCULAR | Status: DC | PRN
  Administered 2021-04-07: 50 ug via INTRAVENOUS

## 2021-04-07 MED ORDER — CEPHALEXIN 500 MG PO CAPS
500.0000 mg | ORAL_CAPSULE | Freq: Four times a day (QID) | ORAL | 0 refills | Status: AC
Start: 2021-04-07 — End: 2021-04-17

## 2021-04-07 MED ORDER — LIDOCAINE HCL 2 % IJ SOLN
20.0000 mL | Freq: Once | INTRAMUSCULAR | Status: AC
  Administered 2021-04-07: 400 mg

## 2021-04-07 NOTE — Patient Instructions (Signed)
Today we did the following -We have done a Spinal Cord Stimulator Trial with [employer]  -As long as the leads are in place, do not bathe or shower. You may sponge bathe.  -While the lead is in place, please limit the bending, lifting, or twisting because the lead can move.  -The things we want to see is if your pain improves (and by what percentage), if you can do more activity (don't overdo it), and if you can use less of your "as needed" medicine. Do not stop long acting medicines like methadone, oxycontin, MS Contin, etc without checking with [place].  -It is VERY important that you pick up the antibiotics we prescribed, Keflex, on your way home from the trial and take them as prescribed(4 times a day), starting today, for as long as the lead is in place.  -The Spina Cord Stimulator Representative will be in contact with you while the lead is in place to make sure the trial goes as well as possible.  -Please contact us with any questions or concerns at any time during the trial.   -If you start running a fever over 100 degrees, have severe back pain, or new pain running down the legs, or drainage coming from the lead site, contact us immediately and/or go to the emergency room.  -Please do not restart any sort of medication that can thin your blood such as Aspirin, ibuprofen, motrin, aleve, plavix, coumadin, etc. If you aren't sure, call and ask.  -We will have you return on 04/14/21 to have the lead removed. If this is successful, at that point we can go over the details about the permanent implant.

## 2021-04-07 NOTE — Progress Notes (Signed)
PROVIDER NOTE: Information contained herein reflects review and annotations entered in association with encounter. Interpretation of such information and data should be left to medically-trained personnel. Information provided to patient can be located elsewhere in the medical record under "Patient Instructions". Document created using STT-dictation technology, any transcriptional errors that may result from process are unintentional.    Patient: Elizabeth Long  Service Category: Procedure Provider: Gillis Santa, MD DOB: 10/15/1960 DOS: 04/07/2021 Location: [facility] MRN: [id number] Setting: Ambulatory - outpatient Referring Provider: Gillis Santa, MD Type: Established Patient Specialty: Interventional Pain Management PCP: Ria Bush, MD  Primary Reason for Admission: Surgical management of chronic pain condition.  Procedure:  Anesthesia, Analgesia, Anxiolysis:  Type: [employer] Trial Spinal Cord Neurostimulator Implant (Percutaneous, interlaminar, posterior epidural placement) Purpose: To determine if a permanent implant may be effective in controlling some or all of Elizabeth Long's chronic pain symptoms.  Region: Lumbar Level: T12-L1 Laterality: Bilateral   Anesthesia: Local (1-2% Lidocaine)  Anxiolysis: IV  Sedation: Moderate  Guidance: Fluoroscopy            Indications: 1. Failed back surgical syndrome   2. Postlaminectomy syndrome, lumbar region   3. Chronic radicular lumbar pain   4. Neuropathic pain   5. Chronic pain syndrome    Pain Score: Pre-procedure: 5 /10 Post-procedure: 0-No pain/10   Pre-op H&P Assessment:  Elizabeth Long is a 60 y.o. (year old), female patient, seen today for interventional treatment. She  has a past surgical history that includes Anterior cervical decomp/discectomy fusion (2006); Lumbar disc surgery (2002); Appendectomy; Abdominal hysterectomy (1990); Shoulder surgery (Right); Tonsillectomy; Nose surgery; Colonoscopy (06/2011);  lumbar esi (2010); Polypectomy; cyst on ovary; Colonoscopy (09/2016); Eye surgery (Bilateral); Cholecystectomy (N/A, 10/19/2016); and Colonoscopy (10/2019).  Initial Vital Signs:  Pulse/EKG Rate: 82ECG Heart Rate: 61 Temp: (!) 96.9 F (36.1 C) Resp: 14 BP: 127/82 SpO2: 99 %  BMI: Estimated body mass index is 22.14 kg/m as calculated from the following:   Height as of this encounter: 5\' 4"  (1.626 m).   Weight as of this encounter: 129 lb (58.5 kg).  Risk Assessment: Allergies: Reviewed. She is allergic to cefotan [cefotetan disodium] and elemental sulfur.  Allergy Precautions: None required Coagulopathies: Reviewed. None identified.  Blood-thinner therapy: None at this time Active Infection(s): Reviewed. None identified. Elizabeth Long is afebrile  Site Confirmation: Elizabeth Long was asked to confirm the procedure and laterality before marking the site, which she did. Procedure checklist: Completed Consent: Before the procedure and under the influence of no sedative(s), amnesic(s), or anxiolytics, the patient was informed of the treatment options, risks and possible complications. To fulfill our ethical and legal obligations, as recommended by the American Medical Association's Code of Ethics, I have informed the patient of my clinical impression; the nature and purpose of the treatment or procedure; the risks, benefits, and possible complications of the intervention; the alternatives, including doing nothing; the risk(s) and benefit(s) of the alternative treatment(s) or procedure(s); and the risk(s) and benefit(s) of doing nothing.  Elizabeth Long was provided with information about the general risks and possible complications associated with most interventional procedures. These include, but are not limited to: failure to achieve desired goals, infection, bleeding, organ or nerve damage, allergic reactions, paralysis, and/or death.  In addition, she was informed of those risks and possible  complications associated to this particular procedure, which include, but are not limited to: damage to the implant; failure to decrease pain; local, systemic, or serious CNS infections, intraspinal abscess with possible cord  compression and paralysis, or life-threatening such as meningitis; intrathecal and/or epidural bleeding with formation of hematoma with possible spinal cord compression and permanent paralysis; organ damage; nerve injury or damage with subsequent sensory, motor, and/or autonomic system dysfunction, resulting in transient or permanent pain, numbness, and/or weakness of one or several areas of the body; allergic reactions, either minor or major life-threatening, such as anaphylactic or anaphylactoid reactions.  Furthermore, Elizabeth Long was informed of those risks and complications associated with the medications. These include, but are not limited to: allergic reactions (i.e.: anaphylactic or anaphylactoid reactions); arrhythmia;  Hypotension/hypertension; cardiovascular collapse; respiratory depression and/or shortness of breath; swelling or edema; medication-induced neural toxicity; particulate matter embolism and blood vessel occlusion with resultant organ, and/or nervous system infarction and permanent paralysis.  Finally, she was informed that Medicine is not an exact science; therefore, there is also the possibility of unforeseen or unpredictable risks and/or possible complications that may result in a catastrophic outcome. The patient indicated having understood very clearly. We have given the patient no guarantees and we have made no promises. Enough time was given to the patient to ask questions, all of which were answered to the patient's satisfaction. Elizabeth Long has indicated that she wanted to continue with the procedure. Attestation: I, the ordering provider, attest that I have discussed with the patient the benefits, risks, side-effects, alternatives, likelihood of achieving  goals, and potential problems during recovery for the procedure that I have provided informed consent. Date  Time: 04/07/2021  8:52 AM  Pre-Procedure Preparation:  Monitoring: As per clinic protocol. Respiration, ETCO2, SpO2, BP, heart rate and rhythm monitor placed and checked for adequate function Safety Precautions: Patient was assessed for positional comfort and pressure points before starting the procedure. Time-out: I initiated and conducted the "Time-out" before starting the procedure, as per protocol. The patient was asked to participate by confirming the accuracy of the "Time Out" information. Verification of the correct person, site, and procedure were performed and confirmed by me, the nursing staff, and the patient. "Time-out" conducted as per Joint Commission's Universal Protocol (UP.01.01.01). Time: 0950  Description of Procedure Process:   Position: Prone Target Area: Posterior epidural space Approach: Posterior percutaneous, paramedial, interlaminar approach Area Prepped: Bilateral thoraco-lumbar Region Prepping solution: ChloraPrep (2% chlorhexidine gluconate and 70% isopropyl alcohol) Safety Precautions: Safe injection practices and needle disposal techniques used. Medications properly checked for expiration dates. SDV (single dose vial) medications used. Aspiration looking for blood return and/or CSF was conducted prior to all injections. At no point did I inject any substances, as a needle was being advanced. No attempts were made at seeking any paresthesias.  Description of the Procedure: Availability of a responsible, adult driver, and NPO status confirmed. Informed consent was obtained after having discussed risks and possible complications. An IV was started. The patient was then taken to the fluoroscopy suite, where the patient was placed in position for the procedure, over the fluoroscopy table. The patient was then monitored in the usual manner. Fluoroscopy was  manipulated to obtain the best possible view of the target. Parallex error was corrected before commencing the procedure. Once a clear view of the target had been obtained, the skin and deeper tissues over the procedure site were infiltrated using lidocaine, loaded in a 10 cc luer-loc syringe with a 0.5 inch, 25-G needle. The introducer needle(s) was/were then inserted through the skin and deeper tissues. A paramidline approach was used to enter the posterior epidural space at a 30 angle, using "Loss-of-resistance Technique" with  3 ml of PF-NaCl (0.9% NSS). Correct needle placement was confirmed in the antero-posterior and lateral fluoroscopic views. The lead was gently introduced and manipulated under real-time fluoroscopy, constantly assessing for pain, discomfort, or paresthesias, until the tip rested at the desired level. Both sides were done in identical fashion. Electrode placement was tested until appropriate coverage was attained. Once the patient confirmed that the stimulation was over the desired area, the lead(s) was/were secured in place and the introducer needles removed. This was done under real-time fluoroscopy while observing the electrode tip to avoid unintended migration. The area was covered with a non-occlusive dressing and the patient transported to recovery for further programming.  Vitals:   04/07/21 1022 04/07/21 1032 04/07/21 1042 04/07/21 1054  BP: 122/71 131/69 134/72 137/75  Pulse:      Resp: 18 18 18 18   Temp:  (!) 96.9 F (36.1 C)  (!) 97.1 F (36.2 C)  TempSrc:      SpO2: 100% 99% 99% 100%  Weight:      Height:       Start Time: 0950 hrs. End Time: 1017 hrs.  Neurostimulator Details:   Lead(s):  Brand: Medtronic         Epidural Access Level:  T12-L1 T12-L1  Lead implant:  Bilateral   No. of Electrodes/Lead:  8 8  Laterality:  Left Right  Top electrode location:  T7-8 T8  Model No.: [id number]  Length: 60cm 60cm  Lot No.: [id number]  MRI  compatibility:  Yes Yes  External Neurostimulator    Model No.: [id number]   Serial No.: [id number]    Imaging Guidance (Spinal):          Type of Imaging Technique: Fluoroscopy Guidance (Spinal) Indication(s): Assistance in needle guidance and placement for procedures requiring needle placement in or near specific anatomical locations not easily accessible without such assistance. Exposure Time: Please see nurses notes. Contrast: None used. Fluoroscopic Guidance: I was personally present during the use of fluoroscopy. "Tunnel Vision Technique" used to obtain the best possible view of the target area. Parallax error corrected before commencing the procedure. "Direction-depth-direction" technique used to introduce the needle under continuous pulsed fluoroscopy. Once target was reached, antero-posterior, oblique, and lateral fluoroscopic projection used confirm needle placement in all planes. Images permanently stored in EMR. Interpretation: No contrast injected. I personally interpreted the imaging intraoperatively. Adequate needle placement confirmed in multiple planes. Permanent images saved into the patient's record.      Antibiotic Prophylaxis:   Anti-infectives (From admission, onward)    Start     Dose/Rate Route Frequency Ordered Stop   04/07/21 0900  ceFAZolin (ANCEF) IVPB 2g/100 mL premix        2 g 200 mL/hr over 30 Minutes Intravenous  Once 04/07/21 0856 04/07/21 0952   04/07/21 0000  cephALEXin (KEFLEX) 500 MG capsule        500 mg Oral 4 times daily 04/07/21 0817 04/17/21 2359      Indication(s): Implant Prophylaxis.  Post-operative Assessment:  Post-procedure Vital Signs:  Pulse/HCG Rate: 82(!) 59 Temp: (!) 97.1 F (36.2 C) Resp: 18 BP: 137/75 SpO2: 657 %  Complications: No immediate post-treatment complications observed by team, or reported by patient.  Note: The patient tolerated the entire procedure well. A repeat set of vitals were taken after the procedure and  the patient was kept under observation following institutional policy, for this type of procedure. Post-procedural neurological assessment was performed, showing return to baseline, prior to discharge. The patient was  provided with post-procedure discharge instructions, including a section on how to identify potential problems. Should any problems arise concerning this procedure, the patient was given instructions to immediately contact us, at any time, without hesitation. In any case, we plan to contact the patient by telephone for a follow-up status report regarding this interventional procedure.  Comments:  No additional relevant information.  Plan of Care  Orders:  Orders Placed This Encounter  Procedures   DG PAIN [facility] C-ARM 1-60 MIN NO REPORT    Intraoperative interpretation by procedural physician at [place].    Standing Status:   Standing    Number of Occurrences:   1    Order Specific Question:   Reason for exam:    Answer:   Assistance in needle guidance and placement for procedures requiring needle placement in or near specific anatomical locations not easily accessible without such assistance.     Medications administered: We administered lidocaine, lactated ringers, midazolam, ceFAZolin, fentaNYL, and ropivacaine (PF) 2 mg/mL (0.2%).  See the medical record for exact dosing, route, and time of administration.  Follow-up plan:   Return in about 1 week (around 04/14/2021) for SCS lead pull (10 am).     Recent Visits Date Type Provider Dept  03/29/21 Office Visit Gillis Santa, MD [facility]  Showing recent visits within past 90 days and meeting all other requirements Today's Visits Date Type Provider Dept  04/07/21 Procedure visit Gillis Santa, MD [facility]  Showing today's visits and meeting all other requirements Future Appointments Date Type Provider Dept  04/14/21 Appointment Gillis Santa, MD [facility]  Showing  future appointments within next 90 days and meeting all other requirements Disposition: Discharge home  Discharge (Date  Time): 04/07/2021; 1113 hrs.   Primary Care Physician: Ria Bush, MD Location: [facility] Note by: Gillis Santa, MD Date: 04/07/2021; Time: 11:38 AM

## 2021-04-08 ENCOUNTER — Telehealth: Payer: Self-pay | Admitting: *Deleted

## 2021-04-08 NOTE — Telephone Encounter (Signed)
No problems post procedure. 

## 2021-04-09 ENCOUNTER — Telehealth: Payer: Self-pay | Admitting: *Deleted

## 2021-04-09 NOTE — Telephone Encounter (Signed)
Post SCS trial call;  voicemail left that we were checking on her.  If any questions or concerns please call our office or reach out to Adam/medtronic representative.

## 2021-04-14 ENCOUNTER — Encounter: Payer: Self-pay | Admitting: Student in an Organized Health Care Education/Training Program

## 2021-04-14 ENCOUNTER — Other Ambulatory Visit: Payer: Self-pay

## 2021-04-14 ENCOUNTER — Ambulatory Visit
Admission: RE | Admit: 2021-04-14 | Discharge: 2021-04-14 | Disposition: A | Discharge status: Home / Self Care | Payer: BC Managed Care – PPO | Source: Ambulatory Visit | Attending: Student in an Organized Health Care Education/Training Program | Admitting: Student in an Organized Health Care Education/Training Program

## 2021-04-14 ENCOUNTER — Ambulatory Visit: Admission: RE | Admit: 2021-04-14 | Payer: BC Managed Care – PPO | Source: Ambulatory Visit

## 2021-04-14 ENCOUNTER — Ambulatory Visit (HOSPITAL_BASED_OUTPATIENT_CLINIC_OR_DEPARTMENT_OTHER): Payer: BC Managed Care – PPO | Admitting: Student in an Organized Health Care Education/Training Program

## 2021-04-14 VITALS — BP 129/81 | HR 70 | Temp 97.0°F | Resp 18 | Ht 64.0 in | Wt 129.0 lb

## 2021-04-14 DIAGNOSIS — M961 Postlaminectomy syndrome, not elsewhere classified: Secondary | ICD-10-CM

## 2021-04-14 DIAGNOSIS — M5416 Radiculopathy, lumbar region: Secondary | ICD-10-CM

## 2021-04-14 DIAGNOSIS — G8929 Other chronic pain: Secondary | ICD-10-CM | POA: Insufficient documentation

## 2021-04-14 NOTE — Progress Notes (Signed)
We know when you are ready to go with TM PROVIDER NOTE: Information contained herein reflects review and annotations entered in association with encounter. Interpretation of such information and data should be left to medically-trained personnel. Information provided to patient can be located elsewhere in the medical record under "Patient Instructions". Document created using STT-dictation technology, any transcriptional errors that may result from process are unintentional.    Patient: Elizabeth Long  Service Category: E/M  Provider: Gillis Santa, MD  DOB: [birth year]  DOS: 04/14/2021  Specialty: Interventional Pain Management  MRN: [id number]  Setting: Ambulatory outpatient  PCP: Ria Bush, MD  Type: Established Patient    Referring Provider: Ria Bush, MD  Location: Office  Delivery: Face-to-face     HPI  [name], a 60 y.o. 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 [name] primary complain today is Back Pain (lower) Last encounter: My last encounter with her was on 04/07/2021. Pain Assessment: Severity of Chronic pain is reported as a 2 /10. Location: Back Lower/both legs. Onset: More than a month ago. Quality: Tingling. Timing: Intermittent. Modifying factor(s): nothing. Vitals:  height is 5' 4"  (1.626 m) and weight is 129 lb (58.5 kg). Her temporal temperature is 97 F (36.1 C) (abnormal). Her blood pressure is 129/81 and her pulse is 70. Her respiration is 18 and oxygen saturation is 100%.  Okay #6  Reason for encounter:  MEDTRONIC SCS TRIAL LEAD REMOVAL    Shayanne follows up today for spinal cord stimulator trial lead removal.  She had a successful spinal cord stimulator trial and endorses greater than 80% pain relief over the last 7 days and improvement in functional status as well given her ability to perform her ADLs comfortably and less pain.  Trial leads were removed under live fluoroscopy.  There was no evidence of lead  migration.  Trial lead tips were intact.  Referral to Dr. Lacinda Axon for percutaneous permanent implant.   ROS  Constitutional: Denies any fever or chills Gastrointestinal: No reported hemesis, hematochezia, vomiting, or acute GI distress Musculoskeletal:  Improvement in low back and leg pain Neurological: No reported episodes of acute onset apraxia, aphasia, dysarthria, agnosia, amnesia, paralysis, loss of coordination, or loss of consciousness  Medication Review  Centrum Silver 50+Women, LORazepam, Lifitegrast, Zinc Sulfate, cephALEXin, cetirizine, escitalopram, estradiol, ferrous sulfate, fluticasone, gabapentin, hydrOXYzine, hydrochlorothiazide, imipramine, omeprazole, and traMADol  History Review  Allergy: [name] is allergic to cefotan [cefotetan disodium] and elemental sulfur. Drug: [name]  reports no history of drug use. Alcohol:  reports no history of alcohol use. Tobacco:  reports that she quit smoking about 21 years ago. Her smoking use included cigarettes. She has never used smokeless tobacco. Social: [name]  reports that she quit smoking about 21 years ago. Her smoking use included cigarettes. She has never used smokeless tobacco. She reports that she does not drink alcohol and does not use drugs. Medical:  has a past medical history of Allergic rhinitis, Allergy, Anemia, Anxiety, Anxiety associated with depression, Chronic headaches, Depression, Essential hypertension (10/24/2015), Family history of adverse reaction to anesthesia, GERD (gastroesophageal reflux disease), History of alcoholism ([place]), History of pneumonia, Hyperlipidemia, IBS (irritable bowel syndrome), Irritable bowel syndrome (IBS), Microscopic colitis, Osteoarthritis (2000s), Pneumonia, Post menopausal syndrome (10/24/2015), Substance abuse ([place]), and Tubular adenoma of colon. Surgical: [name]  has a past surgical history that includes Anterior cervical decomp/discectomy fusion (2006); Lumbar disc surgery  (2002); Appendectomy; Abdominal hysterectomy (1990); Shoulder surgery (  Right); Tonsillectomy; Nose surgery; Colonoscopy (06/2011); lumbar esi (2010); Polypectomy; cyst on ovary; Colonoscopy (09/2016); Eye surgery (Bilateral); Cholecystectomy (N/A, 10/19/2016); and Colonoscopy (10/2019). Family: family history includes Alcohol abuse in her brother, paternal grandfather, and paternal grandmother; Arthritis in her father, mother, and paternal grandfather; CAD in her paternal grandmother and paternal uncle; CAD (age of onset: 75) in her mother; Colon polyps in her brother; Diabetes in her paternal grandfather; Heart disease in her maternal grandmother and paternal grandfather; Hyperlipidemia in her paternal grandmother; Hypertension in her father and paternal grandmother; Lung cancer in her maternal grandfather; Stroke in her paternal grandfather, paternal grandmother, and paternal uncle; Throat cancer in her maternal aunt and maternal uncle.  Laboratory Chemistry Profile   Renal Lab Results  Component Value Date   BUN 11 04/08/2020   CREATININE 0.84 50/38/8828   BCR NOT APPLICABLE 00/34/9179   GFR 76.08 04/08/2020   GFRAA >60 10/19/2016   GFRNONAA >60 10/19/2016    Hepatic Lab Results  Component Value Date   AST 20 04/08/2020   ALT 12 04/08/2020   ALBUMIN 4.3 04/08/2020   ALKPHOS 49 04/08/2020   HCVAB NEGATIVE 02/10/2016    Electrolytes Lab Results  Component Value Date   NA 138 04/08/2020   K 4.3 04/08/2020   CL 100 04/08/2020   CALCIUM 9.7 04/08/2020    Bone Lab Results  Component Value Date   VD25OH 38.44 03/07/2018    Inflammation (CRP: Acute Phase) (ESR: Chronic Phase) Lab Results  Component Value Date   ESRSEDRATE 12 03/13/2019         Note: Above Lab results reviewed.  Physical Exam  General appearance: Well nourished, well developed, and well hydrated. In no apparent acute distress Mental status: Alert, oriented x 3 (person, place, & time)       Respiratory: No  evidence of acute respiratory distress Eyes: PERLA Vitals: BP 129/81   Pulse 70   Temp (!) 97 F (36.1 C) (Temporal)   Resp 18   Ht 5' 4"  (1.626 m)   Wt 129 lb (58.5 kg)   SpO2 100%   BMI 22.14 kg/m  BMI: Estimated body mass index is 22.14 kg/m as calculated from the following:   Height as of this encounter: 5' 4"  (1.626 m).   Weight as of this encounter: 129 lb (58.5 kg). Ideal: Ideal body weight: 54.7 kg (120 lb 9.5 oz) Adjusted ideal body weight: 56.2 kg (123 lb 15.3 oz)  Improved low back and leg pain SCS trial leads removed under live fluoroscopy, tips intact  Assessment   Status Diagnosis  Controlled Controlled Controlled 1. Failed back surgical syndrome   2. Postlaminectomy syndrome, lumbar region   3. Chronic radicular lumbar pain        Plan of Care   Given successful Medtr[employer] spinal cord stimulator trial, recommend permanent Medtronic implant, percutaneous with Dr. Lacinda Axon.  Orders:  Orders Placed This Encounter  Procedures   DG PAIN CLINIC C-ARM 1-60 MIN NO REPORT    Intraoperative interpretation by procedural physician at [facility].    Standing Status:   Standing    Number of Occurrences:   1    Order Specific Question:   Reason for exam:    Answer:   Assistance in needle guidance and placement for procedures requiring needle placement in or near specific anatomical locations not easily accessible without such assistance.   Ambulatory referral to Neurosurgery    Referral Priority:   Routine    Referral Type:  Surgical    Referral Reason:   Specialty Services Required    Referred to Provider:   Deetta Perla, MD    Requested Specialty:   Neurosurgery    Number of Visits Requested:   1   Follow-up plan:   Return if symptoms worsen or fail to improve.    Recent Visits Date Type Provider Dept  04/07/21 Procedure visit Gillis Santa, MD [facility]  03/29/21 Office Visit Gillis Santa, MD [facility]  Showing recent  visits within past 90 days and meeting all other requirements Today's Visits Date Type Provider Dept  04/14/21 Office Visit Gillis Santa, MD [facility]  Showing today's visits and meeting all other requirements Future Appointments No visits were found meeting these conditions. Showing future appointments within next 90 days and meeting all other requirements I discussed the assessment and treatment plan with the patient. The patient was provided an opportunity to ask questions and all were answered. The patient agreed with the plan and demonstrated an understanding of the instructions.  Patient advised to call back or seek an in-person evaluation if the symptoms or condition worsens.  Duration of encounter: 70mnutes.  Note by: BGillis Santa MD Date: 04/14/2021; Time: 1:21 PM

## 2021-04-14 NOTE — Progress Notes (Signed)
Safety precautions to be maintained throughout the outpatient stay will include: orient to surroundings, keep bed in low position, maintain call bell within reach at all times, provide assistance with transfer out of bed and ambulation.  

## 2021-04-20 ENCOUNTER — Other Ambulatory Visit: Payer: BC Managed Care – PPO

## 2021-04-20 DIAGNOSIS — G894 Chronic pain syndrome: Secondary | ICD-10-CM | POA: Diagnosis not present

## 2021-04-21 ENCOUNTER — Ambulatory Visit: Payer: BC Managed Care – PPO | Admitting: Neurology

## 2021-04-21 ENCOUNTER — Encounter: Payer: Self-pay | Admitting: Neurology

## 2021-04-21 ENCOUNTER — Other Ambulatory Visit: Payer: Self-pay | Admitting: Neurosurgery

## 2021-04-21 VITALS — BP 133/79 | HR 68 | Ht 63.0 in | Wt 135.0 lb

## 2021-04-21 DIAGNOSIS — R208 Other disturbances of skin sensation: Secondary | ICD-10-CM | POA: Diagnosis not present

## 2021-04-21 DIAGNOSIS — G373 Acute transverse myelitis in demyelinating disease of central nervous system: Secondary | ICD-10-CM | POA: Diagnosis not present

## 2021-04-21 DIAGNOSIS — R29898 Other symptoms and signs involving the musculoskeletal system: Secondary | ICD-10-CM | POA: Diagnosis not present

## 2021-04-21 DIAGNOSIS — M5412 Radiculopathy, cervical region: Secondary | ICD-10-CM

## 2021-04-21 DIAGNOSIS — G894 Chronic pain syndrome: Secondary | ICD-10-CM

## 2021-04-21 NOTE — Progress Notes (Signed)
[facility]  PATIENT: Elizabeth Long DOB: 1961-05-02  REFERRING DOCTOR OR PCP: Ria Bush MD SOURCE: Patient, notes from primary care, imaging and lab reports, MRI images of the cervical and lumbar spine personally reviewed  _________________________________   HISTORICAL  CHIEF COMPLAINT:  Chief Complaint  Patient presents with   Follow-up    Pt alone, rm 1. Pt states that overall neuropathy has progressed into the lower extremities. Sometimes has to wear loose pants because of the discomfort. She is scheduled to have a spinal stimulator placed in dec    HISTORY OF PRESENT ILLNESS:  Elizabeth Long is a 60 y.o. woman with numbness and gait issues and T2 hyperintense foci i spinal cord and brain.  Update11/16/2022: She will be getting a spinal cord stimulator (Dr. Lacinda Axon (Neurosurgery at [facility])).  She felt better with the SCS trial (80% better).   In preparation, she had an MRI of the T-spine and it does show a focus at T9 (sje has not had T-spie in past to compare) and another in the conus at L1 (old  She has numbness in her feet since 2019 with dysesthesias feeling like sandpaper.  She also has a different dysesthetic sensation near her rectum.    Nortriptyline helped but she became irritable.    Imipramine had helped some at first and then stopped helping as much but still helps some and sleep is better.   Gabapentin is tolerated but less effective.      Currently, gait is fine but balance is mildly off.   She denies weakness.   She has numbness in both legs.   She has incomplete bladder emptying.   She denies any visual disturbances.   She has fatigue.   She has insomnia and takes gabapentin (300 mg qHS) and lorazepam 2 mg and sometimes Benadryl.  She notes depression.   She has more apathy recently.    She is working doing Medical sales representative work.   She is noting that she is more forgetful than before and is no new difficulties with her focus and attention.   History of  neurologic symptoms and subsequent studies: Around 2008, she began to experience symptoms in her neck and arm.  These would fluctuate.  In 2011, an MRI of the cervical spine showed a focus adjacent to C2-C3 towards the left.  At the time, she actually had more symptoms in the right arm.  At some points he had surgery at C5-C6 ([facility]).  She did well and have less symptoms for many years but then began to experience more numbness in the arms and legs.  In 2021, she was noted on MRI to have a T2 hyperintense focus adjacent to L1 and may be a second 1 adjacent to T12 in the spinal cord.  She also had degenerative changes.  CSF 08/21/2019 did not show oligoclonal bands.  Repeat imaging in 2020 one of the cervical spine just showed a 1 focus, diffusion and progressive degenerative changes C4-C5.  MRI of the brain shows only a few T2 hyperintense foci.  I reviewed imaging from 2008, 2011, and 2021.  The focus at C2-C3 appears to have developed between the 2008 and 2011 MRI of the cervical spine.  The focus in the conus medullaris appears to have occurred after the 2013 MRI and before the 2021 MRI.  There is only 1 MRI of the brain.   Studies:  Imaging:  MRI of the cervical spine 03/18/2010.  The cervical spine shows a left  lateral focus at C2-C3 that could be consistent with a demyelinating plaque.  That focus was not present in 2008  MRI of the lumbar spine 07/18/2019  shows a focus adjacent to T12 to the right and possibly 1 within the conus adjacent to L1 to the anterolaterally to the left.   At [facility] disc protrusion could affect left L3 nerve root and at L4L5 protrusion could affect left L5 nerve root.    MRI of the brain and cervical spine 08/13/2019 shows a T2 hyperintense focus within the spinal cord adjacent to C2-C3 which had been seen previously.  There are 2-3 hemispheric small foci, including one juxtacortical one and a focus within the pons.  She has ACDF at C5-C6.  At C4-C5 there is retrolisthesis and  other degenerative changes causing foraminal narrowing but no nerve root compression.  This has progressed compared to the previous MRI from 2011.   MRI brain 10/04/2020 showed  Several T2/FLAIR hyperintense foci in the white matter of the hemispheres and in the right pons.  They do not enhance or appear to be acute.  Compared to the MRI from 08/13/2019, there are no new lesions.  Though the overall pattern is nonspecific, the one focus in the periventricular white matter and the other in the juxtacortical white matter on the left could be consistent with chronic demyelinating plaque associated with MS.  They could also represent age-related chronic microvascular ischemic change.  MRI cervical spine 10/14/2020 showed   T2 hyperintense focus within the spinal cord laterally to the left.  The location could be consistent with demyelination from multiple sclerosis.  It does not enhance and is unchanged compared to the 08/13/2019 MRI.  Another T2 hyperintense focus is noted within the pons, also unchanged. 2.   Remote fusion at C5-C6 3.   At C3-C4, there is 2 mm anterolisthesis and other degenerative changes causing moderately severe right and moderate left foraminal narrowing but no spinal stenosis.  There is potential for right C4 nerve root compression.  This level has slightly progressed compared to the 2021 MRI. 4.   At C4-C5, there are stable degenerative changes including 3 mm retrolisthesis.  There is moderate right foraminal narrowing but no nerve root compression.  MRI thoracic spine 03/25/2021 showed a focus at T9    Lumbar puncture 08/21/2019 did not show any bands in the CSF not present in the serum though there were 3 identical gamma restricted bands in the CSF and serum.  IgG index was normal at 0.53.    REVIEW OF SYSTEMS: Constitutional: No fevers, chills, sweats, or change in appetite.   Insomnia and fatigue Eyes: No visual changes, double vision, eye pain Ear, nose and throat: No hearing  loss, ear pain, nasal congestion, sore throat Cardiovascular: No chest pain, palpitations Respiratory:  No shortness of breath at rest or with exertion.   No wheezes GastrointestinaI: No nausea, vomiting, diarrhea, abdominal pain, fecal incontinence Genitourinary:  No dysuria, urinary retention or frequency.  No nocturia. Musculoskeletal:  Notes neck pain, back pain Integumentary: No rash, pruritus, skin lesions Neurological: as above Psychiatric: Notes depression and anxiety Endocrine: No palpitations, diaphoresis, change in appetite, change in weigh or increased thirst Hematologic/Lymphatic:  No anemia, purpura, petechiae. Allergic/Immunologic: No itchy/runny eyes, nasal congestion, recent allergic reactions, rashes  ALLERGIES: Allergies  Allergen Reactions   Cefotan [Cefotetan Disodium] Swelling   Elemental Sulfur Rash    Rash all over    HOME MEDICATIONS:  Current Outpatient Medications:    Ascorbic Acid (  VITAMIN C) 1000 MG tablet, Take 1,000 mg by mouth daily., Disp: , Rfl:    cetirizine (ZYRTEC) 10 MG tablet, Take 10 mg by mouth daily as needed for allergies., Disp: , Rfl:    escitalopram (LEXAPRO) 5 MG tablet, Take 1 tablet (5 mg total) by mouth daily., Disp: 90 tablet, Rfl: 3   estradiol (ESTRACE) 0.1 MG/GM vaginal cream, Place 1 g vaginally 2 (two) times a week., Disp: , Rfl:    ferrous sulfate 324 (65 Fe) MG TBEC, Take 1 tablet (325 mg total) by mouth daily., Disp: , Rfl:    fluticasone (FLONASE) 50 MCG/ACT nasal spray, Place 2 sprays into both nostrils daily., Disp: 16 g, Rfl: 1   gabapentin (NEURONTIN) 300 MG capsule, Take 1 capsule (300 mg total) by mouth 2 (two) times daily., Disp: 180 capsule, Rfl: 1   hydrochlorothiazide (MICROZIDE) 12.5 MG capsule, TAKE 1 CAPSULE BY MOUTH EVERY DAY, Disp: 90 capsule, Rfl: 2   hydrOXYzine (ATARAX/VISTARIL) 25 MG tablet, TAKE 1 TABLET BY MOUTH EVERYDAY AT BEDTIME, Disp: 90 tablet, Rfl: 1   imipramine (TOFRANIL) 25 MG tablet, TAKE 1  TABLET BY MOUTH EVERYDAY AT BEDTIME, Disp: 90 tablet, Rfl: 3   Lifitegrast (XIIDRA) 5 % SOLN, Xiidra 5 % eye drops in a dropperette  INSTILL 1 DROP INTO EACH EYE TWICE DAILY, Disp: , Rfl:    LORazepam (ATIVAN) 2 MG tablet, Take 0.5 tablets (1 mg total) by mouth at bedtime., Disp: , Rfl: 0   Multiple Vitamins-Minerals (CENTRUM SILVER 50+WOMEN) TABS, Take 1 tablet by mouth daily., Disp: , Rfl:    omeprazole (PRILOSEC) 20 MG capsule, TAKE 1 CAPSULE BY MOUTH EVERY DAY, Disp: 90 capsule, Rfl: 1   traMADol (ULTRAM) 50 MG tablet, Take 50 mg by mouth at bedtime as needed for moderate pain., Disp: , Rfl:    VITAMIN D PO, Take 1 tablet by mouth daily., Disp: , Rfl:    Zinc Sulfate (ZINC 15 PO), Take by mouth., Disp: , Rfl:   Current Facility-Administered Medications:    0.9 %  sodium chloride infusion, 500 mL, Intravenous, Once, Nandigam, Venia Minks, MD  PAST MEDICAL HISTORY: Past Medical History:  Diagnosis Date   Allergic rhinitis    Allergy    Anemia    as a teenager   Anxiety    history of panic attack   Anxiety associated with depression    Chronic headaches    migraines- not any longer   Depression    Essential hypertension 10/24/2015   Family history of adverse reaction to anesthesia    mother - N/V   GERD (gastroesophageal reflux disease)    History of alcoholism ([place])    Sober since 1994 Strong fmhx alcoholism   History of pneumonia    x2   Hyperlipidemia    IBS (irritable bowel syndrome)    Irritable bowel syndrome (IBS)    constipation   Microscopic colitis    Osteoarthritis 2000s   chronic back and shoulder pain s/p surgery   Pneumonia    had x 2 last time age 70   Post menopausal syndrome 10/24/2015   Substance abuse ([place])    alcohol abuse   Tubular adenoma of colon     PAST SURGICAL HISTORY: Past Surgical History:  Procedure Laterality Date   ABDOMINAL HYSTERECTOMY  1990   endometriosis   ANTERIOR CERVICAL DECOMP/DISCECTOMY FUSION  2006   C5/6 HNP ([place])    APPENDECTOMY     CHOLECYSTECTOMY N/A 10/19/2016   Procedure: LAPAROSCOPIC CHOLECYSTECTOMY;  Surgeon: Stark Klein, MD;  Location: [facility];  Service: General;  Laterality: N/A;   COLONOSCOPY  06/2011   TA, microscopic colitis, rpt 5 yrs ([place])   COLONOSCOPY  09/2016   13mm polyp - tubular adenoma rpt 3 yrs (Nandigam)   COLONOSCOPY  10/2019   48mm HP, int hem, rpt 5 yrs (Nandigam)   cyst on ovary     EYE SURGERY Bilateral    [place] SURGERY  2002   [facility]   lumbar esi  2010   NOSE SURGERY     POLYPECTOMY     SHOULDER SURGERY Right    Rotatar Cuff   TONSILLECTOMY      FAMILY HISTORY: Family History  Problem Relation Age of Onset   Arthritis Mother    CAD Mother 33       4v bypass   Arthritis Father    Hypertension Father    Alcohol abuse Brother    Colon polyps Brother    Heart disease Maternal Grandmother        CHF   Lung cancer Maternal Grandfather        (smoker)   Alcohol abuse Paternal Grandmother    Hyperlipidemia Paternal Grandmother    CAD Paternal Grandmother        MI x3   Stroke Paternal Grandmother    Hypertension Paternal Grandmother    Alcohol abuse Paternal Grandfather    Arthritis Paternal Grandfather    Heart disease Paternal Grandfather    Stroke Paternal Grandfather    Diabetes Paternal Grandfather    Throat cancer Maternal Aunt    CAD Paternal Uncle        MI   Stroke Paternal Uncle    Throat cancer Maternal Uncle    Colon cancer Neg Hx    Esophageal cancer Neg Hx    Stomach cancer Neg Hx    Rectal cancer Neg Hx     SOCIAL HISTORY:  Social History   Socioeconomic History   Marital status: Single    Spouse name: Not on file   Number of children: 0   Years of education: Not on file   Highest education level: Not on file  Occupational History   Occupation: CLERK CLASS 1    Employer: [employer]    Comment: full time  Tobacco Use   Smoking status: Former    Years: 30.00    Types: Cigarettes    Quit date:  04/11/2000    Years since quitting: 21.0   Smokeless tobacco: Never  Vaping Use   Vaping Use: Never used  Substance and Sexual Activity   Alcohol use: No    Alcohol/week: 0.0 standard drinks    Comment: hx   Drug use: No   Sexual activity: Yes  Other Topics Concern   Not on file  Social History Narrative   Daily caffeine- coffee (3-4 cups per day)   Right handed    Lives alone   Social Determinants of Health   Financial Resource Strain: Not on file  Food Insecurity: Not on file  Transportation Needs: Not on file  Physical Activity: Not on file  Stress: Not on file  Social Connections: Not on file  Intimate Partner Violence: Not on file     PHYSICAL EXAM  Vitals:   04/21/21 1539  BP: 133/79  Pulse: 68  Weight: 135 lb (61.2 kg)  Height: 5\' 3"  (1.6 m)    Body mass index is 23.91 kg/m.   General: The  patient is well-developed and well-nourished and in no acute distress  HEENT:  Head is [place]/AT.  Sclera are anicteric.   Neck:    The neck is nontender.   Skin: Extremities are without rash or  edema.   Neurologic Exam  Mental status: The patient is alert and oriented x 3 at the time of the examination. The patient has apparent normal recent and remote memory, with an apparently normal attention span and concentration ability.   Speech is normal.  Cranial nerves: Extraocular movements are full.  . There is good facial sensation to soft touch bilaterally.Facial strength is normal.  Trapezius and sternocleidomastoid strength is normal. No dysarthria is noted.   No obvious hearing deficits are noted.  Motor:  Muscle bulk is normal.   Tone is normal. Strength is  5 / 5 in right arm and 4+/5 in triceps and pronators but 5/5 elsewhere in left arm .   Sensory: Sensory testing is variable with reduced touch in legs vs arms.   Vibration is symmetric and same in arms and legs   Coordination: Cerebellar testing reveals good finger-nose-finger and mildly reduced heel-to-shin  bilaterally.  Gait and station: Station is normal.   Gait is mildly wide and tandem is wide.  Romberg is negative.  Reflexes: Deep tendon reflexes are symmetric and normal in arms but increased at knees, left > right. No clonus  Plantar responses are flexor.    DIAGNOSTIC DATA (LABS, IMAGING, TESTING) - I reviewed patient records, labs, notes, testing and imaging myself where available.  Lab Results  Component Value Date   WBC 7.7 11/01/2019   HGB 12.3 11/01/2019   HCT 37.4 11/01/2019   MCV 91.7 11/01/2019   PLT 292 11/01/2019      Component Value Date/Time   NA 138 04/08/2020 0741   NA 139 10/24/2012 0000   K 4.3 04/08/2020 0741   K 4.1 10/24/2012 0000   CL 100 04/08/2020 0741   CO2 32 04/08/2020 0741   GLUCOSE 91 04/08/2020 0741   BUN 11 04/08/2020 0741   CREATININE 0.84 04/08/2020 0741   CREATININE 0.95 11/01/2019 1554   CALCIUM 9.7 04/08/2020 0741   CALCIUM 10.5 10/24/2012 0000   PROT 6.9 04/08/2020 0741   ALBUMIN 4.3 04/08/2020 0741   ALBUMIN 4.1 08/21/2019 0913   AST 20 04/08/2020 0741   ALT 12 04/08/2020 0741   ALKPHOS 49 04/08/2020 0741   BILITOT 0.4 04/08/2020 0741   GFRNONAA >60 10/19/2016 0955   GFRAA >60 10/19/2016 0955   Lab Results  Component Value Date   CHOL 205 (H) 04/08/2020   HDL 69.50 04/08/2020   LDLCALC 118 (H) 04/08/2020   TRIG 88.0 04/08/2020   CHOLHDL 3 04/08/2020   Lab Results  Component Value Date   HGBA1C 5.7 03/07/2018   Lab Results  Component Value Date   VITAMINB12 713 11/01/2019   Lab Results  Component Value Date   TSH 1.58 04/08/2020       ASSESSMENT AND PLAN  Transverse myelitis (HCC)  Right leg weakness  Cervical radiculopathy  Burning sensation of feet  Chronic pain syndrome   1.   She has foci in her spinal cord - recent Tspine showed one at T9 we were unaware of (had not had T-spine in past ) and at L1 (old).   This is most likely associated with a mild MS.  Since there has been no changes on MRI  over the past 2 years, we will hold off on treatment but  I would reevaluate the MRIs in about 2 years, 2024 2.   Imipramine for her dysesthetic pain.  Of note, she had a benefit with nortriptyline but had trouble tolerating it due to personality changes. 3.   Return in 12 months or sooner if there are new or worsening neurologic symptoms.  Caren Garske A. Felecia Shelling, MD, [facility] [id number], 6:81 PM Certified in Neurology, Clinical Neurophysiology, Sleep Medicine and Neuroimaging  [facility] [address] [place], [place] [address] [phone]

## 2021-04-22 ENCOUNTER — Encounter: Payer: Self-pay | Admitting: Family Medicine

## 2021-04-22 DIAGNOSIS — R208 Other disturbances of skin sensation: Secondary | ICD-10-CM

## 2021-04-22 DIAGNOSIS — G373 Acute transverse myelitis in demyelinating disease of central nervous system: Secondary | ICD-10-CM

## 2021-04-22 DIAGNOSIS — I10 Essential (primary) hypertension: Secondary | ICD-10-CM

## 2021-04-22 NOTE — Telephone Encounter (Signed)
Pt scheduled for CPE on 04/26/21 with same day labs.

## 2021-04-26 ENCOUNTER — Ambulatory Visit (INDEPENDENT_AMBULATORY_CARE_PROVIDER_SITE_OTHER): Payer: BC Managed Care – PPO | Admitting: Family Medicine

## 2021-04-26 ENCOUNTER — Encounter: Payer: Self-pay | Admitting: Family Medicine

## 2021-04-26 ENCOUNTER — Other Ambulatory Visit: Payer: Self-pay

## 2021-04-26 VITALS — BP 120/80 | HR 69 | Temp 97.6°F | Ht 64.5 in | Wt 135.2 lb

## 2021-04-26 DIAGNOSIS — Z7189 Other specified counseling: Secondary | ICD-10-CM

## 2021-04-26 DIAGNOSIS — E041 Nontoxic single thyroid nodule: Secondary | ICD-10-CM

## 2021-04-26 DIAGNOSIS — G4700 Insomnia, unspecified: Secondary | ICD-10-CM

## 2021-04-26 DIAGNOSIS — Z87891 Personal history of nicotine dependence: Secondary | ICD-10-CM

## 2021-04-26 DIAGNOSIS — G373 Acute transverse myelitis in demyelinating disease of central nervous system: Secondary | ICD-10-CM | POA: Diagnosis not present

## 2021-04-26 DIAGNOSIS — M961 Postlaminectomy syndrome, not elsewhere classified: Secondary | ICD-10-CM

## 2021-04-26 DIAGNOSIS — F418 Other specified anxiety disorders: Secondary | ICD-10-CM

## 2021-04-26 DIAGNOSIS — G894 Chronic pain syndrome: Secondary | ICD-10-CM

## 2021-04-26 DIAGNOSIS — Z0001 Encounter for general adult medical examination with abnormal findings: Secondary | ICD-10-CM | POA: Diagnosis not present

## 2021-04-26 DIAGNOSIS — R208 Other disturbances of skin sensation: Secondary | ICD-10-CM | POA: Diagnosis not present

## 2021-04-26 DIAGNOSIS — I10 Essential (primary) hypertension: Secondary | ICD-10-CM

## 2021-04-26 DIAGNOSIS — M545 Low back pain, unspecified: Secondary | ICD-10-CM

## 2021-04-26 DIAGNOSIS — G8929 Other chronic pain: Secondary | ICD-10-CM

## 2021-04-26 DIAGNOSIS — K219 Gastro-esophageal reflux disease without esophagitis: Secondary | ICD-10-CM

## 2021-04-26 DIAGNOSIS — Z23 Encounter for immunization: Secondary | ICD-10-CM

## 2021-04-26 NOTE — Assessment & Plan Note (Signed)
Appreciate neurosurgery and PM&R care.  Upcoming spine stimulator implant.

## 2021-04-26 NOTE — Assessment & Plan Note (Signed)
Chronic, stable period on low dose omeprazole

## 2021-04-26 NOTE — Progress Notes (Signed)
Patient ID: Elizabeth Long, female    DOB: 1961/05/22, 60 y.o.   MRN: [id number]  This visit was conducted in person.  BP 120/80   Pulse 69   Temp 97.6 F (36.4 C) (Temporal)   Ht 5' 4.5" (1.638 m)   Wt 135 lb 3 oz (61.3 kg)   SpO2 97%   BMI 22.85 kg/m    CC: CPE Subjective:   HPI: Elizabeth Long is a 60 y.o. female presenting on 04/26/2021 for Annual Exam (Wants to discuss Dr. Darnell Level taking over lorazepam rx. )   MS/transverse myelitis - saw Dr Felecia Shelling neurology last week, note reviewed. Spinal cord foci on recent MRI. Notes ongoing imbalance.  Taking imipramine for dysesthesias. Previously did not tolerate nortriptyline.  Planning spinal cord stimulator (Dr Lacinda Axon) - she is excited for this.  Also sees Dr Holley Raring [facility] pain clinic.  Has started going to gym regularly.   Long term on lorazepam 1mg  for sleep. Significant insomnia if she misses dose. Requests we take over prescription. Discussed setting up controlled substance.   Notes some intermittent swelling to left lower abdomen noted over the past month. No pain or bowel changes.   Preventative: Colonoscopy 09/2016 - 1mm TA, rpt 3 yrs (Nandigam)  Colonoscopy 10/2019 - 66mm HP, int hem, rpt 5 yrs (Nandigam)  Well woman with OBGYN Dr Stann Mainland upcoming appt 05/2021. s/p hysterectomy (endometriosis). Ovaries remain.  Breast cancer screening - mammogram yearly with OBGYN Lung cancer screening - not eligible  Flu shot yearly  [place] 11/2019 x2, booster x2 06/2020, 12/2020 [place] shot - will discuss Tdap 02/2016  Shingrix - 04/2020, 10/2020 Advanced directive - has living will at home. Brother Elizabeth Long would be HCPOA. Wouldn't want prolonged life support if terminal condition. Asked to bring [place] a copy.  Seat belt use discussed  Sunscreen use discussed. No changing moles on skin.  Ex smoker - quit 04/1999. Prior 2 ppd. 20+ PY hx.  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 Interim medical history since our last visit reviewed. Allergies and medications reviewed and updated. Outpatient Medications Prior to Visit  Medication Sig Dispense Refill   cetirizine (ZYRTEC) 10 MG tablet Take 10 mg by mouth daily as needed for allergies.     escitalopram (LEXAPRO) 5 MG tablet Take 1 tablet (5 mg total) by mouth daily. 90 tablet 3   estradiol (ESTRACE) 0.1 MG/GM vaginal cream Place 1 g vaginally 2 (two) times a week.     ferrous sulfate 324 (65 Fe) MG TBEC Take 1 tablet (325 mg total) by mouth daily.     fluticasone (FLONASE) 50 MCG/ACT nasal spray Place 2 sprays into both nostrils daily. 16 g 1   gabapentin (NEURONTIN) 300 MG capsule Take 1 capsule (300 mg total) by mouth 2 (two) times daily. 180 capsule 1   hydrochlorothiazide (MICROZIDE) 12.5 MG capsule TAKE 1 CAPSULE BY MOUTH EVERY DAY 90 capsule 2   hydrOXYzine (ATARAX/VISTARIL) 25 MG tablet TAKE 1 TABLET BY MOUTH EVERYDAY AT BEDTIME 90 tablet 1   imipramine (TOFRANIL) 25 MG tablet TAKE 1 TABLET BY MOUTH  EVERYDAY AT BEDTIME 90 tablet 3   LORazepam (ATIVAN) 1 MG tablet Take 1 tablet (1 mg total) by mouth at bedtime.     Multiple Vitamins-Minerals (CENTRUM SILVER 50+WOMEN) TABS Take 1 tablet by mouth daily.     omeprazole (PRILOSEC) 20 MG capsule TAKE 1 CAPSULE BY MOUTH EVERY DAY 90 capsule 1   traMADol (ULTRAM) 50 MG tablet Take 50 mg by mouth at bedtime as needed for moderate pain.     vitamin C (ASCORBIC ACID) 500 MG tablet Take 500 mg by mouth daily.     VITAMIN D PO Take 1 tablet by mouth daily.     Zinc Sulfate (ZINC 15 PO) Take by mouth.     LORazepam (ATIVAN) 2 MG tablet Take 0.5 tablets (1  mg total) by mouth at bedtime.  0   Lifitegrast (XIIDRA) 5 % SOLN Xiidra 5 % eye drops in a dropperette  INSTILL 1 DROP INTO EACH EYE TWICE DAILY (Patient not taking: Reported on 04/26/2021)     Ascorbic Acid (VITAMIN C) 1000 MG tablet Take 1,000 mg by mouth daily.     Facility-Administered Medications Prior to Visit  Medication Dose Route Frequency Provider Last Rate Last Admin   0.9 %  sodium chloride infusion  500 mL Intravenous Once Nandigam, Kavitha V, MD         Per HPI unless specifically indicated in ROS section below Review of Systems  Constitutional:  Negative for activity change, appetite change, chills, fatigue, fever and unexpected weight change.  HENT:  Negative for hearing loss.   Eyes:  Negative for visual disturbance.  Respiratory:  Negative for cough, chest tightness, shortness of breath and wheezing.   Cardiovascular:  Negative for chest pain, palpitations and leg swelling.  Gastrointestinal:  Negative for abdominal distention, abdominal pain, blood in stool, constipation, diarrhea, nausea and vomiting.  Genitourinary:  Negative for difficulty urinating and hematuria.  Musculoskeletal:  Negative for arthralgias, myalgias and neck pain.  Skin:  Negative for rash.  Neurological:  Positive for headaches. Negative for dizziness, seizures and syncope.  Hematological:  Negative for adenopathy. Does not bruise/bleed easily.  Psychiatric/Behavioral:  Negative for dysphoric mood. The patient is not nervous/anxious.    Objective:  BP 120/80   Pulse 69   Temp 97.6 F (36.4 C) (Temporal)   Ht 5' 4.5" (1.638 m)   Wt 135 lb 3 oz (61.3 kg)   SpO2 97%   BMI 22.85 kg/m   Wt Readings from Last 3 Encounters:  04/26/21 135 lb 3 oz (61.3 kg)  04/21/21 135 lb (61.2 kg)  04/14/21 129 lb (58.5 kg)      Physical Exam Vitals and nursing note reviewed.  Constitutional:      Appearance: Normal appearance. She is not ill-appearing.  HENT:     Head: Normocephalic and atraumatic.      Right Ear: Tympanic membrane, ear canal and external ear normal. There is no impacted cerumen.     Left Ear: Tympanic membrane, ear canal and external ear normal. There is no impacted cerumen.  Eyes:     General:        Right eye: No discharge.        Left eye: No discharge.     Extraocular Movements: Extraocular movements intact.     Conjunctiva/sclera: Conjunctivae normal.     Pupils: Pupils are equal, round, and reactive to light.  Neck:     Thyroid: No thyroid mass or thyromegaly.     Vascular: No carotid  bruit.  Cardiovascular:     Rate and Rhythm: Normal rate and regular rhythm.     Pulses: Normal pulses.     Heart sounds: Normal heart sounds. No murmur heard. Pulmonary:     Effort: Pulmonary effort is normal. No respiratory distress.     Breath sounds: Normal breath sounds. No wheezing, rhonchi or rales.  Abdominal:     General: Bowel sounds are normal. There is no distension.     Palpations: Abdomen is soft. There is no mass.     Tenderness: There is no abdominal tenderness. There is no guarding or rebound.     Hernia: No hernia is present.     Comments: No obvious hernia appreciated  Musculoskeletal:     Cervical back: Normal range of motion and neck supple. No rigidity.     Right lower leg: No edema.     Left lower leg: No edema.  Lymphadenopathy:     Cervical: No cervical adenopathy.  Skin:    General: Skin is warm and dry.     Findings: No rash.  Neurological:     General: No focal deficit present.     Mental Status: She is alert. Mental status is at baseline.  Psychiatric:        Mood and Affect: Mood normal.        Behavior: Behavior normal.      Results for orders placed or performed in visit on 06/04/20  HM MAMMOGRAPHY  Result Value Ref Range   HM Mammogram 0-4 Bi-Rad 0-4 Bi-Rad, Self Reported Normal    Assessment & Plan:  This visit occurred during the SARS-CoV-2 public health emergency.  Safety protocols were in place, including screening questions  prior to the visit, additional usage of staff PPE, and extensive cleaning of exam room while observing appropriate contact time as indicated for disinfecting solutions.   Problem List Items Addressed This Visit     Encounter for general adult medical examination with abnormal findings - Primary (Chronic)    Preventative protocols reviewed and updated unless pt declined. Discussed healthy diet and lifestyle.       Advanced directives, counseling/discussion (Chronic)    Advanced directive - has living will at home. Brother Aija Scarfo would be HCPOA. Wouldn't want prolonged life support if terminal condition. Asked to bring [place] a copy.       Anxiety associated with depression    Continues lexapro 5mg  with hydroxyzine prn.       Relevant Medications   LORazepam (ATIVAN) 1 MG tablet   GERD (gastroesophageal reflux disease)    Chronic, stable period on low dose omeprazole       Essential hypertension    Chronic, stable on low dosehctz.       Chronic lower back pain    Appreciate neurosurgery and PM&R care.  Upcoming spine stimulator implant.       Ex-smoker    Congratulated on ongoing abstinence.       Transverse myelitis ([place])    TM vs early MS - closely followed by neurology Felecia Shelling).       Burning sensation of feet   Thyroid nodule    Recommend yearly thyroid ultrasound for 5 yrs, first imaged 12/2019.       Postlaminectomy syndrome, lumbar region   Chronic pain syndrome   Insomnia    Long term on lorazepam 1mg  nightly for sleep.  Initially started by GYN.  Requests we take over this Rx. Discussed this is controlled substance with addiction,  abuse potential and need to monitor for development of dependence/tolerance.  We can take over - she will let us know when running low. Reviewed completing controlled substance agreement at next visit.       Other Visit Diagnoses     Need for influenza vaccination       Relevant Orders   Flu Vaccine QUAD 62mo+IM (Fluarix,  Fluzone & Alfiuria Quad PF) (Completed)        No orders of the defined types were placed in this encounter.  Orders Placed This Encounter  Procedures   Flu Vaccine QUAD 67mo+IM (Fluarix, Fluzone & Alfiuria Quad PF)     Patient instructions: Labs today  Flu shot today  Bring [place] a copy of your advanced directives to update your chart.  You are doing well today Return as needed or in 6 months for follow up visit.   Follow up plan: Return in about 6 months (around 10/24/2021) for follow up visit.  Ria Bush, MD

## 2021-04-26 NOTE — Telephone Encounter (Signed)
Labs ordered.

## 2021-04-26 NOTE — Patient Instructions (Addendum)
Labs today  Flu shot today  Bring [place] a copy of your advanced directives to update your chart.  You are doing well today Return as needed or in 6 months for follow up visit.   Health Maintenance for Postmenopausal Women Menopause is a normal process in which your ability to get pregnant comes to an end. This process happens slowly over many months or years, usually between the ages of 62 and 23. Menopause is complete when you have missed your menstrual period for 12 months. It is important to talk with your health care provider about some of the most common conditions that affect women after menopause (postmenopausal women). These include heart disease, cancer, and bone loss (osteoporosis). Adopting a healthy lifestyle and getting preventive care can help to promote your health and wellness. The actions you take can also lower your chances of developing some of these common conditions. What are the signs and symptoms of menopause? During menopause, you may have the following symptoms: Hot flashes. These can be moderate or severe. Night sweats. Decrease in sex drive. Mood swings. Headaches. Tiredness (fatigue). Irritability. Memory problems. Problems falling asleep or staying asleep. Talk with your health care provider about treatment options for your symptoms. Do I need hormone replacement therapy? Hormone replacement therapy is effective in treating symptoms that are caused by menopause, such as hot flashes and night sweats. Hormone replacement carries certain risks, especially as you become older. If you are thinking about using estrogen or estrogen with progestin, discuss the benefits and risks with your health care provider. How can I reduce my risk for heart disease and stroke? The risk of heart disease, heart attack, and stroke increases as you age. One of the causes may be a change in the body's hormones during menopause. This can affect how your body uses dietary fats, triglycerides,  and cholesterol. Heart attack and stroke are medical emergencies. There are many things that you can do to help prevent heart disease and stroke. Watch your blood pressure High blood pressure causes heart disease and increases the risk of stroke. This is more likely to develop in people who have high blood pressure readings or are overweight. Have your blood pressure checked: Every 3-5 years if you are 9-41 years of age. Every year if you are 59 years old or older. Eat a healthy diet  Eat a diet that includes plenty of vegetables, fruits, low-fat dairy products, and lean protein. Do not eat a lot of foods that are high in solid fats, added sugars, or sodium. Get regular exercise Get regular exercise. This is one of the most important things you can do for your health. Most adults should: Try to exercise for at least 150 minutes each week. The exercise should increase your heart rate and make you sweat (moderate-intensity exercise). Try to do strengthening exercises at least twice each week. Do these in addition to the moderate-intensity exercise. Spend less time sitting. Even light physical activity can be beneficial. Other tips Work with your health care provider to achieve or maintain a healthy weight. Do not use any products that contain nicotine or tobacco. These products include cigarettes, chewing tobacco, and vaping devices, such as e-cigarettes. If you need help quitting, ask your health care provider. Know your numbers. Ask your health care provider to check your cholesterol and your blood sugar (glucose). Continue to have your blood tested as directed by your health care provider. Do I need screening for cancer? Depending on your health history and family  history, you may need to have cancer screenings at different stages of your life. This may include screening for: Breast cancer. Cervical cancer. Lung cancer. Colorectal cancer. What is my risk for osteoporosis? After menopause,  you may be at increased risk for osteoporosis. Osteoporosis is a condition in which bone destruction happens more quickly than new bone creation. To help prevent osteoporosis or the bone fractures that can happen because of osteoporosis, you may take the following actions: If you are 48-85 years old, get at least 1,000 mg of calcium and at least 600 international units (IU) of vitamin D per day. If you are older than age 40 but younger than age 71, get at least 1,200 mg of calcium and at least 600 international units (IU) of vitamin D per day. If you are older than age 80, get at least 1,200 mg of calcium and at least 800 international units (IU) of vitamin D per day. Smoking and drinking excessive alcohol increase the risk of osteoporosis. Eat foods that are rich in calcium and vitamin D, and do weight-bearing exercises several times each week as directed by your health care provider. How does menopause affect my mental health? Depression may occur at any age, but it is more common as you become older. Common symptoms of depression include: Feeling depressed. Changes in sleep patterns. Changes in appetite or eating patterns. Feeling an overall lack of motivation or enjoyment of activities that you previously enjoyed. Frequent crying spells. Talk with your health care provider if you think that you are experiencing any of these symptoms. General instructions See your health care provider for regular wellness exams and vaccines. This may include: Scheduling regular health, dental, and eye exams. Getting and maintaining your vaccines. These include: Influenza vaccine. Get this vaccine each year before the flu season begins. Pneumonia vaccine. Shingles vaccine. Tetanus, diphtheria, and pertussis (Tdap) booster vaccine. Your health care provider may also recommend other immunizations. Tell your health care provider if you have ever been abused or do not feel safe at home. Summary Menopause is a  normal process in which your ability to get pregnant comes to an end. This condition causes hot flashes, night sweats, decreased interest in sex, mood swings, headaches, or lack of sleep. Treatment for this condition may include hormone replacement therapy. Take actions to keep yourself healthy, including exercising regularly, eating a healthy diet, watching your weight, and checking your blood pressure and blood sugar levels. Get screened for cancer and depression. Make sure that you are up to date with all your vaccines. This information is not intended to replace advice given to you by your health care provider. Make sure you discuss any questions you have with your health care provider. Document Revised: 10/12/2020 Document Reviewed: 10/12/2020 Elsevier Patient Education  [place].

## 2021-04-26 NOTE — Assessment & Plan Note (Signed)
Long term on lorazepam 1mg  nightly for sleep.  Initially started by GYN.  Requests we take over this Rx. Discussed this is controlled substance with addiction, abuse potential and need to monitor for development of dependence/tolerance.  We can take over - she will let us know when running low. Reviewed completing controlled substance agreement at next visit.

## 2021-04-26 NOTE — Addendum Note (Signed)
Addended by: Ria Bush on: 04/26/2021 08:08 AM   Modules accepted: Orders

## 2021-04-26 NOTE — Assessment & Plan Note (Signed)
Preventative protocols reviewed and updated unless pt declined. Discussed healthy diet and lifestyle.  

## 2021-04-26 NOTE — Assessment & Plan Note (Signed)
TM vs early MS - closely followed by neurology Felecia Shelling).

## 2021-04-26 NOTE — Assessment & Plan Note (Addendum)
Chronic, stable on low dosehctz.

## 2021-04-26 NOTE — Assessment & Plan Note (Signed)
Continues lexapro 5mg  with hydroxyzine prn.

## 2021-04-26 NOTE — Assessment & Plan Note (Signed)
Congratulated on ongoing abstinence.

## 2021-04-26 NOTE — Assessment & Plan Note (Signed)
Advanced directive - has living will at home. Brother Abraham Margulies would be HCPOA. Wouldn't want prolonged life support if terminal condition. Asked to bring [place] a copy.

## 2021-04-26 NOTE — Assessment & Plan Note (Addendum)
Recommend yearly thyroid ultrasound for 5 yrs, first imaged 12/2019.

## 2021-04-27 LAB — CBC WITH DIFFERENTIAL/PLATELET
Basophils Absolute: 0.1 10*3/uL (ref 0.0–0.1)
Basophils Relative: 0.9 % (ref 0.0–3.0)
Eosinophils Absolute: 0.1 10*3/uL (ref 0.0–0.7)
Eosinophils Relative: 1.3 % (ref 0.0–5.0)
HCT: 41 % (ref 36.0–46.0)
Hemoglobin: 13.8 g/dL (ref 12.0–15.0)
Lymphocytes Relative: 34.7 % (ref 12.0–46.0)
Lymphs Abs: 2.5 10*3/uL (ref 0.7–4.0)
MCHC: 33.7 g/dL (ref 30.0–36.0)
MCV: 91.8 fl (ref 78.0–100.0)
Monocytes Absolute: 0.5 10*3/uL (ref 0.1–1.0)
Monocytes Relative: 6.9 % (ref 3.0–12.0)
Neutro Abs: 4 10*3/uL (ref 1.4–7.7)
Neutrophils Relative %: 56.2 % (ref 43.0–77.0)
Platelets: 308 10*3/uL (ref 150.0–400.0)
RBC: 4.47 Mil/uL (ref 3.87–5.11)
RDW: 12.6 % (ref 11.5–15.5)
WBC: 7.2 10*3/uL (ref 4.0–10.5)

## 2021-04-27 LAB — LIPID PANEL
Cholesterol: 201 mg/dL — ABNORMAL HIGH (ref 0–200)
HDL: 78 mg/dL (ref >39.00)
LDL Cholesterol: 103 mg/dL — ABNORMAL HIGH (ref 0–99)
NonHDL: 123.03
Total CHOL/HDL Ratio: 3
Triglycerides: 102 mg/dL (ref 0.0–149.0)
VLDL: 20.4 mg/dL (ref 0.0–40.0)

## 2021-04-27 LAB — BASIC METABOLIC PANEL
BUN: 11 mg/dL (ref 6–23)
CO2: 33 mEq/L — ABNORMAL HIGH (ref 19–32)
Calcium: 9.7 mg/dL (ref 8.4–10.5)
Chloride: 96 mEq/L (ref 96–112)
Creatinine, Ser: 0.87 mg/dL (ref 0.40–1.20)
GFR: 72.41 mL/min (ref >60.00)
Glucose, Bld: 98 mg/dL (ref 70–99)
Potassium: 3.5 mEq/L (ref 3.5–5.1)
Sodium: 138 mEq/L (ref 135–145)

## 2021-04-27 LAB — MICROALBUMIN / CREATININE URINE RATIO
Creatinine,U: 40.9 mg/dL
Microalb Creat Ratio: 1.7 mg/g (ref 0.0–30.0)
Microalb, Ur: <0.7 mg/dL (ref 0.0–1.9)

## 2021-04-27 LAB — VITAMIN B12: Vitamin B-12: 939 pg/mL — ABNORMAL HIGH (ref 211–911)

## 2021-04-27 LAB — TSH: TSH: 1.3 u[IU]/mL (ref 0.35–5.50)

## 2021-05-04 ENCOUNTER — Encounter: Payer: Self-pay | Admitting: Family Medicine

## 2021-05-10 ENCOUNTER — Encounter
Admission: RE | Admit: 2021-05-10 | Discharge: 2021-05-10 | Disposition: A | Discharge status: Home / Self Care | Payer: BC Managed Care – PPO | Source: Ambulatory Visit | Attending: Neurosurgery | Admitting: Neurosurgery

## 2021-05-10 ENCOUNTER — Other Ambulatory Visit: Payer: Self-pay

## 2021-05-10 DIAGNOSIS — Z01810 Encounter for preprocedural cardiovascular examination: Secondary | ICD-10-CM | POA: Diagnosis not present

## 2021-05-10 DIAGNOSIS — Z01818 Encounter for other preprocedural examination: Secondary | ICD-10-CM | POA: Diagnosis not present

## 2021-05-10 LAB — PROTIME-INR
INR: 1 (ref 0.8–1.2)
Prothrombin Time: 13.1 seconds (ref 11.4–15.2)

## 2021-05-10 LAB — URINALYSIS, ROUTINE W REFLEX MICROSCOPIC
Bilirubin Urine: NEGATIVE
Glucose, UA: NEGATIVE mg/dL
Hgb urine dipstick: NEGATIVE
Ketones, ur: NEGATIVE mg/dL
Leukocytes,Ua: NEGATIVE
Nitrite: NEGATIVE
Protein, ur: NEGATIVE mg/dL
Specific Gravity, Urine: <1.005 — ABNORMAL LOW (ref 1.005–1.030)
pH: 6.5 (ref 5.0–8.0)

## 2021-05-10 LAB — SURGICAL PCR SCREEN
MRSA, PCR: NEGATIVE
Staphylococcus aureus: NEGATIVE

## 2021-05-10 LAB — APTT: aPTT: 38 seconds — ABNORMAL HIGH (ref 24–36)

## 2021-05-10 NOTE — Patient Instructions (Addendum)
Your procedure is scheduled on: Monday 05/24/21 Report to the Registration Desk on the 1st floor of the [facility]. To find out your arrival time, please call [phone] between 1PM - 3PM on: Friday 05/21/21  REMEMBER: Instructions that are not followed completely may result in serious medical risk, up to and including death; or upon the discretion of your surgeon and anesthesiologist your surgery may need to be rescheduled.  Do not eat food after midnight the night before surgery.  No gum chewing, lozengers or hard candies.  You may however, drink CLEAR liquids up to 2 hours before you are scheduled to arrive for your surgery. Do not drink anything within 2 hours of your scheduled arrival time.  Clear liquids include: - water  - apple juice without pulp - gatorade (not RED, PURPLE, OR BLUE) - black coffee or tea (Do NOT add milk or creamers to the coffee or tea) Do NOT drink anything that is not on this list.  TAKE THESE MEDICATIONS THE MORNING OF SURGERY WITH A SIP OF WATER: omeprazole (PRILOSEC) 20 MG capsule (take one the night before and one on the morning of surgery - helps to prevent nausea after surgery.)  One week prior to surgery: Stop Anti-inflammatories (NSAIDS) such as Advil, Aleve, Ibuprofen, Motrin, Naproxen, Naprosyn and Aspirin based products such as Excedrin, Goodys Powder, BC Powder. Stop taking your Cholecalciferol (VITAMIN D3) 250 MCG (10000 UT) capsule, COLLAGEN PO, Multiple Vitamins-Minerals (CENTRUM SILVER 50+WOMEN) TABS, vitamin C (ASCORBIC ACID) 500 MG tablet, Zinc Sulfate (ZINC 15 PO) and ANY other OVER THE COUNTER supplements until after surgery.  You may however, continue to take Tylenol if needed for pain up until the day of surgery.  No Alcohol for 24 hours before or after surgery.  No Smoking including e-cigarettes for 24 hours prior to surgery.  No chewable tobacco products for at least 6 hours prior to surgery.  No nicotine patches on the day of  surgery.  Do not use any "recreational" drugs for at least a week prior to your surgery.  Please be advised that the combination of cocaine and anesthesia may have negative outcomes, up to and including death. If you test positive for cocaine, your surgery will be cancelled.  On the morning of surgery brush your teeth with toothpaste and water, you may rinse your mouth with mouthwash if you wish. Do not swallow any toothpaste or mouthwash.  Use CHG Soap as directed on instruction sheet.  Do not wear jewelry, make-up, hairpins, clips or nail polish.  Do not wear lotions, powders, or perfumes.   Do not shave body from the neck down 48 hours prior to surgery just in case you cut yourself which could leave a site for infection.  Also, freshly shaved skin may become irritated if using the CHG soap.  Do not bring valuables to the hospital. [facility] is not responsible for any missing/lost belongings or valuables.   Notify your doctor if there is any change in your medical condition (cold, fever, infection).  Wear comfortable clothing (specific to your surgery type) to the hospital.  After surgery, you can help prevent lung complications by doing breathing exercises.  Take deep breaths and cough every 1-2 hours.   Please have somebody stay with you for 24 hours after the procedure.  If you are taking public transportation, you will need to have a responsible adult (18 years or older) with you. Please confirm with your physician that it is acceptable to use public transportation.  Please call the [place] at [phone] if you have any questions about these instructions.  Surgery Visitation Policy:  Patients undergoing a surgery or procedure may have one family member or support person with them as long as that person is not COVID-19 positive or experiencing its symptoms.  That person may remain in the waiting area during the procedure and may rotate out with  other people.  Inpatient Visitation:    Visiting hours are 7 a.m. to 8 p.m. Up to two visitors ages 16+ are allowed at one time in a patient room. The visitors may rotate out with other people during the day. Visitors must check out when they leave, or other visitors will not be allowed. One designated support person may remain overnight. The visitor must pass COVID-19 screenings, use hand sanitizer when entering and exiting the patient's room and wear a mask at all times, including in the patient's room. Patients must also wear a mask when staff or their visitor are in the room. Masking is required regardless of vaccination status.

## 2021-05-18 ENCOUNTER — Other Ambulatory Visit: Payer: Self-pay | Admitting: Family Medicine

## 2021-05-21 LAB — TYPE AND SCREEN
ABO/RH(D): A POS
Antibody Screen: NEGATIVE

## 2021-05-22 ENCOUNTER — Encounter: Payer: Self-pay | Admitting: Family Medicine

## 2021-05-24 ENCOUNTER — Ambulatory Visit: Payer: BC Managed Care – PPO

## 2021-05-24 ENCOUNTER — Ambulatory Visit: Payer: BC Managed Care – PPO | Admitting: Registered Nurse

## 2021-05-24 ENCOUNTER — Ambulatory Visit: Payer: BC Managed Care – PPO | Admitting: Urgent Care

## 2021-05-24 ENCOUNTER — Other Ambulatory Visit: Payer: Self-pay

## 2021-05-24 ENCOUNTER — Ambulatory Visit
Admission: RE | Admit: 2021-05-24 | Discharge: 2021-05-24 | Disposition: A | Discharge status: Home / Self Care | Payer: BC Managed Care – PPO | Attending: Neurosurgery | Admitting: Neurosurgery

## 2021-05-24 ENCOUNTER — Encounter: Payer: Self-pay | Admitting: Neurosurgery

## 2021-05-24 ENCOUNTER — Encounter
Admission: RE | Disposition: A | Discharge status: Home / Self Care | Payer: Self-pay | Source: Home / Self Care | Attending: Neurosurgery

## 2021-05-24 DIAGNOSIS — J309 Allergic rhinitis, unspecified: Secondary | ICD-10-CM | POA: Diagnosis not present

## 2021-05-24 DIAGNOSIS — I10 Essential (primary) hypertension: Secondary | ICD-10-CM | POA: Insufficient documentation

## 2021-05-24 DIAGNOSIS — F418 Other specified anxiety disorders: Secondary | ICD-10-CM | POA: Insufficient documentation

## 2021-05-24 DIAGNOSIS — G894 Chronic pain syndrome: Secondary | ICD-10-CM | POA: Diagnosis not present

## 2021-05-24 DIAGNOSIS — D649 Anemia, unspecified: Secondary | ICD-10-CM | POA: Diagnosis not present

## 2021-05-24 DIAGNOSIS — Z79899 Other long term (current) drug therapy: Secondary | ICD-10-CM | POA: Diagnosis not present

## 2021-05-24 DIAGNOSIS — K219 Gastro-esophageal reflux disease without esophagitis: Secondary | ICD-10-CM | POA: Diagnosis not present

## 2021-05-24 DIAGNOSIS — Z87891 Personal history of nicotine dependence: Secondary | ICD-10-CM | POA: Diagnosis not present

## 2021-05-24 DIAGNOSIS — Z9889 Other specified postprocedural states: Secondary | ICD-10-CM | POA: Diagnosis not present

## 2021-05-24 HISTORY — PX: THORACIC LAMINECTOMY FOR SPINAL CORD STIMULATOR: SHX6887

## 2021-05-24 LAB — ABO/RH: ABO/RH(D): A POS

## 2021-05-24 SURGERY — THORACIC LAMINECTOMY FOR SPINAL CORD STIMULATOR
Anesthesia: General | Site: Thoracic

## 2021-05-24 MED ORDER — DEXAMETHASONE SODIUM PHOSPHATE 10 MG/ML IJ SOLN
INTRAMUSCULAR | Status: DC | PRN
  Administered 2021-05-24: 10 mg via INTRAVENOUS

## 2021-05-24 MED ORDER — LACTATED RINGERS IV SOLN: INTRAVENOUS | Status: DC

## 2021-05-24 MED ORDER — LIDOCAINE HCL (CARDIAC) PF 100 MG/5ML IV SOSY
PREFILLED_SYRINGE | INTRAVENOUS | Status: DC | PRN
  Administered 2021-05-24: 20 mg via INTRAVENOUS
  Administered 2021-05-24: 60 mg via INTRAVENOUS

## 2021-05-24 MED ORDER — FENTANYL CITRATE (PF) 100 MCG/2ML IJ SOLN
INTRAMUSCULAR | Status: DC | PRN
  Administered 2021-05-24: 25 ug via INTRAVENOUS
  Administered 2021-05-24: 50 ug via INTRAVENOUS

## 2021-05-24 MED ORDER — ROCURONIUM BROMIDE 10 MG/ML (PF) SYRINGE
PREFILLED_SYRINGE | INTRAVENOUS | Status: AC
  Filled 2021-05-24: qty 10

## 2021-05-24 MED ORDER — SUCCINYLCHOLINE CHLORIDE 200 MG/10ML IV SOSY
PREFILLED_SYRINGE | INTRAVENOUS | Status: DC | PRN
  Administered 2021-05-24: 100 mg via INTRAVENOUS

## 2021-05-24 MED ORDER — FENTANYL CITRATE (PF) 100 MCG/2ML IJ SOLN
25.0000 ug | INTRAMUSCULAR | Status: DC | PRN
  Administered 2021-05-24 (×2): 25 ug via INTRAVENOUS

## 2021-05-24 MED ORDER — PHENYLEPHRINE HCL-NACL 20-0.9 MG/250ML-% IV SOLN
INTRAVENOUS | Status: AC
  Filled 2021-05-24: qty 250

## 2021-05-24 MED ORDER — MIDAZOLAM HCL 2 MG/2ML IJ SOLN
INTRAMUSCULAR | Status: DC | PRN
  Administered 2021-05-24: 2 mg via INTRAVENOUS

## 2021-05-24 MED ORDER — KETAMINE HCL 50 MG/5ML IJ SOSY
PREFILLED_SYRINGE | INTRAMUSCULAR | Status: AC
  Filled 2021-05-24: qty 5

## 2021-05-24 MED ORDER — LIDOCAINE HCL (PF) 2 % IJ SOLN
INTRAMUSCULAR | Status: AC
  Filled 2021-05-24: qty 5

## 2021-05-24 MED ORDER — DIPHENHYDRAMINE HCL 50 MG/ML IJ SOLN
INTRAMUSCULAR | Status: DC | PRN
  Administered 2021-05-24: 12.5 mg via INTRAVENOUS

## 2021-05-24 MED ORDER — DIPHENHYDRAMINE HCL 50 MG/ML IJ SOLN
INTRAMUSCULAR | Status: AC
  Filled 2021-05-24: qty 1

## 2021-05-24 MED ORDER — PHENYLEPHRINE HCL (PRESSORS) 10 MG/ML IV SOLN
INTRAVENOUS | Status: DC | PRN
  Administered 2021-05-24: 160 ug via INTRAVENOUS
  Administered 2021-05-24: 80 ug via INTRAVENOUS
  Administered 2021-05-24: 160 ug via INTRAVENOUS

## 2021-05-24 MED ORDER — VANCOMYCIN HCL 1000 MG IV SOLR
INTRAVENOUS | Status: DC | PRN
  Administered 2021-05-24: 1000 mg

## 2021-05-24 MED ORDER — ONDANSETRON HCL 4 MG/2ML IJ SOLN: 4.0000 mg | Freq: Once | INTRAMUSCULAR | Status: DC | PRN

## 2021-05-24 MED ORDER — BUPIVACAINE-EPINEPHRINE (PF) 0.5% -1:200000 IJ SOLN
INTRAMUSCULAR | Status: AC
  Filled 2021-05-24: qty 30

## 2021-05-24 MED ORDER — CEFAZOLIN SODIUM-DEXTROSE 2-4 GM/100ML-% IV SOLN
2.0000 g | INTRAVENOUS | Status: AC
  Administered 2021-05-24: 10:00:00 2 g via INTRAVENOUS

## 2021-05-24 MED ORDER — CEFAZOLIN SODIUM-DEXTROSE 2-4 GM/100ML-% IV SOLN
INTRAVENOUS | Status: AC
  Filled 2021-05-24: qty 100

## 2021-05-24 MED ORDER — ORAL CARE MOUTH RINSE: 15.0000 mL | Freq: Once | OROMUCOSAL | Status: AC

## 2021-05-24 MED ORDER — FENTANYL CITRATE (PF) 100 MCG/2ML IJ SOLN
INTRAMUSCULAR | Status: AC
  Filled 2021-05-24: qty 2

## 2021-05-24 MED ORDER — SUCCINYLCHOLINE CHLORIDE 200 MG/10ML IV SOSY
PREFILLED_SYRINGE | INTRAVENOUS | Status: AC
  Filled 2021-05-24: qty 10

## 2021-05-24 MED ORDER — HYDROCODONE-ACETAMINOPHEN 5-325 MG PO TABS: 1.0000 | ORAL_TABLET | Freq: Four times a day (QID) | ORAL | 0 refills | Status: AC | PRN

## 2021-05-24 MED ORDER — PROPOFOL 10 MG/ML IV BOLUS
INTRAVENOUS | Status: DC | PRN
  Administered 2021-05-24: 40 mg via INTRAVENOUS
  Administered 2021-05-24: 120 mg via INTRAVENOUS

## 2021-05-24 MED ORDER — DEXAMETHASONE SODIUM PHOSPHATE 10 MG/ML IJ SOLN
INTRAMUSCULAR | Status: AC
  Filled 2021-05-24: qty 1

## 2021-05-24 MED ORDER — FENTANYL CITRATE (PF) 100 MCG/2ML IJ SOLN
INTRAMUSCULAR | Status: AC
  Administered 2021-05-24: 11:00:00 25 ug via INTRAVENOUS
  Filled 2021-05-24: qty 2

## 2021-05-24 MED ORDER — OMEPRAZOLE 20 MG PO CPDR: DELAYED_RELEASE_CAPSULE | ORAL | 3 refills | Status: DC

## 2021-05-24 MED ORDER — KETAMINE HCL 10 MG/ML IJ SOLN
INTRAMUSCULAR | Status: DC | PRN
  Administered 2021-05-24: 10 mg via INTRAVENOUS
  Administered 2021-05-24: 20 mg via INTRAVENOUS

## 2021-05-24 MED ORDER — VANCOMYCIN HCL 1000 MG IV SOLR
INTRAVENOUS | Status: AC
  Filled 2021-05-24: qty 20

## 2021-05-24 MED ORDER — ONDANSETRON HCL 4 MG/2ML IJ SOLN
INTRAMUSCULAR | Status: AC
  Filled 2021-05-24: qty 2

## 2021-05-24 MED ORDER — BUPIVACAINE-EPINEPHRINE (PF) 0.5% -1:200000 IJ SOLN
INTRAMUSCULAR | Status: DC | PRN
  Administered 2021-05-24: 15 mL

## 2021-05-24 MED ORDER — ONDANSETRON HCL 4 MG/2ML IJ SOLN
INTRAMUSCULAR | Status: DC | PRN
  Administered 2021-05-24: 4 mg via INTRAVENOUS

## 2021-05-24 MED ORDER — PHENYLEPHRINE HCL-NACL 20-0.9 MG/250ML-% IV SOLN
INTRAVENOUS | Status: DC | PRN
  Administered 2021-05-24: 25 ug/min via INTRAVENOUS

## 2021-05-24 MED ORDER — 0.9 % SODIUM CHLORIDE (POUR BTL) OPTIME
TOPICAL | Status: DC | PRN
  Administered 2021-05-24: 10:00:00 1000 mL

## 2021-05-24 MED ORDER — PROPOFOL 10 MG/ML IV BOLUS
INTRAVENOUS | Status: AC
  Filled 2021-05-24: qty 20

## 2021-05-24 MED ORDER — ACETAMINOPHEN 10 MG/ML IV SOLN
INTRAVENOUS | Status: AC
  Filled 2021-05-24: qty 100

## 2021-05-24 MED ORDER — CHLORHEXIDINE GLUCONATE 0.12 % MT SOLN
OROMUCOSAL | Status: AC
  Administered 2021-05-24: 09:00:00 15 mL via OROMUCOSAL
  Filled 2021-05-24: qty 15

## 2021-05-24 MED ORDER — ACETAMINOPHEN 10 MG/ML IV SOLN
INTRAVENOUS | Status: DC | PRN
  Administered 2021-05-24: 1000 mg via INTRAVENOUS

## 2021-05-24 MED ORDER — ROCURONIUM BROMIDE 100 MG/10ML IV SOLN
INTRAVENOUS | Status: DC | PRN
  Administered 2021-05-24: 5 mg via INTRAVENOUS

## 2021-05-24 MED ORDER — EPHEDRINE SULFATE 50 MG/ML IJ SOLN
INTRAMUSCULAR | Status: DC | PRN
  Administered 2021-05-24: 5 mg via INTRAVENOUS
  Administered 2021-05-24: 10 mg via INTRAVENOUS
  Administered 2021-05-24 (×2): 5 mg via INTRAVENOUS

## 2021-05-24 MED ORDER — METHOCARBAMOL 500 MG PO TABS: 500.0000 mg | ORAL_TABLET | Freq: Three times a day (TID) | ORAL | 0 refills | Status: DC | PRN

## 2021-05-24 MED ORDER — CHLORHEXIDINE GLUCONATE 0.12 % MT SOLN: 15.0000 mL | Freq: Once | OROMUCOSAL | Status: AC

## 2021-05-24 MED ORDER — MIDAZOLAM HCL 2 MG/2ML IJ SOLN
INTRAMUSCULAR | Status: AC
  Filled 2021-05-24: qty 2

## 2021-05-24 SURGICAL SUPPLY — 87 items
ADH SKN CLS APL DERMABOND .7 (GAUZE/BANDAGES/DRESSINGS) ×1
AGENT HMST KT MTR STRL THRMB (HEMOSTASIS)
APL PRP STRL LF DISP 70% ISPRP (MISCELLANEOUS) ×2
APL SRG 60D 8 XTD TIP BNDBL (TIP)
BLADE BOVIE TIP EXT 4 (BLADE) ×3 IMPLANT
BUR NEURO DRILL SOFT 3.0X3.8M (BURR) IMPLANT
CHLORAPREP W/TINT 26 (MISCELLANEOUS) ×5 IMPLANT
CNTNR SPEC 2.5X3XGRAD LEK (MISCELLANEOUS) ×1
CONT SPEC 4OZ STER OR WHT (MISCELLANEOUS) ×2
CONT SPEC 4OZ STRL OR WHT (MISCELLANEOUS) ×1
CONTAINER SPEC 2.5X3XGRAD LEK (MISCELLANEOUS) ×1 IMPLANT
COUNTER NEEDLE 20/40 LG (NEEDLE) ×3 IMPLANT
COVER LIGHT HANDLE STERIS (MISCELLANEOUS) ×6 IMPLANT
CUP MEDICINE 2OZ PLAST GRAD ST (MISCELLANEOUS) ×3 IMPLANT
DERMABOND ADVANCED (GAUZE/BANDAGES/DRESSINGS) ×2
DERMABOND ADVANCED .7 DNX12 (GAUZE/BANDAGES/DRESSINGS) ×1 IMPLANT
DEVICE IMPLANT NEUROSTIMINATOR (Neuro Prosthesis/Implant) ×1 IMPLANT
DEVICE IMPLANT NEUROSTIMULATOR (Neuro Prosthesis/Implant) ×1 IMPLANT
DRAPE C-ARM 42X72 X-RAY (DRAPES) ×4 IMPLANT
DRAPE C-ARMOR (DRAPES) ×2 IMPLANT
DRAPE LAPAROTOMY 100X77 ABD (DRAPES) ×3 IMPLANT
DRAPE SURG 17X11 SM STRL (DRAPES) ×1 IMPLANT
DRSG OPSITE POSTOP 4X6 (GAUZE/BANDAGES/DRESSINGS) ×4 IMPLANT
DURASEAL APPLICATOR TIP (TIP) IMPLANT
DURASEAL SPINE SEALANT 3ML (MISCELLANEOUS) IMPLANT
ELECT CAUTERY BLADE TIP 2.5 (TIP) ×3
ELECT EZSTD 165MM 6.5IN (MISCELLANEOUS) ×3
ELECT REM PT RETURN 9FT ADLT (ELECTROSURGICAL) ×3
ELECTRODE CAUTERY BLDE TIP 2.5 (TIP) ×1 IMPLANT
ELECTRODE EZSTD 165MM 6.5IN (MISCELLANEOUS) ×1 IMPLANT
ELECTRODE REM PT RTRN 9FT ADLT (ELECTROSURGICAL) ×1 IMPLANT
ENVELOPE ABSORB ANTIBACTERIAL (Mesh General) ×3 IMPLANT
FEE INTRAOP CADWELL SUPPLY NCS (MISCELLANEOUS) ×1 IMPLANT
FEE INTRAOP MONITOR IMPULS NCS (MISCELLANEOUS) IMPLANT
GAUZE 4X4 16PLY ~~LOC~~+RFID DBL (SPONGE) ×3 IMPLANT
GAUZE SPONGE 4X4 12PLY STRL (GAUZE/BANDAGES/DRESSINGS) ×2 IMPLANT
GLOVE SRG 8 PF TXTR STRL LF DI (GLOVE) ×1 IMPLANT
GLOVE SURG SYN 6.5 ES PF (GLOVE) ×6 IMPLANT
GLOVE SURG SYN 6.5 PF PI (GLOVE) ×2 IMPLANT
GLOVE SURG SYN 7.0 (GLOVE) ×6 IMPLANT
GLOVE SURG SYN 7.0 PF PI (GLOVE) ×2 IMPLANT
GLOVE SURG SYN 8.0 (GLOVE) ×3 IMPLANT
GLOVE SURG SYN 8.0 PF PI (GLOVE) ×1 IMPLANT
GLOVE SURG UNDER POLY LF SZ6.5 (GLOVE) ×6 IMPLANT
GLOVE SURG UNDER POLY LF SZ7 (GLOVE) ×3 IMPLANT
GLOVE SURG UNDER POLY LF SZ8 (GLOVE) ×3
GOWN SRG LRG LVL 4 IMPRV REINF (GOWNS) ×2 IMPLANT
GOWN STRL REIN LRG LVL4 (GOWNS) ×6
GOWN STRL REUS W/ TWL LRG LVL3 (GOWN DISPOSABLE) ×1 IMPLANT
GOWN STRL REUS W/ TWL XL LVL3 (GOWN DISPOSABLE) ×2 IMPLANT
GOWN STRL REUS W/TWL LRG LVL3 (GOWN DISPOSABLE) ×3
GOWN STRL REUS W/TWL XL LVL3 (GOWN DISPOSABLE) ×6
GRADUATE 1200CC STRL 31836 (MISCELLANEOUS) ×3 IMPLANT
INTRAOP CADWELL SUPPLY FEE NCS (MISCELLANEOUS) ×1
INTRAOP DISP SUPPLY FEE NCS (MISCELLANEOUS) ×3
INTRAOP MONITOR FEE IMPULS NCS (MISCELLANEOUS)
INTRAOP MONITOR FEE IMPULSE (MISCELLANEOUS)
KIT LEAD (Lead) ×4 IMPLANT
KIT TURNOVER KIT A (KITS) ×3 IMPLANT
MANIFOLD NEPTUNE II (INSTRUMENTS) ×3 IMPLANT
MARKER SKIN DUAL TIP RULER LAB (MISCELLANEOUS) ×8 IMPLANT
NDL SAFETY ECLIPSE 18X1.5 (NEEDLE) ×1 IMPLANT
NEEDLE HYPO 18GX1.5 SHARP (NEEDLE) ×3
NEEDLE HYPO 22GX1.5 SAFETY (NEEDLE) ×3 IMPLANT
NS IRRIG 1000ML POUR BTL (IV SOLUTION) ×3 IMPLANT
PACK LAMINECTOMY NEURO (CUSTOM PROCEDURE TRAY) ×3 IMPLANT
PAD ARMBOARD 7.5X6 YLW CONV (MISCELLANEOUS) ×8 IMPLANT
POSITIONER HEAD PRONE TRACH (MISCELLANEOUS) ×2 IMPLANT
POUCH TYRX ANTIBAC NEURO MED (Mesh General) IMPLANT
RECHARGER INTELLIS (NEUROSURGERY SUPPLIES) ×2 IMPLANT
REPROGRAMMER INTELLIS (NEUROSURGERY SUPPLIES) ×2 IMPLANT
STAPLER SKIN PROX 35W (STAPLE) IMPLANT
SURGIFLO W/THROMBIN 8M KIT (HEMOSTASIS) IMPLANT
SUT ETHILON 3-0 FS-10 30 BLK (SUTURE)
SUT POLYSORB 2-0 5X18 GS-10 (SUTURE) ×12 IMPLANT
SUT SILK 2 0 SH (SUTURE) ×10 IMPLANT
SUT VIC AB 0 CT1 18XCR BRD 8 (SUTURE) ×2 IMPLANT
SUT VIC AB 0 CT1 8-18 (SUTURE) ×6
SUTURE EHLN 3-0 FS-10 30 BLK (SUTURE) ×2 IMPLANT
SYR 10ML LL (SYRINGE) ×6 IMPLANT
SYR 20ML LL LF (SYRINGE) ×3 IMPLANT
SYR 30ML LL (SYRINGE) ×6 IMPLANT
SYR 3ML LL SCALE MARK (SYRINGE) ×3 IMPLANT
TOWEL OR 17X26 4PK STRL BLUE (TOWEL DISPOSABLE) ×6 IMPLANT
TUBING CONNECTING 10 (TUBING) ×2 IMPLANT
TUBING CONNECTING 10' (TUBING) ×1
WATER STERILE IRR 500ML POUR (IV SOLUTION) ×1 IMPLANT

## 2021-05-24 NOTE — H&P (Signed)
Elizabeth Long is an 60 y.o. female.   Chief Complaint: Chronic pain HPI: Elizabeth Long returns today after recently undergoing a spinal cord stimulator trial. She was seen previously with pain in her back and legs not responding to medical management. She states that with the spinal cord stimulator trial, she got 80% relief from her symptoms. She had no complications with the trial. Given this, she is here for a permanent implant.  Past Medical History:  Diagnosis Date   Allergic rhinitis    Allergy    Anemia    as a teenager   Anxiety    history of panic attack   Anxiety associated with depression    Chronic headaches    migraines- not any longer   Depression    Essential hypertension 10/24/2015   Family history of adverse reaction to anesthesia    mother - N/V   GERD (gastroesophageal reflux disease)    History of alcoholism ([place])    Sober since 1994 Strong fmhx alcoholism   History of pneumonia    x2   Hyperlipidemia    IBS (irritable bowel syndrome)    Irritable bowel syndrome (IBS)    constipation   Microscopic colitis    Osteoarthritis 2000s   chronic back and shoulder pain s/p surgery   Pneumonia    had x 2 last time age 89   Post menopausal syndrome 10/24/2015   Substance abuse ([place])    alcohol abuse   Tubular adenoma of colon     Past Surgical History:  Procedure Laterality Date   ABDOMINAL HYSTERECTOMY  1990   endometriosis   ANTERIOR CERVICAL DECOMP/DISCECTOMY FUSION  2006   C5/6 HNP ([facility])   APPENDECTOMY     CHOLECYSTECTOMY N/A 10/19/2016   Procedure: LAPAROSCOPIC CHOLECYSTECTOMY;  Surgeon: Stark Klein, MD;  Location: [facility];  Service: General;  Laterality: N/A;   COLONOSCOPY  06/2011   TA, microscopic colitis, rpt 5 yrs ([facility])   COLONOSCOPY  09/2016   60mm polyp - tubular adenoma rpt 3 yrs (Nandigam)   COLONOSCOPY  10/2019   41mm HP, int hem, rpt 5 yrs (Nandigam)   cyst on ovary     EYE SURGERY Bilateral    Lasik   LUMBAR [place] SURGERY  2002    [facility]   lumbar esi  2010   NOSE SURGERY     POLYPECTOMY     SHOULDER SURGERY Right    Rotatar Cuff   TONSILLECTOMY      Family History  Problem Relation Age of Onset   Arthritis Mother    CAD Mother 73       4v bypass   Arthritis Father    Hypertension Father    Alcohol abuse Brother    Colon polyps Brother    Heart disease Maternal Grandmother        CHF   Lung cancer Maternal Grandfather        (smoker)   Alcohol abuse Paternal Grandmother    Hyperlipidemia Paternal Grandmother    CAD Paternal Grandmother        MI x3   Stroke Paternal Grandmother    Hypertension Paternal Grandmother    Alcohol abuse Paternal Grandfather    Arthritis Paternal Grandfather    Heart disease Paternal Grandfather    Stroke Paternal Grandfather    Diabetes Paternal Grandfather    Throat cancer Maternal Aunt    CAD Paternal Uncle        MI   Stroke Paternal Uncle  Throat cancer Maternal Uncle    Colon cancer Neg Hx    Esophageal cancer Neg Hx    Stomach cancer Neg Hx    Rectal cancer Neg Hx    Social History:  reports that she quit smoking about 21 years ago. Her smoking use included cigarettes. She has never used smokeless tobacco. She reports that she does not drink alcohol and does not use drugs.  Allergies:  Allergies  Allergen Reactions   Cefotan [Cefotetan Disodium] Swelling   Elemental Sulfur Rash    Rash all over    Facility-Administered Medications Prior to Admission  Medication Dose Route Frequency Provider Last Rate Last Admin   0.9 %  sodium chloride infusion  500 mL Intravenous Once Nandigam, Venia Minks, MD       Medications Prior to Admission  Medication Sig Dispense Refill   cetirizine (ZYRTEC) 10 MG tablet Take 10 mg by mouth daily as needed for allergies.     Cholecalciferol (VITAMIN D3) 250 MCG (10000 UT) capsule Take 10,000 Units by mouth daily.     COLLAGEN PO Take 1 Scoop by mouth daily.     diclofenac Sodium (VOLTAREN) 1 % GEL Apply 2 g topically daily  as needed (pain).     escitalopram (LEXAPRO) 5 MG tablet TAKE 1 TABLET BY MOUTH EVERY DAY 90 tablet 1   ferrous sulfate 324 (65 Fe) MG TBEC Take 1 tablet (325 mg total) by mouth daily.     fluticasone (FLONASE) 50 MCG/ACT nasal spray Place 2 sprays into both nostrils daily. (Patient taking differently: Place 2 sprays into both nostrils daily as needed for allergies.) 16 g 1   gabapentin (NEURONTIN) 300 MG capsule Take 1 capsule (300 mg total) by mouth 2 (two) times daily. 180 capsule 1   hydrochlorothiazide (MICROZIDE) 12.5 MG capsule TAKE 1 CAPSULE BY MOUTH EVERY DAY 90 capsule 1   hydrOXYzine (ATARAX/VISTARIL) 25 MG tablet TAKE 1 TABLET BY MOUTH EVERYDAY AT BEDTIME 90 tablet 1   imipramine (TOFRANIL) 25 MG tablet TAKE 1 TABLET BY MOUTH EVERYDAY AT BEDTIME 90 tablet 3   LORazepam (ATIVAN) 1 MG tablet Take 1 tablet (1 mg total) by mouth at bedtime.     Multiple Vitamins-Minerals (CENTRUM SILVER 50+WOMEN) TABS Take 1 tablet by mouth daily.     omeprazole (PRILOSEC) 20 MG capsule TAKE 1 CAPSULE BY MOUTH EVERY DAY 90 capsule 1   polyvinyl alcohol (LIQUIFILM TEARS) 1.4 % ophthalmic solution Place 1 drop into both eyes as needed for dry eyes.     vitamin C (ASCORBIC ACID) 500 MG tablet Take 500 mg by mouth daily.     Zinc Sulfate (ZINC 15 PO) Take 1 tablet by mouth daily.      No results found for this or any previous visit (from the past 48 hour(s)). No results found.  Review of Systems General ROS: Negative Respiratory ROS: Negative Cardiovascular ROS: Negative Gastrointestinal ROS: Negative Genito-Urinary ROS: Negative Musculoskeletal ROS: Positive for back pain Neurological ROS: Positive for leg pain Dermatological ROS: Negative  Blood pressure 136/77, pulse 74, temperature 97.7 F (36.5 C), temperature source Temporal, resp. rate 16, height 5\' 4"  (1.626 m), weight 62.4 kg, SpO2 99 %. Physical Exam  General appearance: Alert, cooperative, in no acute distress  Neurologic exam:   Mental status: alertness: alert, affect: normal Speech: fluent and clear Motor:strength symmetric 5/5 in bilateral lower extremities Sensory: intact to light touch in bilateral lower extremities Gait: normal  Imaging: MRI thoracic spine: There is a kyphotic curvature.  There is some mild disc bulging at multiple levels without any significant central stenosis.   Assessment/Plan Proceed with thoracic SCS  Elizabeth Perla, MD 05/24/2021, 8:56 AM

## 2021-05-24 NOTE — Anesthesia Procedure Notes (Signed)
Procedure Name: Intubation Date/Time: 05/24/2021 9:40 AM Performed by: Lia Foyer, CRNA Pre-anesthesia Checklist: Patient identified, Emergency Drugs available, Suction available and Patient being monitored Patient Re-evaluated:Patient Re-evaluated prior to induction Oxygen Delivery Method: Circle system utilized Preoxygenation: Pre-oxygenation with 100% oxygen Induction Type: IV induction Ventilation: Mask ventilation without difficulty Laryngoscope Size: McGraph and 3 Grade View: Grade I Tube type: Oral Tube size: 7.0 mm Number of attempts: 1 Airway Equipment and Method: Stylet and Video-laryngoscopy Placement Confirmation: ETT inserted through vocal cords under direct vision, positive ETCO2 and breath sounds checked- equal and bilateral Secured at: 21 cm Tube secured with: Tape Dental Injury: Teeth and Oropharynx as per pre-operative assessment

## 2021-05-24 NOTE — Telephone Encounter (Signed)
E-scribed refills.  

## 2021-05-24 NOTE — Discharge Summary (Signed)
cooPhysician Discharge Summary  Patient ID: Elizabeth Long MRN: [id number] DOB/AGE: 09-27-1960 60 y.o.  Admit date: 05/24/2021 Discharge date: 05/24/2021  Admission Diagnoses: Chronic pain   Discharge Diagnoses:  Active Problems:   * No active hospital problems. *   Discharged Condition: good  Hospital Course:  Elizabeth Long is a 60 y.o with a history of chronic pain presenting for placement of SCS. Her interoperative course was uncomplicated. She was monitored in PACU post-op and discharged home after ambulating and tolerating PO intake. She was given prescriptions for Norco and Robaxin as needed for pain.   Consults: None  Significant Diagnostic Studies: none  Treatments: surgery: as above. Please see separately dictated operative report for further details.   Discharge Exam: Blood pressure 136/77, pulse 74, temperature 97.7 F (36.5 C), temperature source Temporal, resp. rate 16, height 5\' 4"  (1.626 m), weight 62.4 kg, SpO2 99 %. CN II-XII grossly intact Good and equal strength in BLE Incisions c/d/I   Disposition: Discharge disposition: 01-Home or Self Care        Allergies as of 05/24/2021       Reactions   Cefotan [cefotetan Disodium] Swelling   Elemental Sulfur Rash   Rash all over        Medication List     TAKE these medications    Centrum Silver 50+Women Tabs Take 1 tablet by mouth daily.   cetirizine 10 MG tablet Commonly known as: ZYRTEC Take 10 mg by mouth daily as needed for allergies.   COLLAGEN PO Take 1 Scoop by mouth daily.   diclofenac Sodium 1 % Gel Commonly known as: VOLTAREN Apply 2 g topically daily as needed (pain).   escitalopram 5 MG tablet Commonly known as: LEXAPRO TAKE 1 TABLET BY MOUTH EVERY DAY   ferrous sulfate 324 (65 Fe) MG Tbec Take 1 tablet (325 mg total) by mouth daily.   fluticasone 50 MCG/ACT nasal spray Commonly known as: FLONASE Place 2 sprays into both nostrils daily. What changed:  when to take  this reasons to take this   gabapentin 300 MG capsule Commonly known as: NEURONTIN Take 1 capsule (300 mg total) by mouth 2 (two) times daily.   hydrochlorothiazide 12.5 MG capsule Commonly known as: MICROZIDE TAKE 1 CAPSULE BY MOUTH EVERY DAY   HYDROcodone-acetaminophen 5-325 MG tablet Commonly known as: Norco Take 1 tablet by mouth every 6 (six) hours as needed for up to 5 days for moderate pain.   hydrOXYzine 25 MG tablet Commonly known as: ATARAX TAKE 1 TABLET BY MOUTH EVERYDAY AT BEDTIME   imipramine 25 MG tablet Commonly known as: TOFRANIL TAKE 1 TABLET BY MOUTH EVERYDAY AT BEDTIME   LORazepam 1 MG tablet Commonly known as: ATIVAN Take 1 tablet (1 mg total) by mouth at bedtime.   methocarbamol 500 MG tablet Commonly known as: Robaxin Take 1 tablet (500 mg total) by mouth every 8 (eight) hours as needed for muscle spasms.   omeprazole 20 MG capsule Commonly known as: PRILOSEC TAKE 1 CAPSULE BY MOUTH EVERY DAY   polyvinyl alcohol 1.4 % ophthalmic solution Commonly known as: LIQUIFILM TEARS Place 1 drop into both eyes as needed for dry eyes.   vitamin C 500 MG tablet Commonly known as: ASCORBIC ACID Take 500 mg by mouth daily.   Vitamin D3 250 MCG (10000 UT) capsule Take 10,000 Units by mouth daily.   ZINC 15 PO Take 1 tablet by mouth daily.        Follow-up Information  Loleta Dicker, PA Follow up in 2 week(s).   Why: For incision check. This should already be scheduled with the [facility]. Please call the office with any questions or concerns regarding appointment date or time. Please bring stimulator accessories as they rep should be there to make adjustments if needed. Contact information: [place] [address] [phone]                 Signed: Loleta Dicker 05/24/2021, 11:01 AM

## 2021-05-24 NOTE — Discharge Instructions (Addendum)
NEUROSURGERY DISCHARGE INSTRUCTIONS  Admission diagnosis: chronic pain syndrome g894  Operative procedure: Placement of thoracic SCS and pulse generator   What to do after you leave the hospital:  Recommended diet: regular diet. Increase protein intake to promote wound healing.  Recommended activity: no lifting, driving, or strenuous exercise for 6 weeks . You should walk multiple times per day  Special Instructions  No straining, no heavy lifting > 10lbs x 4 weeks.  Keep incision area clean and dry. May shower in 2 days. No baths or pools for 6 weeks.  Please remove dressing tomorrow, no need to apply a bandage afterwards  You have no sutures to remove, the skin is closed with adhesive  Please take pain medications as directed. Take a stool softener if on pain medications   Please Report any of the following: Nausea or Vomiting, Temperature is greater than 101.57F (38.1C) degrees, Dizziness, Abdominal Pain, Difficulty Breathing or Shortness of Breath, Inability to Eat, drink Fluids, or Take medications, Bleeding, swelling, or drainage from surgical incision sites, New numbness or weakness, and Bowel or bladder dysfunction to the neurosurgeon on call at [phone]  Additional Follow up appointments Please follow up with Cooper Render PA-C in [facility] as scheduled in 2-3 weeks   Please see below for scheduled appointments:  Future Appointments  Date Time Provider [place]  10/25/2021  3:30 PM Ria Bush, MD [facility] [place]   The drugs that you were given will stay in your system until tomorrow so for the next 24 hours you should not:  Drive an automobile Make any legal decisions Drink any alcoholic beverage   You may resume regular meals tomorrow.  Today it is better to start with liquids and gradually work up to solid foods.  You may eat anything you prefer, but it is better to start with liquids, then  soup and crackers, and gradually work up to solid foods.   Please notify your doctor immediately if you have any unusual bleeding, trouble breathing, redness and pain at the surgery site, drainage, fever, or pain not relieved by medication.    Additional Instructions:        Please contact your physician with any problems or Same Day Surgery at [phone], Monday through Friday 6 am to 4 pm, or [place] at [facility] number at [phone].

## 2021-05-24 NOTE — Anesthesia Preprocedure Evaluation (Signed)
Anesthesia Evaluation  Patient identified by MRN, date of birth, ID band Patient awake    Reviewed: Allergy & Precautions, NPO status , Patient's Chart, lab work & pertinent test results  Airway Mallampati: II  TM Distance: >3 FB Neck ROM: full    Dental  (+) Teeth Intact   Pulmonary neg pulmonary ROS, former smoker,    Pulmonary exam normal breath sounds clear to auscultation       Cardiovascular Exercise Tolerance: Good hypertension, Pt. on medications negative cardio ROS Normal cardiovascular exam Rhythm:Regular Rate:Normal     Neuro/Psych  Headaches, Anxiety Depression negative neurological ROS  negative psych ROS   GI/Hepatic negative GI ROS, Neg liver ROS, GERD  ,  Endo/Other  negative endocrine ROS  Renal/GU negative Renal ROS  negative genitourinary   Musculoskeletal   Abdominal Normal abdominal exam  (+)   Peds  Hematology negative hematology ROS (+) Blood dyscrasia, anemia ,   Anesthesia Other Findings Past Medical History: No date: Allergic rhinitis No date: Allergy No date: Anemia     Comment:  as a teenager No date: Anxiety     Comment:  history of panic attack No date: Anxiety associated with depression No date: Chronic headaches     Comment:  migraines- not any longer No date: Depression 10/24/2015: Essential hypertension No date: Family history of adverse reaction to anesthesia     Comment:  mother - N/V No date: GERD (gastroesophageal reflux disease) No date: History of alcoholism ([place])     Comment:  Sober since 1994 Strong fmhx alcoholism No date: History of pneumonia     Comment:  x2 No date: Hyperlipidemia No date: IBS (irritable bowel syndrome) No date: Irritable bowel syndrome (IBS)     Comment:  constipation No date: Microscopic colitis 2000s: Osteoarthritis     Comment:  chronic back and shoulder pain s/p surgery No date: Pneumonia     Comment:  had x 2 last time age  35 10/24/2015: Post menopausal syndrome No date: Substance abuse ([place])     Comment:  alcohol abuse No date: Tubular adenoma of colon  Past Surgical History: 1990: ABDOMINAL HYSTERECTOMY     Comment:  endometriosis 2006: ANTERIOR CERVICAL DECOMP/DISCECTOMY FUSION     Comment:  C5/6 HNP ([facility]) No date: APPENDECTOMY 10/19/2016: CHOLECYSTECTOMY; N/A     Comment:  Procedure: LAPAROSCOPIC CHOLECYSTECTOMY;  Surgeon:               Stark Klein, MD;  Location: [facility];  Service: General;                Laterality: N/A; 06/2011: COLONOSCOPY     Comment:  TA, microscopic colitis, rpt 5 yrs Olevia Perches) 09/2016: COLONOSCOPY     Comment:  53mm polyp - tubular adenoma rpt 3 yrs (Nandigam) 10/2019: COLONOSCOPY     Comment:  62mm HP, int hem, rpt 5 yrs (Nandigam) No date: cyst on ovary No date: EYE SURGERY; Bilateral     Comment:  Lasik 2002: LUMBAR DISC SURGERY     Comment:  [facility] 2010: lumbar esi No date: NOSE SURGERY No date: POLYPECTOMY No date: SHOULDER SURGERY; Right     Comment:  Rotatar Cuff No date: TONSILLECTOMY  BMI    Body Mass Index: 23.62 kg/m      Reproductive/Obstetrics negative OB ROS                             Anesthesia Physical Anesthesia Plan  ASA: 2  Anesthesia Plan: General   Post-op Pain Management:    Induction:   PONV Risk Score and Plan: 1 and Ondansetron  Airway Management Planned: Oral ETT  Additional Equipment:   Intra-op Plan:   Post-operative Plan: Extubation in OR  Informed Consent: I have reviewed the patients History and Physical, chart, labs and discussed the procedure including the risks, benefits and alternatives for the proposed anesthesia with the patient or authorized representative who has indicated his/her understanding and acceptance.     Dental Advisory Given  Plan Discussed with: CRNA and Surgeon  Anesthesia Plan Comments:         Anesthesia Quick Evaluation

## 2021-05-24 NOTE — Anesthesia Postprocedure Evaluation (Signed)
Anesthesia Post Note  Patient: Elizabeth Long  Procedure(s) Performed: THORACIC SPINAL CORD STIMULATOR AND PULSE GENERATOR PLACEMENT ([employer]) (Thoracic)  Patient location during evaluation: PACU Anesthesia Type: General Level of consciousness: awake and awake and alert Pain management: satisfactory to patient Vital Signs Assessment: post-procedure vital signs reviewed and stable Respiratory status: spontaneous breathing and respiratory function stable Cardiovascular status: stable Anesthetic complications: no   No notable events documented.   Last Vitals:  Vitals:   05/24/21 1135 05/24/21 1145  BP:  128/72  Pulse: 72 82  Resp: 14 12  Temp:  (!) 36.1 C  SpO2: 99% 100%    Last Pain:  Vitals:   05/24/21 1145  TempSrc:   PainSc: 3                  VAN STAVEREN,Hedda Crumbley

## 2021-05-24 NOTE — Transfer of Care (Signed)
Immediate Anesthesia Transfer of Care Note  Patient: Elizabeth Long  Procedure(s) Performed: THORACIC SPINAL CORD STIMULATOR AND PULSE GENERATOR PLACEMENT (MEDTRONIC) (Thoracic)  Patient Location: PACU  Anesthesia Type:General  Level of Consciousness: drowsy and patient cooperative  Airway & Oxygen Therapy: Patient Spontanous Breathing and Patient connected to face mask oxygen  Post-op Assessment: Report given to RN and Post -op Vital signs reviewed and stable  Post vital signs: Reviewed and stable  Last Vitals:  Vitals Value Taken Time  BP 128/65 05/24/21 1101  Temp    Pulse 80 05/24/21 1103  Resp 18 05/24/21 1103  SpO2 100 % 05/24/21 1103  Vitals shown include unvalidated device data.  Last Pain:  Vitals:   05/24/21 0818  TempSrc: Temporal  PainSc: 7          Complications: No notable events documented.

## 2021-05-24 NOTE — Interval H&P Note (Signed)
History and Physical Interval Note:  05/24/2021 8:57 AM  Elizabeth Long  has presented today for surgery, with the diagnosis of chronic pain syndrome g89.4.  The various methods of treatment have been discussed with the patient and family. After consideration of risks, benefits and other options for treatment, the patient has consented to  Procedure(s): [place] ([place]) (N/A) as a surgical intervention.  The patient's history has been reviewed, patient examined, no change in status, stable for surgery.  I have reviewed the patient's chart and labs.  Questions were answered to the patient's satisfaction.     Deetta Perla

## 2021-05-24 NOTE — Op Note (Signed)
Operative Note °  °SURGERY DATE: 05/24/2021 °  °PRE-OP DIAGNOSIS:  Chronic pain syndrome °  °POST-OP DIAGNOSIS: Post-Op Diagnosis Codes: °Chronic pain syndrome °  °Procedure(s) with comments: °Percutaneous thoracic spinal cord stimulator lead placement °Left flank pulse generator placement °  °SURGEON:  °   * Steven Henry Cook, MD  °    Danielle Koch, PA °  °  °ANESTHESIA: General  °  °OPERATIVE FINDINGS: Successful placement of thoracic spinal cord stimulator and pulse generator °  °Indication °Ms Elizabeth Long was seen in clinic on 11/15 after having undergone a previous spinal cord stimulator trial and did well with greater than 80% relief of pain in the legs and back. The patient wished to proceed to permanent implant to decrease medication use and achieve better pain control. Risks including damage to spinal cord, weakness, hematoma, infection, failure of pain relief, post-operative pain, need for revision, stroke, heart attack, pneumonia, and spinal cord injury were discussed.  °  °  °Procedure °The patient was brought to the operating room where vascular access was obtained and she was intubated by the anesthesia service.   She was placed prone on gel rolls. Antibiotics were given. Fluoroscopy was used to confirm planned incision in lumbar area at the level of L2 in the midline.  An additional incision was planned in the right flank for the pulse generator placement.  The patient was prepped and draped in a sterile fashion. A hard time out was performed. Local anesthetic was instilled into planned incision sites.  °  °The midline lumbar incision was opened and taken to the fascia using cautery.Next, a Touhy needle was used to insert in a paramedian approach to enter the interlaminar space between L2 ad L1.  Once loss of resistance was identified, this was confirmed with a metal stylette.  Next, the percutaneous lead was passed in the rostral direction using fluoroscopy as guidance to keep in the midline.  This was  passed without resistance to the level of the T8 vertebral body on the right.  Lateral views were obtained to confirm we were in the dorsal epidural space.  Next, the same procedure was performed contralaterally. We placed an additional lead on the left at T8, slightly offset to the previous lead to allow for better coverage. The leads were secured with anchors in the fascia.  The incision was irrigated and hemostasis obtained.  °  °Next, the left flank incision was opened and taken to the fascia and inferior dissection to allow for a large enough pocket for the placement of the battery.  The incision was irrigated and hemostasis obtained.  Next, a tunneler was used to pass the electrode leads from the lumbar incision to the right flank.  There, the electrodes were attached to the pulse generator and impedances were found to be normal.  The pulse generator was placed into the incision taking care to place the wires beneath the pulse generator.  °  °A final fluoroscopic image was taken show good placement of percutaneous leads. The battery and wires were secured to fascia with suture with the battery placed in an antibiotic pouch. Vancomycin powder was placed into the incisions.  Next, a combination of 0 and 2-0  Vicryls were used to close the incisions with Dermabond on the skin in flank and lumbar midline. °  °The patient was returned to supine position and the patient was seen to be moving all extremities symmetrically and was taken to PACU for recovery. The family was updated and   all questions answered.  °  °  °ESTIMATED BLOOD LOSS:  ° 20 cc °  °  °  °IMPLANT °KIT LEAD - [id number] ° °Inventory Item: KIT LEAD Serial no.:  Model/Cat no.: [id number]  °Implant name: KIT LEAD - [id number] Laterality: N/A Area: Thoracic  °Manufacturer: [employer] Date of Manufacture:    °Action: Implanted Number Used: 1   °Device Identifier:  Device Identifier Type:    ° °KIT LEAD - [id number] ° °Inventory Item: KIT LEAD Serial no.:   Model/Cat no.: [id number]  °Implant name: KIT LEAD - [id number] Laterality: N/A Area: Thoracic  °Manufacturer: [employer] Date of Manufacture:    °Action: Implanted Number Used: 1   °Device Identifier:  Device Identifier Type:    ° °DEVICE IMPLANT NEUROSTIMINATOR - [id number] ° °Inventory Item: DEVICE IMPLANT NEUROSTIMINATOR Serial no.: [id number] Model/Cat no.: [id number]  °Implant name: DEVICE IMPLANT NEUROSTIMINATOR - [id number] Laterality: N/A Area: Thoracic  °Manufacturer: [employer] NEUROMOD PAIN MGMT Date of Manufacture:    °Action: Implanted Number Used: 1   °Device Identifier:  Device Identifier Type:    ° °ENVELOPE ABSORB ANTIBACTERIAL - [id number] ° °Inventory Item: ENVELOPE ABSORB ANTIBACTERIAL Serial no.:  Model/Cat no.: [id number]  °Implant name: ENVELOPE ABSORB ANTIBACTERIAL - [id number] Laterality: N/A Area: Thoracic  °Manufacturer: [employer] Date of Manufacture:    °Action: Implanted Number Used: 1   °Device Identifier:  Device Identifier Type:    ° °I performed the case in its entirety with the assistance of Danielle Koch, PA °  °Steven Cook, MD °[phone] ° °

## 2021-05-24 NOTE — Progress Notes (Signed)
Per Dr. Lacinda Axon, pt. does not need to void before d/c.

## 2021-07-01 DIAGNOSIS — Z6823 Body mass index (BMI) 23.0-23.9, adult: Secondary | ICD-10-CM | POA: Diagnosis not present

## 2021-07-01 DIAGNOSIS — Z1231 Encounter for screening mammogram for malignant neoplasm of breast: Secondary | ICD-10-CM | POA: Diagnosis not present

## 2021-07-01 DIAGNOSIS — Z01419 Encounter for gynecological examination (general) (routine) without abnormal findings: Secondary | ICD-10-CM | POA: Diagnosis not present

## 2021-07-07 ENCOUNTER — Other Ambulatory Visit: Payer: Self-pay | Admitting: Family Medicine

## 2021-07-14 NOTE — Telephone Encounter (Signed)
Gabapentin Last filled:  04/13/21, #180 Last OV:  04/26/21, CPE Next OV:  10/25/21, 6 mo f/u

## 2021-07-22 ENCOUNTER — Other Ambulatory Visit: Payer: Self-pay

## 2021-07-22 ENCOUNTER — Encounter: Payer: Self-pay | Admitting: Nurse Practitioner

## 2021-07-22 ENCOUNTER — Ambulatory Visit: Payer: BC Managed Care – PPO | Admitting: Nurse Practitioner

## 2021-07-22 VITALS — BP 120/88 | HR 65 | Temp 96.7°F | Resp 14 | Ht 64.0 in | Wt 141.2 lb

## 2021-07-22 DIAGNOSIS — J069 Acute upper respiratory infection, unspecified: Secondary | ICD-10-CM | POA: Diagnosis not present

## 2021-07-22 DIAGNOSIS — R051 Acute cough: Secondary | ICD-10-CM | POA: Diagnosis not present

## 2021-07-22 MED ORDER — BENZONATATE 100 MG PO CAPS: 100.0000 mg | ORAL_CAPSULE | Freq: Three times a day (TID) | ORAL | 0 refills | Status: AC | PRN

## 2021-07-22 MED ORDER — AZITHROMYCIN 250 MG PO TABS: ORAL_TABLET | ORAL | 0 refills | Status: AC

## 2021-07-22 NOTE — Patient Instructions (Signed)
Nice to see you today I sent medication over to your pharmacy If you do not start improving over the next 5 days let us know. Follow up if symptoms do not improve or worsen

## 2021-07-22 NOTE — Assessment & Plan Note (Signed)
Patient can use Tessalon Perles 100 mg 3 times daily as needed cough

## 2021-07-22 NOTE — Progress Notes (Signed)
Acute Office Visit  Subjective:    Patient ID: Elizabeth Long, female    DOB: 30-Aug-1960, 61 y.o.   MRN: [id number]  Chief Complaint  Patient presents with   Sore Throat    Sx started on 07/14/21-taste is off, no energy, gets short of breath walking in the house, sometimes with laying down her chest hurts (not like pressure but just pain), post nasal drip. No sinus pressure or pain. Patient has tried Claritin, OTC Zyrtec version. Has had these recurrent symptoms for at least 3 months. Covid test negative on 07/18/21     Patient is in today for   Symptoms started on 02/08/223 Covid test on Sunday that was negative  Pfizer vaccine x2 and 2 boosters Has been using claratin daily. She would also take mucinex daytime and nighttime, with releif at first, but then the amount of relief lessened  Has been taking dayquill and cough medications.  States she has had pna twice in the past. Past Medical History:  Diagnosis Date   Allergic rhinitis    Allergy    Anemia    as a teenager   Anxiety    history of panic attack   Anxiety associated with depression    Chronic headaches    migraines- not any longer   Depression    Essential hypertension 10/24/2015   Family history of adverse reaction to anesthesia    mother - N/V   GERD (gastroesophageal reflux disease)    History of alcoholism ([place])    Sober since 1994 Strong fmhx alcoholism   History of pneumonia    x2   Hyperlipidemia    IBS (irritable bowel syndrome)    Irritable bowel syndrome (IBS)    constipation   Microscopic colitis    Osteoarthritis 2000s   chronic back and shoulder pain s/p surgery   Pneumonia    had x 2 last time age 21   Post menopausal syndrome 10/24/2015   Substance abuse ([place])    alcohol abuse   Tubular adenoma of colon     Past Surgical History:  Procedure Laterality Date   ABDOMINAL HYSTERECTOMY  1990   endometriosis   ANTERIOR CERVICAL DECOMP/DISCECTOMY FUSION  2006   C5/6 HNP ([place])    APPENDECTOMY     CHOLECYSTECTOMY N/A 10/19/2016   Procedure: LAPAROSCOPIC CHOLECYSTECTOMY;  Surgeon: Stark Klein, MD;  Location: [facility];  Service: General;  Laterality: N/A;   COLONOSCOPY  06/2011   TA, microscopic colitis, rpt 5 yrs ([place])   COLONOSCOPY  09/2016   81mm polyp - tubular adenoma rpt 3 yrs ([place])   COLONOSCOPY  10/2019   21mm HP, int hem, rpt 5 yrs ([place])   cyst on ovary     EYE SURGERY Bilateral    Lasik   LUMBAR [place] SURGERY  2002   [place]   lumbar esi  2010   NOSE SURGERY     POLYPECTOMY     SHOULDER SURGERY Right    Rotatar Cuff   THORACIC LAMINECTOMY FOR SPINAL CORD STIMULATOR N/A 05/24/2021   Procedure: THORACIC SPINAL CORD STIMULATOR AND PULSE GENERATOR PLACEMENT ([employer]);  Surgeon: Deetta Perla, MD;  Location: [facility];  Service: Neurosurgery;  Laterality: N/A;   TONSILLECTOMY      Family History  Problem Relation Age of Onset   Arthritis Mother    CAD Mother 75       4v bypass   Arthritis Father    Hypertension Father    Alcohol abuse Brother  Colon polyps Brother    Heart disease Maternal Grandmother        CHF   Lung cancer Maternal Grandfather        (smoker)   Alcohol abuse Paternal Grandmother    Hyperlipidemia Paternal Grandmother    CAD Paternal Grandmother        MI x3   Stroke Paternal Grandmother    Hypertension Paternal Grandmother    Alcohol abuse Paternal Grandfather    Arthritis Paternal Grandfather    Heart disease Paternal Grandfather    Stroke Paternal Grandfather    Diabetes Paternal Grandfather    Throat cancer Maternal Aunt    CAD Paternal Uncle        MI   Stroke Paternal Uncle    Throat cancer Maternal Uncle    Colon cancer Neg Hx    Esophageal cancer Neg Hx    Stomach cancer Neg Hx    Rectal cancer Neg Hx     Social History   Socioeconomic History   Marital status: Single    Spouse name: Not on file   Number of children: 0   Years of education: Not on file   Highest education level: Not on  file  Occupational History   Occupation: CLERK CLASS 1    Employer: [employer]    Comment: full time  Tobacco Use   Smoking status: Former    Years: 30.00    Types: Cigarettes    Quit date: 04/11/2000    Years since quitting: 21.2   Smokeless tobacco: Never  Vaping Use   Vaping Use: Never used  Substance and Sexual Activity   Alcohol use: No    Alcohol/week: 0.0 standard drinks    Comment: hx   Drug use: No   Sexual activity: Yes  Other Topics Concern   Not on file  Social History Narrative   Daily caffeine- coffee (3-4 cups per day)   Right handed    Lives alone   Social Determinants of Health   Financial Resource Strain: Not on file  Food Insecurity: Not on file  Transportation Needs: Not on file  Physical Activity: Not on file  Stress: Not on file  Social Connections: Not on file  Intimate Partner Violence: Not on file    Outpatient Medications Prior to Visit  Medication Sig Dispense Refill   cetirizine (ZYRTEC) 10 MG tablet Take 10 mg by mouth daily as needed for allergies.     Cholecalciferol (VITAMIN D3) 250 MCG (10000 UT) capsule Take 10,000 Units by mouth daily.     COLLAGEN PO Take 1 Scoop by mouth daily.     diclofenac Sodium (VOLTAREN) 1 % GEL Apply 2 g topically daily as needed (pain).     escitalopram (LEXAPRO) 5 MG tablet TAKE 1 TABLET BY MOUTH EVERY DAY 90 tablet 1   ferrous sulfate 324 (65 Fe) MG TBEC Take 1 tablet (325 mg total) by mouth daily.     fluticasone (FLONASE) 50 MCG/ACT nasal spray Place 2 sprays into both nostrils daily. (Patient taking differently: Place 2 sprays into both nostrils daily as needed for allergies.) 16 g 1   gabapentin (NEURONTIN) 300 MG capsule TAKE 1 CAPSULE BY MOUTH TWICE A DAY 180 capsule 1   hydrochlorothiazide (MICROZIDE) 12.5 MG capsule TAKE 1 CAPSULE BY MOUTH EVERY DAY 90 capsule 1   hydrOXYzine (ATARAX/VISTARIL) 25 MG tablet TAKE 1 TABLET BY MOUTH EVERYDAY AT BEDTIME 90 tablet 1   imipramine (TOFRANIL) 25 MG  tablet TAKE 1  TABLET BY MOUTH EVERYDAY AT BEDTIME 90 tablet 3   LORazepam (ATIVAN) 1 MG tablet Take 1 tablet (1 mg total) by mouth at bedtime.     methocarbamol (ROBAXIN) 500 MG tablet Take 1 tablet (500 mg total) by mouth every 8 (eight) hours as needed for muscle spasms. 30 tablet 0   Multiple Vitamins-Minerals (CENTRUM SILVER 50+WOMEN) TABS Take 1 tablet by mouth daily.     omeprazole (PRILOSEC) 20 MG capsule TAKE 1 CAPSULE BY MOUTH EVERY DAY 90 capsule 3   polyvinyl alcohol (LIQUIFILM TEARS) 1.4 % ophthalmic solution Place 1 drop into both eyes as needed for dry eyes.     vitamin C (ASCORBIC ACID) 500 MG tablet Take 500 mg by mouth daily.     Zinc Sulfate (ZINC 15 PO) Take 1 tablet by mouth daily.     No facility-administered medications prior to visit.    Allergies  Allergen Reactions   Cefotan [Cefotetan Disodium] Swelling   Elemental Sulfur Rash    Rash all over    Review of Systems  Constitutional:  Positive for fatigue and fever (subjectively). Negative for chills.  HENT:  Positive for postnasal drip, rhinorrhea and sore throat. Negative for congestion, ear discharge, ear pain, sinus pressure and sinus pain.   Respiratory:  Positive for cough (white/green) and shortness of breath (DOE).   Cardiovascular:  Negative for chest pain.  Gastrointestinal:  Negative for abdominal pain, diarrhea, nausea and vomiting.  Musculoskeletal:  Negative for arthralgias and myalgias.  Neurological:  Positive for headaches (hx migraine).      Objective:    Physical Exam Vitals and nursing note reviewed.  Constitutional:      Appearance: She is well-developed.  HENT:     Right Ear: Tympanic membrane and ear canal normal.     Left Ear: Tympanic membrane and ear canal normal.     Nose:     Right Sinus: No maxillary sinus tenderness or frontal sinus tenderness.     Left Sinus: No maxillary sinus tenderness or frontal sinus tenderness.     Mouth/Throat:     Mouth: Mucous membranes are  moist.  Cardiovascular:     Rate and Rhythm: Normal rate and regular rhythm.     Heart sounds: Normal heart sounds.  Pulmonary:     Effort: Pulmonary effort is normal.     Breath sounds: Normal breath sounds.  Abdominal:     General: Bowel sounds are normal.  Lymphadenopathy:     Cervical: No cervical adenopathy.  Skin:    General: Skin is warm.  Neurological:     Mental Status: She is alert.    BP 120/88    Pulse 65    Temp (!) 96.7 F (35.9 C)    Resp 14    Ht 5\' 4"  (1.626 m)    Wt 141 lb 4 oz (64.1 kg)    SpO2 99%    BMI 24.25 kg/m  Wt Readings from Last 3 Encounters:  07/22/21 141 lb 4 oz (64.1 kg)  05/24/21 137 lb 9.6 oz (62.4 kg)  05/10/21 137 lb 9.6 oz (62.4 kg)    Health Maintenance Due  Topic Date Due   HIV Screening  Never done   COVID-19 Vaccine (5 - Booster for Ferron series) 02/04/2021   MAMMOGRAM  05/25/2021    There are no preventive care reminders to display for this patient.   Lab Results  Component Value Date   TSH 1.30 04/26/2021   Lab Results  Component Value  Date   WBC 7.2 04/26/2021   HGB 13.8 04/26/2021   HCT 41.0 04/26/2021   MCV 91.8 04/26/2021   PLT 308.0 04/26/2021   Lab Results  Component Value Date   NA 138 04/26/2021   K 3.5 04/26/2021   CO2 33 (H) 04/26/2021   GLUCOSE 98 04/26/2021   BUN 11 04/26/2021   CREATININE 0.87 04/26/2021   BILITOT 0.4 04/08/2020   ALKPHOS 49 04/08/2020   AST 20 04/08/2020   ALT 12 04/08/2020   PROT 6.9 04/08/2020   ALBUMIN 4.3 04/08/2020   CALCIUM 9.7 04/26/2021   ANIONGAP 8 10/19/2016   GFR 72.41 04/26/2021   Lab Results  Component Value Date   CHOL 201 (H) 04/26/2021   Lab Results  Component Value Date   HDL 78.00 04/26/2021   Lab Results  Component Value Date   LDLCALC 103 (H) 04/26/2021   Lab Results  Component Value Date   TRIG 102.0 04/26/2021   Lab Results  Component Value Date   CHOLHDL 3 04/26/2021   Lab Results  Component Value Date   HGBA1C 5.7 03/07/2018        Assessment & Plan:   Problem List Items Addressed This Visit   None Visit Diagnoses     Upper respiratory tract infection, unspecified type    -  Primary   Relevant Medications   azithromycin (ZITHROMAX) 250 MG tablet   Acute cough       Relevant Medications   benzonatate (TESSALON) 100 MG capsule        Meds ordered this encounter  Medications   azithromycin (ZITHROMAX) 250 MG tablet    Sig: Take 2 tablets on day 1, then 1 tablet daily on days 2 through 5    Dispense:  6 tablet    Refill:  0    Order Specific Question:   Supervising Provider    Answer:   Loura Pardon A [[id number]]   benzonatate (TESSALON) 100 MG capsule    Sig: Take 1 capsule (100 mg total) by mouth 3 (three) times daily as needed for up to 7 days for cough.    Dispense:  21 capsule    Refill:  0    Order Specific Question:   Supervising Provider    Answer:   Loura Pardon A [[id number]]   This visit occurred during the SARS-CoV-2 public health emergency.  Safety protocols were in place, including screening questions prior to the visit, additional usage of staff PPE, and extensive cleaning of exam room while observing appropriate contact time as indicated for disinfecting solutions.    Romilda Garret, NP

## 2021-07-22 NOTE — Assessment & Plan Note (Signed)
Given patient's symptoms and symptoms and presentation will elect to treat with azithromycin 250 mg pack as instructed.  Reviewed signs and symptoms when patient is to be seen urgent or emergently.  Patient will follow up if no improvement

## 2021-09-18 ENCOUNTER — Other Ambulatory Visit: Payer: Self-pay | Admitting: Family Medicine

## 2021-09-20 NOTE — Telephone Encounter (Signed)
Refill request Hydroxyzine ?Last refill 04/05/21 #90/1 ?Last office visit 07/22/21 acute ?Upcoming appointment 10/25/21 ?

## 2021-10-16 ENCOUNTER — Other Ambulatory Visit: Payer: Self-pay | Admitting: Neurology

## 2021-10-25 ENCOUNTER — Encounter: Payer: Self-pay | Admitting: Family Medicine

## 2021-10-25 ENCOUNTER — Ambulatory Visit: Payer: BC Managed Care – PPO | Admitting: Family Medicine

## 2021-10-25 VITALS — BP 126/84 | HR 65 | Temp 98.0°F | Ht 64.0 in | Wt 145.0 lb

## 2021-10-25 DIAGNOSIS — M961 Postlaminectomy syndrome, not elsewhere classified: Secondary | ICD-10-CM

## 2021-10-25 DIAGNOSIS — F418 Other specified anxiety disorders: Secondary | ICD-10-CM | POA: Diagnosis not present

## 2021-10-25 DIAGNOSIS — Z7189 Other specified counseling: Secondary | ICD-10-CM

## 2021-10-25 DIAGNOSIS — G373 Acute transverse myelitis in demyelinating disease of central nervous system: Secondary | ICD-10-CM | POA: Diagnosis not present

## 2021-10-25 DIAGNOSIS — G894 Chronic pain syndrome: Secondary | ICD-10-CM

## 2021-10-25 DIAGNOSIS — M792 Neuralgia and neuritis, unspecified: Secondary | ICD-10-CM

## 2021-10-25 DIAGNOSIS — M545 Low back pain, unspecified: Secondary | ICD-10-CM | POA: Diagnosis not present

## 2021-10-25 DIAGNOSIS — R635 Abnormal weight gain: Secondary | ICD-10-CM

## 2021-10-25 DIAGNOSIS — M5416 Radiculopathy, lumbar region: Secondary | ICD-10-CM | POA: Diagnosis not present

## 2021-10-25 DIAGNOSIS — G8929 Other chronic pain: Secondary | ICD-10-CM

## 2021-10-25 DIAGNOSIS — G4700 Insomnia, unspecified: Secondary | ICD-10-CM

## 2021-10-25 MED ORDER — ACETAMINOPHEN-CODEINE 300-30 MG PO TABS: 1.0000 | ORAL_TABLET | Freq: Two times a day (BID) | ORAL | 0 refills | Status: DC | PRN

## 2021-10-25 NOTE — Patient Instructions (Addendum)
Try increasing lexapro to '10mg'$  daily.  Prescription sent for tylenol #3 (with codeine) to have on hand for breakthrough pain after plain tylenol. Don't mix with lorazepam.  If ongoing knee pain, schedule appointment with Dr Lorelei Pont for further evaluation.  Good to see you today.  Return as needed or in 6 months for follow up visit.

## 2021-10-25 NOTE — Progress Notes (Unsigned)
Patient ID: Elizabeth Long, female    DOB: 06-Nov-1960, 61 y.o.   MRN: [id number]  This visit was conducted in person.  BP 126/84   Pulse 65   Temp 98 F (36.7 C) (Temporal)   Ht '5\' 4"'$  (1.626 m)   Wt 145 lb (65.8 kg)   SpO2 99%   BMI 24.89 kg/m    CC: 6 mo f/u visit  Subjective:   HPI: Elizabeth Long is a 61 y.o. female presenting on 10/25/2021 for Follow-up (Here for 6 mo f/u.  Also, pt provided copy [to keep] of medical POA. 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 Also worried about cost/affordability of her medical care.  Mood - notes this is worsening, more irritable, longstanding on '5mg'$  lexapro. Notes chronic lower back pain is also limiting her activity (sweeping, cleaning the patio, etc). Continues imipramine '25mg'$  nightly for dysesthesias. Previously did not tolerate nortriptyline.   MS/transverse myelitis followed by Dr Felecia Shelling - has not recently seen.   Sees Dr Lacinda Axon NSG and [place] pain - spinal cord stimulator placement 05/2021 - feels this is helping, especially for neuropathy at night time. She continues tylenol but would like to have a prescription on hand for breakthrough pain. She's been squatting more recently - she's been wearing knee pads regularly. She asks about steroid injection to knees - has never received these in the past.   Last visit we discussed PCP taking over benzodiazepine Rx - she decided to stay with GYN Dr Stann Mainland.   Brings HCPOA form which will be updated. HCPOA is [name]. Does not want life prolonging measures if terminal condition.      Relevant past medical, surgical, family and social history reviewed and updated as indicated. Interim medical history since our last visit reviewed. Allergies and medications reviewed and updated. Outpatient Medications Prior to Visit  Medication Sig Dispense Refill   cetirizine (ZYRTEC) 10 MG tablet Take 10 mg by mouth daily as needed for allergies.      COLLAGEN PO Take 1 Scoop by mouth daily.     diclofenac Sodium (VOLTAREN) 1 % GEL Apply 2 g topically daily as needed (pain).     escitalopram (LEXAPRO) 5 MG tablet TAKE 1 TABLET BY MOUTH EVERY DAY 90 tablet 1   ferrous sulfate 324 (65 Fe) MG TBEC Take 1 tablet (325 mg total) by mouth daily.     fluticasone (FLONASE) 50 MCG/ACT nasal spray Place 2 sprays into both nostrils daily. (Patient taking differently: Place 2 sprays into both nostrils daily as needed for allergies.) 16 g 1   gabapentin (NEURONTIN) 300 MG capsule TAKE 1 CAPSULE BY MOUTH TWICE A DAY 180 capsule 1   hydrochlorothiazide (MICROZIDE) 12.5 MG capsule TAKE 1 CAPSULE BY MOUTH EVERY DAY 90 capsule 1   hydrOXYzine (ATARAX) 25 MG tablet TAKE 1 TABLET BY MOUTH EVERYDAY AT BEDTIME 90 tablet 1   imipramine (TOFRANIL) 25 MG tablet TAKE 1 TABLET BY MOUTH EVERYDAY AT BEDTIME 90 tablet 1   LORazepam (ATIVAN) 1 MG tablet Take 1 tablet (1 mg total) by mouth at bedtime.     methocarbamol (ROBAXIN) 500 MG tablet Take 1 tablet (500 mg total) by mouth every 8 (eight) hours as needed for muscle spasms. 30 tablet 0   Multiple Vitamins-Minerals (CENTRUM SILVER 50+WOMEN) TABS Take 1 tablet by mouth daily.     omeprazole (PRILOSEC) 20 MG capsule TAKE 1 CAPSULE BY MOUTH  EVERY DAY 90 capsule 3   polyvinyl alcohol (LIQUIFILM TEARS) 1.4 % ophthalmic solution Place 1 drop into both eyes as needed for dry eyes.     vitamin C (ASCORBIC ACID) 500 MG tablet Take 500 mg by mouth daily.     Zinc Sulfate (ZINC 15 PO) Take 1 tablet by mouth daily.     Cholecalciferol (VITAMIN D3) 250 MCG (10000 UT) capsule Take 10,000 Units by mouth daily.     No facility-administered medications prior to visit.     Per HPI unless specifically indicated in ROS section below Review of Systems  Objective:  BP 126/84   Pulse 65   Temp 98 F (36.7 C) (Temporal)   Ht '5\' 4"'$  (1.626 m)   Wt 145 lb (65.8 kg)   SpO2 99%   BMI 24.89 kg/m   Wt Readings from Last 3 Encounters:   10/25/21 145 lb (65.8 kg)  07/22/21 141 lb 4 oz (64.1 kg)  05/24/21 137 lb 9.6 oz (62.4 kg)      Physical Exam Vitals and nursing note reviewed.  Constitutional:      Appearance: Normal appearance. She is not ill-appearing.  Cardiovascular:     Rate and Rhythm: Normal rate and regular rhythm.     Pulses: Normal pulses.     Heart sounds: Normal heart sounds. No murmur heard. Pulmonary:     Effort: Pulmonary effort is normal. No respiratory distress.     Breath sounds: Normal breath sounds. No wheezing, rhonchi or rales.  Musculoskeletal:        General: Tenderness present.     Right lower leg: No edema.     Left lower leg: No edema.     Comments:  FROM bilateral knees Mild discomfort to palpation of R anterior knee inferolateral to patella, no pain at pes anserine bursa bilaterally No abnormal patellar mobility No patellofemoral grind  Skin:    General: Skin is warm and dry.     Findings: No rash.  Neurological:     Mental Status: She is alert.  Psychiatric:        Mood and Affect: Mood normal.        Behavior: Behavior normal.      Results for orders placed or performed during the hospital encounter of 05/24/21  ABO/Rh  Result Value Ref Range   ABO/RH(D)      A POS Performed at [facility], [place] [place],  [address]       04/26/2021    3:30 PM 04/14/2021    9:44 AM 04/07/2021    9:02 AM 03/29/2021    1:28 PM 04/15/2020    4:24 PM  Depression screen PHQ 2/9  Decreased Interest 0 0 0 0 1  Down, Depressed, Hopeless 0 0 0 0 0  PHQ - 2 Score 0 0 0 0 1  Altered sleeping 1    1  Tired, decreased energy 0    1  Change in appetite 0    0  Feeling bad or failure about yourself  0    0  Trouble concentrating 0    0  Moving slowly or fidgety/restless 0    0  Suicidal thoughts 0    0  PHQ-9 Score 1    3       04/26/2021    3:30 PM 04/15/2020    4:24 PM 05/23/2018    3:16 PM 03/14/2018    1:10 PM  GAD 7 : Generalized Anxiety Score  Nervous, Anxious, on Edge 1 1 0 2  Control/stop worrying 0 0 0 3  Worry too much - different things 1 0 0 3  Trouble relaxing 0 0 0 3  Restless 0 0 0 1  Easily annoyed or irritable 1 1 0 2  Afraid - awful might happen 0 0 0 0  Total GAD 7 Score 3 2 0 14   Assessment & Plan:   Problem List Items Addressed This Visit     Advanced directives, counseling/discussion - Primary (Chronic)    Advanced directive - scanned 10/2021. HCPOA is [name]. Does not want life prolonging measures if terminal condition.        Anxiety associated with depression    Situational deterioration in mood in setting of work stressors and chronic pain. Will double up on her lexapro 5 mg tablets to 2 a day, update with effect and if helpful, I will send in 10 mg dose. She continues imipramine '25mg'$  nightly as well without symptoms of orthostatic hypotension. She also continues to receive lorazepam 1 mg nightly through gynecology.       Chronic lower back pain    S/p spine stimulator placement 05/2021 with benefit.  Requests stronger pain medication for breakthrough pain - will Rx tylenol #3 to use sparingly. Discussed need to avoid mixing with benzodiazepine.  Avoid tramadol given she is on other serotonergic medications. [place] reviewed.       Relevant Medications   acetaminophen-codeine (TYLENOL #3) 300-30 MG tablet   Chronic radicular lumbar pain   Relevant Medications   acetaminophen-codeine (TYLENOL #3) 300-30 MG tablet   Transverse myelitis (HCC)    Transverse myelitis versus early multiple sclerosis, followed by neurology Dr. Rachel Moulds.  Appreciate his care.       Postlaminectomy syndrome, lumbar region   Neuropathic pain   Chronic pain syndrome   Relevant Medications   acetaminophen-codeine (TYLENOL #3) 300-30 MG tablet   Insomnia    Long-term lorazepam nightly for sleep, managed by gynecology.       Weight gain    She notes significant weight gain over the past year, possibly  correlating to imipramine commencement.  She will check with neurology on whether she needs to continue this medication now that she has a spinal cord stimulator in place.         Meds ordered this encounter  Medications   acetaminophen-codeine (TYLENOL #3) 300-30 MG tablet    Sig: Take 1 tablet by mouth 2 (two) times daily as needed for moderate pain.    Dispense:  20 tablet    Refill:  0   No orders of the defined types were placed in this encounter.    Patient Instructions  Try increasing lexapro to '10mg'$  daily.  Prescription sent for tylenol #3 (with codeine) to have on hand for breakthrough pain after plain tylenol. Don't mix with lorazepam.  If ongoing knee pain, schedule appointment with Dr Lorelei Pont for further evaluation.  Good to see you today.  Return as needed or in 6 months for follow up visit.   Follow up plan: Return in about 6 months (around 04/27/2022) for annual exam, prior fasting for blood work.  Ria Bush, MD

## 2021-10-26 DIAGNOSIS — R635 Abnormal weight gain: Secondary | ICD-10-CM | POA: Insufficient documentation

## 2021-10-26 NOTE — Assessment & Plan Note (Signed)
Long-term lorazepam nightly for sleep, managed by gynecology.

## 2021-10-26 NOTE — Assessment & Plan Note (Signed)
Transverse myelitis versus early multiple sclerosis, followed by neurology Dr. Rachel Moulds.  Appreciate his care.

## 2021-10-26 NOTE — Assessment & Plan Note (Signed)
Advanced directive - scanned 10/2021. HCPOA is [name]. Does not want life prolonging measures if terminal condition.

## 2021-10-26 NOTE — Assessment & Plan Note (Addendum)
She notes significant weight gain over the past year, possibly correlating to imipramine commencement.  She will check with neurology on whether she needs to continue this medication now that she has a spinal cord stimulator in place.

## 2021-10-26 NOTE — Assessment & Plan Note (Addendum)
S/p spine stimulator placement 05/2021 with benefit.  Requests stronger pain medication for breakthrough pain - will Rx tylenol #3 to use sparingly. Discussed need to avoid mixing with benzodiazepine.  Avoid tramadol given she is on other serotonergic medications. [place] reviewed.

## 2021-10-26 NOTE — Assessment & Plan Note (Addendum)
Situational deterioration in mood in setting of work stressors and chronic pain. Will double up on her lexapro 5 mg tablets to 2 a day, update with effect and if helpful, I will send in 10 mg dose. She continues imipramine '25mg'$  nightly as well without symptoms of orthostatic hypotension. She also continues to receive lorazepam 1 mg nightly through gynecology.

## 2021-11-10 ENCOUNTER — Ambulatory Visit: Payer: BC Managed Care – PPO | Admitting: Neurology

## 2021-11-10 ENCOUNTER — Encounter: Payer: Self-pay | Admitting: Neurology

## 2021-11-10 ENCOUNTER — Other Ambulatory Visit: Payer: Self-pay | Admitting: Family Medicine

## 2021-11-10 VITALS — BP 146/87 | HR 65 | Ht 64.0 in | Wt 146.5 lb

## 2021-11-10 DIAGNOSIS — H9313 Tinnitus, bilateral: Secondary | ICD-10-CM

## 2021-11-10 DIAGNOSIS — G894 Chronic pain syndrome: Secondary | ICD-10-CM

## 2021-11-10 DIAGNOSIS — G373 Acute transverse myelitis in demyelinating disease of central nervous system: Secondary | ICD-10-CM | POA: Diagnosis not present

## 2021-11-10 DIAGNOSIS — R29898 Other symptoms and signs involving the musculoskeletal system: Secondary | ICD-10-CM

## 2021-11-10 NOTE — Progress Notes (Signed)
[facility]  PATIENT: Elizabeth Long DOB: [birth year]  REFERRING DOCTOR OR PCP: Ria Bush MD SOURCE: Patient, notes from primary care, imaging and lab reports, MRI images of the cervical and lumbar spine personally reviewed  _________________________________   HISTORICAL  CHIEF COMPLAINT:  Chief Complaint  Patient presents with   Follow-up    Rm 1, alone. Here for yearly f/u. Pt would like to discuss her imipramine. Has been having ringing in ear. Having numbness in neck down to R arm. Having more fatigue.     HISTORY OF PRESENT ILLNESS:  Elizabeth Long is a 61 y.o. woman with numbness and gait issues and T2 hyperintense foci i spinal cord and brain.  Update 11/10/2021 --- Spinal Cord Stimulator was interrogated (she has Model [phone] Medtronic).   We turned on MRI mode and the programmer states she can have MRI of the full body safely.   She will need to have her Pain specialist complete a form and to remember to turn it off for the study (and back on afterwards).  She is getting  a ringing in her ears.   She saw her PCP and was told the ears looked ok.    She wonders if the imipramine could have played a role.         She has had some tingling and numbness in her arms.    A  spinal cord stimulator (Dr. Lacinda Axon [facility] at [facility] 2022) has helped the neuropathic type pain.   In preparation for the SCS , she had an MRI of the T-spine in 2021 and it showed a focus at T9 (sje has not had T-spie in past to compare) and another in the conus at L1 (old on prior lumbar MRI).   She [place] has a focus at [facility] not adjacent to DJD.    She has numbness in her feet since 2019 with dysesthesias feeling like sandpaper.  She also has a different dysesthetic    Imipramine had helped some and also helped sleep some.   Gabapentin is tolerated but less effective.      Currently, gait is fine but balance is mildly off.   She denies weakness.   She has numbness in both legs.   She has  incomplete bladder emptying.   She denies any visual disturbances.   She has fatigue.   She has insomnia and takes gabapentin (300 mg qHS) and lorazepam 2 mg   She notes depression and has work stress.        She is working doing Medical sales representative work.   She is noting that she is more forgetful than before and is no new difficulties with her focus and attention.  She has headaches and feels the imipramine has not helped them much and causes constipation and increases appetite    Imipramine helped the dysesthesias.     History of neurologic symptoms and subsequent studies: Around 2008, she began to experience symptoms in her neck and arm.  These would fluctuate.  In 2011, an MRI of the cervical spine showed a focus adjacent to C2-C3 towards the left.  At the time, she actually had more symptoms in the right arm.  At some points he had surgery at C5-C6 ([facility]).  She did well and have less symptoms for many years but then began to experience more numbness in the arms and legs.  In 2021, she was noted on MRI to have a T2 hyperintense focus adjacent to L1 and may be  a second 1 adjacent to T12 in the spinal cord.  She also had degenerative changes.  CSF 08/21/2019 did not show oligoclonal bands.  Repeat imaging in 2020 one of the cervical spine just showed a 1 focus, diffusion and progressive degenerative changes C4-C5.  MRI of the brain shows only a few T2 hyperintense foci.  I reviewed imaging from 2008, 2011, and 2021.  The focus at C2-C3 appears to have developed between the 2008 and 2011 MRI of the cervical spine.  The focus in the conus medullaris appears to have occurred after the 2013 MRI and before the 2021 MRI.  There is only 1 MRI of the brain.   Studies:  Imaging:  MRI of the cervical spine 03/18/2010.  The cervical spine shows a left lateral focus at C2-C3 that could be consistent with a demyelinating plaque.  That focus was not present in 2008  MRI of the lumbar spine 07/18/2019  shows a focus adjacent to  T12 to the right and possibly 1 within the conus adjacent to L1 to the anterolaterally to the left.   At [facility] disc protrusion could affect left L3 nerve root and at L4L5 protrusion could affect left L5 nerve root.    MRI of the brain and cervical spine 08/13/2019 shows a T2 hyperintense focus within the spinal cord adjacent to C2-C3 which had been seen previously.  There are 2-3 hemispheric small foci, including one juxtacortical one and a focus within the pons.  She has ACDF at C5-C6.  At C4-C5 there is retrolisthesis and other degenerative changes causing foraminal narrowing but no nerve root compression.  This has progressed compared to the previous MRI from 2011.   MRI brain 10/04/2020 showed  Several T2/FLAIR hyperintense foci in the white matter of the hemispheres and in the right pons.  They do not enhance or appear to be acute.  Compared to the MRI from 08/13/2019, there are no new lesions.  Though the overall pattern is nonspecific, the one focus in the periventricular white matter and the other in the juxtacortical white matter on the left could be consistent with chronic demyelinating plaque associated with MS.  They could also represent age-related chronic microvascular ischemic change.  MRI cervical spine 10/14/2020 showed   T2 hyperintense focus within the spinal cord laterally to the left.  The location could be consistent with demyelination from multiple sclerosis.  It does not enhance and is unchanged compared to the 08/13/2019 MRI.  Another T2 hyperintense focus is noted within the pons, also unchanged. 2.   Remote fusion at C5-C6 3.   At C3-C4, there is 2 mm anterolisthesis and other degenerative changes causing moderately severe right and moderate left foraminal narrowing but no spinal stenosis.  There is potential for right C4 nerve root compression.  This level has slightly progressed compared to the 2021 MRI. 4.   At C4-C5, there are stable degenerative changes including 3 mm retrolisthesis.   There is moderate right foraminal narrowing but no nerve root compression.  MRI thoracic spine 03/25/2021 showed a focus at T9    Lumbar puncture 08/21/2019 did not show any bands in the CSF not present in the serum though there were 3 identical gamma restricted bands in the CSF and serum.  IgG index was normal at 0.53.    REVIEW OF SYSTEMS: Constitutional: No fevers, chills, sweats, or change in appetite.   Insomnia and fatigue Eyes: No visual changes, double vision, eye pain Ear, nose and throat: No hearing loss,  ear pain, nasal congestion, sore throat Cardiovascular: No chest pain, palpitations Respiratory:  No shortness of breath at rest or with exertion.   No wheezes GastrointestinaI: No nausea, vomiting, diarrhea, abdominal pain, fecal incontinence Genitourinary:  No dysuria, urinary retention or frequency.  No nocturia. Musculoskeletal:  Notes neck pain, back pain Integumentary: No rash, pruritus, skin lesions Neurological: as above Psychiatric: Notes depression and anxiety Endocrine: No palpitations, diaphoresis, change in appetite, change in weigh or increased thirst Hematologic/Lymphatic:  No anemia, purpura, petechiae. Allergic/Immunologic: No itchy/runny eyes, nasal congestion, recent allergic reactions, rashes  ALLERGIES: Allergies  Allergen Reactions   Cefotan [Cefotetan Disodium] Swelling   Elemental Sulfur Rash    Rash all over    HOME MEDICATIONS:  Current Outpatient Medications:    acetaminophen-codeine (TYLENOL #3) 300-30 MG tablet, Take 1 tablet by mouth 2 (two) times daily as needed for moderate pain., Disp: 20 tablet, Rfl: 0   cetirizine (ZYRTEC) 10 MG tablet, Take 10 mg by mouth daily as needed for allergies., Disp: , Rfl:    COLLAGEN PO, Take 1 Scoop by mouth daily., Disp: , Rfl:    diclofenac Sodium (VOLTAREN) 1 % GEL, Apply 2 g topically daily as needed (pain)., Disp: , Rfl:    escitalopram (LEXAPRO) 5 MG tablet, TAKE 1 TABLET BY MOUTH EVERY DAY  (Patient taking differently: Take 10 mg by mouth daily.), Disp: 90 tablet, Rfl: 1   ferrous sulfate 324 (65 Fe) MG TBEC, Take 1 tablet (325 mg total) by mouth daily., Disp: , Rfl:    fluticasone (FLONASE) 50 MCG/ACT nasal spray, Place 2 sprays into both nostrils daily. (Patient taking differently: Place 2 sprays into both nostrils daily as needed for allergies.), Disp: 16 g, Rfl: 1   gabapentin (NEURONTIN) 300 MG capsule, TAKE 1 CAPSULE BY MOUTH TWICE A DAY, Disp: 180 capsule, Rfl: 1   hydrochlorothiazide (MICROZIDE) 12.5 MG capsule, TAKE 1 CAPSULE BY MOUTH EVERY DAY, Disp: 90 capsule, Rfl: 3   hydrOXYzine (ATARAX) 25 MG tablet, TAKE 1 TABLET BY MOUTH EVERYDAY AT BEDTIME, Disp: 90 tablet, Rfl: 1   imipramine (TOFRANIL) 25 MG tablet, TAKE 1 TABLET BY MOUTH EVERYDAY AT BEDTIME, Disp: 90 tablet, Rfl: 1   LORazepam (ATIVAN) 1 MG tablet, Take 1 tablet (1 mg total) by mouth at bedtime., Disp: , Rfl:    methocarbamol (ROBAXIN) 500 MG tablet, Take 1 tablet (500 mg total) by mouth every 8 (eight) hours as needed for muscle spasms., Disp: 30 tablet, Rfl: 0   omeprazole (PRILOSEC) 20 MG capsule, TAKE 1 CAPSULE BY MOUTH EVERY DAY, Disp: 90 capsule, Rfl: 3   polyvinyl alcohol (LIQUIFILM TEARS) 1.4 % ophthalmic solution, Place 1 drop into both eyes as needed for dry eyes., Disp: , Rfl:    vitamin C (ASCORBIC ACID) 500 MG tablet, Take 500 mg by mouth daily., Disp: , Rfl:    Zinc Sulfate (ZINC 15 PO), Take 1 tablet by mouth daily., Disp: , Rfl:    Multiple Vitamins-Minerals (CENTRUM SILVER 50+WOMEN) TABS, Take 1 tablet by mouth daily., Disp: , Rfl:   PAST MEDICAL HISTORY: Past Medical History:  Diagnosis Date   Allergic rhinitis    Allergy    Anemia    as a teenager   Anxiety    history of panic attack   Anxiety associated with depression    Chronic headaches    migraines- not any longer   Depression    Essential hypertension 10/24/2015   Family history of adverse reaction to anesthesia  mother - N/V    GERD (gastroesophageal reflux disease)    History of alcoholism ([place])    Sober since 1994 Strong fmhx alcoholism   History of pneumonia    x2   Hyperlipidemia    IBS (irritable bowel syndrome)    Irritable bowel syndrome (IBS)    constipation   Microscopic colitis    Osteoarthritis 2000s   chronic back and shoulder pain s/p surgery   Pneumonia    had x 2 last time age 31   Post menopausal syndrome 10/24/2015   Substance abuse ([place])    alcohol abuse   Tubular adenoma of colon     PAST SURGICAL HISTORY: Past Surgical History:  Procedure Laterality Date   ABDOMINAL HYSTERECTOMY  1990   endometriosis   ANTERIOR CERVICAL DECOMP/DISCECTOMY FUSION  2006   C5/6 HNP ([place])   APPENDECTOMY     CHOLECYSTECTOMY N/A 10/19/2016   Procedure: LAPAROSCOPIC CHOLECYSTECTOMY;  Surgeon: Stark Klein, MD;  Location: [facility];  Service: General;  Laterality: N/A;   COLONOSCOPY  06/2011   TA, microscopic colitis, rpt 5 yrs ([place])   COLONOSCOPY  09/2016   26m polyp - tubular adenoma rpt 3 yrs ([place])   COLONOSCOPY  10/2019   132mHP, int hem, rpt 5 yrs ([place])   cyst on ovary     EYE SURGERY Bilateral    Lasik   LUMBAR DIOmarURGERY  2002   [place]   lumbar esi  2010   NOSE SURGERY     POLYPECTOMY     SHOULDER SURGERY Right    Rotatar Cuff   THORACIC LAMINECTOMY FOR SPINAL CORD STIMULATOR N/A 05/24/2021   Procedure: THORACIC SPINAL CORD STIMULATOR AND PULSE GENERATOR PLACEMENT ([employer]);  Surgeon: CoDeetta PerlaMD;  Location: [facility];  Service: Neurosurgery;  Laterality: N/A;   TONSILLECTOMY      FAMILY HISTORY: Family History  Problem Relation Age of Onset   Arthritis Mother    CAD Mother 7244     4v bypass   Arthritis Father    Hypertension Father    Alcohol abuse Brother    Colon polyps Brother    Heart disease Maternal Grandmother        CHF   Lung cancer Maternal Grandfather        (smoker)   Alcohol abuse Paternal Grandmother    Hyperlipidemia Paternal  Grandmother    CAD Paternal Grandmother        MI x3   Stroke Paternal Grandmother    Hypertension Paternal Grandmother    Alcohol abuse Paternal Grandfather    Arthritis Paternal Grandfather    Heart disease Paternal Grandfather    Stroke Paternal Grandfather    Diabetes Paternal Grandfather    Throat cancer Maternal Aunt    CAD Paternal Uncle        MI   Stroke Paternal Uncle    Throat cancer Maternal Uncle    Colon cancer Neg Hx    Esophageal cancer Neg Hx    Stomach cancer Neg Hx    Rectal cancer Neg Hx     SOCIAL HISTORY:  Social History   Socioeconomic History   Marital status: Single    Spouse name: Not on file   Number of children: 0   Years of education: Not on file   Highest education level: Not on file  Occupational History   Occupation: CLERK CLASS 1    Employer: [employer]    Comment: full time  Tobacco Use  Smoking status: Former    Years: 30.00    Types: Cigarettes    Quit date: 04/11/2000    Years since quitting: 21.5   Smokeless tobacco: Never  Vaping Use   Vaping Use: Never used  Substance and Sexual Activity   Alcohol use: No    Alcohol/week: 0.0 standard drinks    Comment: hx   Drug use: No   Sexual activity: Yes  Other Topics Concern   Not on file  Social History Narrative   Daily caffeine- coffee (3-4 cups per day)   Right handed    Lives alone   Social Determinants of Health   Financial Resource Strain: Not on file  Food Insecurity: Not on file  Transportation Needs: Not on file  Physical Activity: Not on file  Stress: Not on file  Social Connections: Not on file  Intimate Partner Violence: Not on file     PHYSICAL EXAM  Vitals:   11/10/21 1543  BP: (!) 146/87  Pulse: 65  Weight: 146 lb 8 oz (66.5 kg)  Height: '5\' 4"'$  (1.626 m)    Body mass index is 25.15 kg/m.   General: The patient is well-developed and well-nourished and in no acute distress  HEENT:  Head is [place]/AT.  Sclera are anicteric.  Tympanic  membranes are intact.  Neck:    The neck is nontender.   Skin: Extremities are without rash or  edema.   Neurologic Exam  Mental status: The patient is alert and oriented x 3 at the time of the examination. The patient has apparent normal recent and remote memory, with an apparently normal attention span and concentration ability.   Speech is normal.  Cranial nerves: Extraocular movements are full.  . There is good facial sensation to soft touch bilaterally.Facial strength is normal.  Trapezius and sternocleidomastoid strength is normal. No dysarthria is noted.   No obvious hearing deficits are noted.  The Weber did not lateralize  Motor:  Muscle bulk is normal.   Tone is normal. Strength is  5 / 5 in right arm and 4+/5 in triceps and pronators but 5/5 elsewhere in left arm .  4+/5 in left EHL muscle of the foot 5/5 elsewhere  Sensory: Sensory testing is variable with reduced touch in legs vs arms.   Vibration is symmetric and same in arms and legs   Coordination: Cerebellar testing reveals good finger-nose-finger and mildly reduced heel-to-shin bilaterally.  Gait and station: Station is normal.   Gait is mildly wide and tandem is wide.  Romberg is negative.  Reflexes: Deep tendon reflexes are symmetric and normal in arms but increased at knees, left > right. No clonus  Plantar responses are flexor.    DIAGNOSTIC DATA (LABS, IMAGING, TESTING) - I reviewed patient records, labs, notes, testing and imaging myself where available.  Lab Results  Component Value Date   WBC 7.2 04/26/2021   HGB 13.8 04/26/2021   HCT 41.0 04/26/2021   MCV 91.8 04/26/2021   PLT 308.0 04/26/2021      Component Value Date/Time   NA 138 04/26/2021 1533   NA 139 10/24/2012 0000   K 3.5 04/26/2021 1533   K 4.1 10/24/2012 0000   CL 96 04/26/2021 1533   CO2 33 (H) 04/26/2021 1533   GLUCOSE 98 04/26/2021 1533   BUN 11 04/26/2021 1533   CREATININE 0.87 04/26/2021 1533   CREATININE 0.95 11/01/2019 1554    CALCIUM 9.7 04/26/2021 1533   CALCIUM 10.5 10/24/2012 0000   PROT  6.9 04/08/2020 0741   ALBUMIN 4.3 04/08/2020 0741   ALBUMIN 4.1 08/21/2019 0913   AST 20 04/08/2020 0741   ALT 12 04/08/2020 0741   ALKPHOS 49 04/08/2020 0741   BILITOT 0.4 04/08/2020 0741   GFRNONAA >60 10/19/2016 0955   GFRAA >60 10/19/2016 0955   Lab Results  Component Value Date   CHOL 201 (H) 04/26/2021   HDL 78.00 04/26/2021   LDLCALC 103 (H) 04/26/2021   TRIG 102.0 04/26/2021   CHOLHDL 3 04/26/2021   Lab Results  Component Value Date   HGBA1C 5.7 03/07/2018   Lab Results  Component Value Date   VITAMINB12 939 (H) 04/26/2021   Lab Results  Component Value Date   TSH 1.30 04/26/2021       ASSESSMENT AND PLAN  Transverse myelitis (HCC)  Right leg weakness  Chronic pain syndrome  Tinnitus of both ears   1.   She has 3 foci worrisome for MS in spinal cord but brain is normal for age.    CSF did not show OCB.   Our plan is to check MRI one more time in 2024 (brain) to determine if any progression - if none, MS is very unlikely.   2.   She will discontinue imipramine.   Can increase gabapentin if tingling worsens,    3.   Spinal Cord Stimulator was interrogated (she has Model [phone] [employer]tronic).   We turned on MRI mode and the programmer states she can have MRI of the full body safely.   She will need to have her Pain specialist complete a form and to remember to turn it off for the study (and back on afterwards). Will do in a 1.5 T 3.   Return in 6 months or sooner if there are new or worsening neurologic symptoms.     Hanah Moultry A. Felecia Shelling, MD, [facility] 4/0/1027, 2:53 PM Certified in Neurology, Clinical Neurophysiology, Sleep Medicine and Neuroimaging  [facility] [address] [place], [place] [address] [phone]

## 2021-11-23 ENCOUNTER — Telehealth: Payer: Self-pay | Admitting: Student in an Organized Health Care Education/Training Program

## 2021-11-23 NOTE — Telephone Encounter (Signed)
SCS is not relieving the pain. She is wondering if the leads could be misplaced. She has a call in to [employer]. Is there anything we can do to check?
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
## 2021-11-28 ENCOUNTER — Other Ambulatory Visit: Payer: Self-pay | Admitting: Family Medicine

## 2021-12-02 ENCOUNTER — Encounter: Payer: Self-pay | Admitting: Student in an Organized Health Care Education/Training Program

## 2021-12-02 ENCOUNTER — Ambulatory Visit: Payer: BC Managed Care – PPO | Admitting: Student in an Organized Health Care Education/Training Program

## 2021-12-02 ENCOUNTER — Ambulatory Visit
Admission: RE | Admit: 2021-12-02 | Discharge: 2021-12-02 | Disposition: A | Discharge status: Home / Self Care | Payer: BC Managed Care – PPO | Source: Ambulatory Visit | Attending: Student in an Organized Health Care Education/Training Program | Admitting: Student in an Organized Health Care Education/Training Program

## 2021-12-02 ENCOUNTER — Other Ambulatory Visit: Payer: Self-pay | Admitting: Student in an Organized Health Care Education/Training Program

## 2021-12-02 VITALS — BP 141/84 | HR 86 | Temp 98.1°F | Resp 18 | Ht 64.0 in | Wt 146.0 lb

## 2021-12-02 DIAGNOSIS — G8929 Other chronic pain: Secondary | ICD-10-CM

## 2021-12-02 DIAGNOSIS — G894 Chronic pain syndrome: Secondary | ICD-10-CM

## 2021-12-02 DIAGNOSIS — M961 Postlaminectomy syndrome, not elsewhere classified: Secondary | ICD-10-CM

## 2021-12-02 DIAGNOSIS — M4316 Spondylolisthesis, lumbar region: Secondary | ICD-10-CM | POA: Insufficient documentation

## 2021-12-02 DIAGNOSIS — M546 Pain in thoracic spine: Secondary | ICD-10-CM | POA: Diagnosis not present

## 2021-12-02 DIAGNOSIS — M5416 Radiculopathy, lumbar region: Secondary | ICD-10-CM

## 2021-12-02 DIAGNOSIS — M545 Low back pain, unspecified: Secondary | ICD-10-CM | POA: Diagnosis not present

## 2021-12-02 DIAGNOSIS — M47816 Spondylosis without myelopathy or radiculopathy, lumbar region: Secondary | ICD-10-CM | POA: Diagnosis not present

## 2021-12-02 NOTE — Progress Notes (Signed)
Safety precautions to be maintained throughout the outpatient stay will include: orient to surroundings, keep bed in low position, maintain call bell within reach at all times, provide assistance with transfer out of bed and ambulation.  
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
## 2021-12-09 ENCOUNTER — Telehealth: Payer: Self-pay | Admitting: *Deleted
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
## 2021-12-09 NOTE — Telephone Encounter (Signed)
-----   Message from Gillis Santa, MD sent at 12/09/2021  8:54 AM EDT ----- Regarding: Please call patient with thoracic x-ray results, let her know that spinal cord stimulator lead is intact Call patient and inform her that her spinal cord stimulator lead is in the correct spot and that there is no evidence of lead fracture.  Please assess how she is doing.  If her back pain is not getting better within the next 2 to 3 weeks, she needs to make a follow-up visit with me. ----- Message ----- From: Interface, Rad Results In Sent: 12/03/2021   5:51 PM EDT To: Gillis Santa, MD

## 2021-12-14 ENCOUNTER — Ambulatory Visit: Payer: BC Managed Care – PPO | Admitting: Neurology

## 2021-12-14 ENCOUNTER — Ambulatory Visit: Payer: BC Managed Care – PPO | Admitting: Family Medicine

## 2021-12-14 NOTE — Progress Notes (Unsigned)
    Elizabeth Long T. Yuritzi [name], MD, [place] at [facility] Elizabeth Long, [address]  Phone: [phone]  FAX: [name] - 61 y.o. female  MRN [id number]  Date of Birth: [birth year]  Date: 12/15/2021  PCP: Ria Bush, MD  Referral: Ria Bush, MD  No chief complaint on file.  Subjective:   Elizabeth Long is a 61 y.o. very pleasant female patient with There is no height or weight on file to calculate BMI. who presents with the following:  Patient presents with bilateral knee pain.    Review of Systems is noted in the HPI, as appropriate  Objective:   There were no vitals taken for this visit.  GEN: No acute distress; alert,appropriate. PULM: Breathing comfortably in no respiratory distress PSYCH: Normally interactive.   Laboratory and Imaging Data:  Assessment and Plan:   ***

## 2021-12-15 ENCOUNTER — Encounter: Payer: Self-pay | Admitting: Family Medicine

## 2021-12-15 ENCOUNTER — Ambulatory Visit: Payer: BC Managed Care – PPO | Admitting: Family Medicine

## 2021-12-15 VITALS — BP 120/70 | HR 66 | Temp 98.3°F | Ht 64.0 in | Wt 145.2 lb

## 2021-12-15 DIAGNOSIS — M170 Bilateral primary osteoarthritis of knee: Secondary | ICD-10-CM | POA: Diagnosis not present

## 2021-12-15 DIAGNOSIS — M5412 Radiculopathy, cervical region: Secondary | ICD-10-CM | POA: Diagnosis not present

## 2021-12-15 MED ORDER — TRIAMCINOLONE ACETONIDE 40 MG/ML IJ SUSP
40.0000 mg | Freq: Once | INTRAMUSCULAR | Status: AC
  Administered 2021-12-15: 40 mg via INTRA_ARTICULAR

## 2021-12-15 MED ORDER — TRIAMCINOLONE ACETONIDE 40 MG/ML IJ SUSP
40.0000 mg | Freq: Once | INTRAMUSCULAR | Status: AC
  Administered 2021-12-15: 40 mg via INTRAMUSCULAR

## 2022-01-20 ENCOUNTER — Other Ambulatory Visit: Payer: Self-pay | Admitting: Family Medicine

## 2022-01-20 NOTE — Telephone Encounter (Signed)
Gabapentin Last filled:  07/15/21, #180 with 1 refill Last OV: 12/15/21 acute Next OV:  05/02/22 CPE

## 2022-02-02 ENCOUNTER — Other Ambulatory Visit: Payer: Self-pay | Admitting: Neurology

## 2022-02-02 ENCOUNTER — Other Ambulatory Visit: Payer: Self-pay | Admitting: Family Medicine

## 2022-03-16 DIAGNOSIS — L820 Inflamed seborrheic keratosis: Secondary | ICD-10-CM | POA: Diagnosis not present

## 2022-03-16 DIAGNOSIS — D2372 Other benign neoplasm of skin of left lower limb, including hip: Secondary | ICD-10-CM | POA: Diagnosis not present

## 2022-03-17 ENCOUNTER — Ambulatory Visit
Payer: BC Managed Care – PPO | Attending: Student in an Organized Health Care Education/Training Program | Admitting: Student in an Organized Health Care Education/Training Program

## 2022-03-17 ENCOUNTER — Encounter: Payer: Self-pay | Admitting: Student in an Organized Health Care Education/Training Program

## 2022-03-17 VITALS — BP 129/77 | HR 71 | Temp 97.0°F | Resp 16 | Ht 63.0 in | Wt 138.0 lb

## 2022-03-17 DIAGNOSIS — G894 Chronic pain syndrome: Secondary | ICD-10-CM | POA: Insufficient documentation

## 2022-03-17 DIAGNOSIS — M5416 Radiculopathy, lumbar region: Secondary | ICD-10-CM | POA: Diagnosis not present

## 2022-03-17 DIAGNOSIS — M961 Postlaminectomy syndrome, not elsewhere classified: Secondary | ICD-10-CM | POA: Insufficient documentation

## 2022-03-17 DIAGNOSIS — M792 Neuralgia and neuritis, unspecified: Secondary | ICD-10-CM | POA: Diagnosis not present

## 2022-03-17 DIAGNOSIS — G8929 Other chronic pain: Secondary | ICD-10-CM | POA: Diagnosis not present

## 2022-03-17 DIAGNOSIS — M546 Pain in thoracic spine: Secondary | ICD-10-CM | POA: Diagnosis not present

## 2022-03-17 MED ORDER — GABAPENTIN 400 MG PO CAPS: 400.0000 mg | ORAL_CAPSULE | Freq: Two times a day (BID) | ORAL | 2 refills | Status: DC

## 2022-03-17 MED ORDER — MELOXICAM 15 MG PO TABS: 15.0000 mg | ORAL_TABLET | Freq: Every day | ORAL | 0 refills | Status: AC

## 2022-03-17 NOTE — Progress Notes (Signed)
Safety precautions to be maintained throughout the outpatient stay will include: orient to surroundings, keep bed in low position, maintain call bell within reach at all times, provide assistance with transfer out of bed and ambulation.  

## 2022-03-17 NOTE — Patient Instructions (Signed)
Meloxicam Capsules What is this medication? MELOXICAM (mel OX i cam) treats mild to moderate pain, inflammation, or arthritis. It works by decreasing inflammation. It belongs to a group of medications called NSAIDs. This medicine may be used for other purposes; ask your health care provider or pharmacist if you have questions. COMMON BRAND NAME(S): Vivlodex What should I tell my care team before I take this medication? They need to know if you have any of these conditions: Asthma (lung or breathing disease) Bleeding disorder Coronary artery bypass graft (CABG) within the past 2 weeks Dehydration Heart attack Heart disease Heart failure High blood pressure If you often drink alcohol Kidney disease Liver disease Smoke tobacco cigarettes Stomach bleeding Stomach ulcers, other stomach or intestine problems Take medications that treat or prevent blood clots Taking other steroids like dexamethasone or prednisone An unusual or allergic reaction to meloxicam, other medications, foods, dyes, or preservatives Pregnant or trying to get pregnant Breast-feeding How should I use this medication? Take this medication by mouth. Take it as directed on the prescription label at the same time every day. You can take it with or without food. If it upsets your stomach, take it with food. Do not use it more often than directed. There may be unused or extra doses in the bottle after you finish your treatment. Talk to your care team if you have questions about your dose. A special MedGuide will be given to you by the pharmacist with each prescription and refill. Be sure to read this information carefully each time. Talk to your care team about the use of this medication in children. Special care may be needed. Patients over 50 years of age may have a stronger reaction and need a smaller dose. Overdosage: If you think you have taken too much of this medicine contact a poison control center or emergency room at  once. NOTE: This medicine is only for you. Do not share this medicine with others. What if I miss a dose? If you miss a dose, take it as soon as you can. If it is almost time for your next dose, take only that dose. Do not take double or extra doses. What may interact with this medication? Do not take this medication with any of the following: Cidofovir Ketorolac This medication may also interact with the following: Aspirin and aspirin-like medications Certain medications for blood pressure, heart disease, irregular heart beat Certain medications for depression, anxiety, or psychotic disturbances Certain medications that treat or prevent blood clots like warfarin, enoxaparin, dalteparin, apixaban, dabigatran, rivaroxaban Cyclosporine Diuretics Fluconazole Lithium Methotrexate Other NSAIDs, medications for pain and inflammation, like ibuprofen and naproxen Pemetrexed This list may not describe all possible interactions. Give your health care provider a list of all the medicines, herbs, non-prescription drugs, or dietary supplements you use. Also tell them if you smoke, drink alcohol, or use illegal drugs. Some items may interact with your medicine. What should I watch for while using this medication? Visit your care team for regular checks on your progress. Tell your care team if your symptoms do not start to get better or if they get worse. Do not take other medications that contain aspirin, ibuprofen, or naproxen with this medication. Side effects such as stomach upset, nausea, or ulcers may be more likely to occur. Many non-prescription medications contain aspirin, ibuprofen, or naproxen. Always read labels carefully. This medication can cause serious ulcers and bleeding in the stomach. It can happen with no warning. Smoking, drinking alcohol, older age, and  poor health can also increase risks. Call your care team right away if you have stomach pain or blood in your vomit or stool. This  medication does not prevent a heart attack or stroke. This medication may increase the chance of a heart attack or stroke. The chance may increase the longer you use this medication or if you have heart disease. If you take aspirin to prevent a heart attack or stroke, talk to your care team about using this medication. Alcohol may interfere with the effect of this medication. Avoid alcoholic drinks. This medication may cause serious skin reactions. They can happen weeks to months after starting the medication. Contact your care team right away if you notice fevers or flu-like symptoms with a rash. The rash may be red or purple and then turn into blisters or peeling of the skin. Or, you might notice a red rash with swelling of the face, lips or lymph nodes in your neck or under your arms. Talk to your care team if you are pregnant before taking this medication. Taking this medication between weeks 20 and 30 of pregnancy may harm your unborn baby. Your care team will monitor you closely if you need to take it. After 30 weeks of pregnancy, do not take this medication. You may get drowsy or dizzy. Do not drive, use machinery, or do anything that needs mental alertness until you know how this medication affects you. Do not stand up or sit up quickly, especially if you are an older patient. This reduces the risk of dizzy or fainting spells. Be careful brushing or flossing your teeth or using a toothpick because you may get an infection or bleed more easily. If you have any dental work done, tell your dentist you are receiving this medication. This medication may make it more difficult to get pregnant. Talk to your care team if you are concerned about your fertility. What side effects may I notice from receiving this medication? Side effects that you should report to your care team as soon as possible: Allergic reactions--skin rash, itching, hives, swelling of the face, lips, tongue, or throat Bleeding--bloody or  black, tar-like stools, vomiting blood or brown material that looks like coffee grounds, red or dark brown urine, small red or purple spots on skin, unusual bruising or bleeding Heart attack--pain or tightness in the chest, shoulders, arms, or jaw, nausea, shortness of breath, cold or clammy skin, feeling faint or lightheaded Heart failure--shortness of breath, swelling of ankles, feet, or hands, sudden weight gain, unusual weakness or fatigue Increase in blood pressure Kidney injury--decrease in the amount of urine, swelling of the ankles, hands, or feet Liver injury--right upper belly pain, loss of appetite, nausea, light-colored stool, dark yellow or brown urine, yellowing skin or eyes, unusual weakness or fatigue Rash, fever, and swollen lymph nodes Redness, blistering, peeling, or loosening of the skin, including inside the mouth Stroke--sudden numbness or weakness of the face, arm, or leg, trouble speaking, confusion, trouble walking, loss of balance or coordination, dizziness, severe headache, change in vision Side effects that usually do not require medical attention (report to your care team if they continue or are bothersome): Diarrhea Nausea Upset stomach This list may not describe all possible side effects. Call your doctor for medical advice about side effects. You may report side effects to FDA at 1-800-FDA-1088. Where should I keep my medication? Keep out of the reach of children and pets. Store at room temperature between 20 and 25 degrees C (68 and  77 degrees F). Keep this medication in the original container. Protect from moisture. Keep the container tightly closed. Get rid of any unused medication after the expiration date. To get rid of medications that are no longer needed or have expired: Take the medication to a medication take-back program. Check with your pharmacy or law enforcement to find a location. If you cannot return the medication, check the label or package insert  to see if the medication should be thrown out in the garbage or flushed down the toilet. If you are not sure, ask your care team. If it is safe to put it in the trash, empty the medication out of the container. Mix the medication with cat litter, dirt, coffee grounds, or other unwanted substance. Seal the mixture in a bag or container. Put it in the trash. NOTE: This sheet is a summary. It may not cover all possible information. If you have questions about this medicine, talk to your doctor, pharmacist, or health care provider.  2023 Elsevier/Gold Standard (2020-08-19 00:00:00)

## 2022-03-17 NOTE — Progress Notes (Signed)
PROVIDER NOTE: Information contained herein reflects review and annotations entered in association with encounter. Interpretation of such information and data should be left to medically-trained personnel. Information provided to patient can be located elsewhere in the medical record under "Patient Instructions". Document created using STT-dictation technology, any transcriptional errors that may result from process are unintentional.    Patient: Elizabeth Long  Service Category: E/M  Provider: Gillis Santa, MD  DOB: Apr 01, 1961  DOS: 03/17/2022  Referring Provider: Ria Bush, MD  MRN: [id number]  Specialty: Interventional Pain Management  PCP: Ria Bush, MD  Type: Established Patient  Setting: Ambulatory outpatient    Location: Office  Delivery: Face-to-face     HPI  [name], a 61 y.o. year old female, is here today because of her Thoracic spine pain [M54.6]. [name] primary complain today is Back Pain (low) and Leg Pain (right) Last encounter: My last encounter with her was on 12/02/2021.  Pain Assessment: Severity of Chronic pain is reported as a 3 /10. Location: Back Lower/radiates down right leg to toes on the side and back. Onset: More than a month ago. Quality: Throbbing, Aching. Timing: Intermittent. Modifying factor(s): nothing. Vitals:  height is 5' 3"  (1.6 m) and weight is 138 lb (62.6 kg). Her temperature is 97 F (36.1 C) (abnormal). Her blood pressure is 129/77 and her pulse is 71. Her respiration is 16 and oxygen saturation is 98%.   Reason for encounter:   Elizabeth Long presents today with low back pain with radiation down to her right leg and toes.  She has a history of prior lumbar spine surgery, L4-L5 laminectomy.  She is status post spinal cord stimulator implant however she had an accident at the beginning of June which is still causing her to have increased pain.  She states that she is also dealing with increased stress and has gone back to work.  She  states that the pain does radiate down her legs makes it difficult to complete her chores and work activities by the end of the day.  She is currently on gabapentin 300 mg twice a day without any side effects.  We discussed increasing dose to 400 mg twice a day.  Also recommend a 30-day course of meloxicam 15 mg daily.  Continue with spinal cord stimulation.  She also received intra-articular knee injections from her primary care provider which she states provided some pain relief.  ROS  Constitutional: Denies any fever or chills Gastrointestinal: No reported hemesis, hematochezia, vomiting, or acute GI distress Musculoskeletal:  Low back pain with radiation into right leg Neurological: No reported episodes of acute onset apraxia, aphasia, dysarthria, agnosia, amnesia, paralysis, loss of coordination, or loss of consciousness  Medication Review  Centrum Silver 50+Women, Collagen, acetaminophen-codeine, cetirizine, diclofenac Sodium, escitalopram, ferrous sulfate, fluticasone, gabapentin, hydrOXYzine, hydrochlorothiazide, imipramine, meloxicam, methocarbamol, omeprazole, and polyvinyl alcohol  History Review  Allergy: [name] is allergic to cefotan [cefotetan disodium] and elemental sulfur. Drug: [name]  reports no history of drug use. Alcohol:  reports no history of alcohol use. Tobacco:  reports that she quit smoking about 21 years ago. Her smoking use included cigarettes. She has never used smokeless tobacco. Social: [name]  reports that she quit smoking about 21 years ago. Her smoking use included cigarettes. She has never used smokeless tobacco. She reports that she does not drink alcohol and does not use drugs. Medical:  has a past medical history of Allergic rhinitis, Allergy, Anemia, Anxiety, Anxiety associated with depression, Chronic  headaches, Depression, Essential hypertension (10/24/2015), Family history of adverse reaction to anesthesia, GERD (gastroesophageal reflux  disease), History of alcoholism ([place]), History of pneumonia, Hyperlipidemia, IBS (irritable bowel syndrome), Irritable bowel syndrome (IBS), Microscopic colitis, Osteoarthritis (2000s), Pneumonia, Post menopausal syndrome (10/24/2015), Substance abuse ([place]), and Tubular adenoma of colon. Surgical: [name]  has a past surgical history that includes Anterior cervical decomp/discectomy fusion (2006); Lumbar disc surgery (2002); Appendectomy; Abdominal hysterectomy (1990); Shoulder surgery (Right); Tonsillectomy; Nose surgery; Colonoscopy (06/2011); lumbar esi (2010); Polypectomy; cyst on ovary; Colonoscopy (09/2016); Eye surgery (Bilateral); Cholecystectomy (N/A, 10/19/2016); Colonoscopy (10/2019); and Thoracic laminectomy for spinal cord stimulator (N/A, 05/24/2021). Family: family history includes Alcohol abuse in her brother, paternal grandfather, and paternal grandmother; Arthritis in her father, mother, and paternal grandfather; CAD in her paternal grandmother and paternal uncle; CAD (age of onset: 56) in her mother; Colon polyps in her brother; Diabetes in her paternal grandfather; Heart disease in her maternal grandmother and paternal grandfather; Hyperlipidemia in her paternal grandmother; Hypertension in her father and paternal grandmother; Lung cancer in her maternal grandfather; Stroke in her paternal grandfather, paternal grandmother, and paternal uncle; Throat cancer in her maternal aunt and maternal uncle.  Laboratory Chemistry Profile   Renal Lab Results  Component Value Date   BUN 11 04/26/2021   CREATININE 0.87 02/26/3006   BCR NOT APPLICABLE 62/26/3335   GFR 72.41 04/26/2021   GFRAA >60 10/19/2016   GFRNONAA >60 10/19/2016    Hepatic Lab Results  Component Value Date   AST 20 04/08/2020   ALT 12 04/08/2020   ALBUMIN 4.3 04/08/2020   ALKPHOS 49 04/08/2020   HCVAB NEGATIVE 02/10/2016    Electrolytes Lab Results  Component Value Date   NA 138 04/26/2021   K 3.5 04/26/2021    CL 96 04/26/2021   CALCIUM 9.7 04/26/2021    Bone Lab Results  Component Value Date   VD25OH 38.44 03/07/2018    Inflammation (CRP: Acute Phase) (ESR: Chronic Phase) Lab Results  Component Value Date   ESRSEDRATE 12 03/13/2019         Note: Above Lab results reviewed.  Recent Imaging Review  DG Lumbar Spine Complete W/Bend CLINICAL DATA:  Upper back pain and/or thoracic spine pain.; Low back pain  EXAM: THORACIC SPINE 2 VIEWS; LUMBAR SPINE - COMPLETE WITH BENDING VIEWS  COMPARISON:  08/23/2020 MRI lumbar spine  FINDINGS: Limited evaluation due to overlapping osseous structures and overlying soft tissues.  There is no evidence of thoracolumbar spine fracture. Moderate to severe intervertebral disc space narrowing and facet arthropathy at the L4-L5 L5-S1 levels.  No other significant bone abnormalities are identified.  Straightening of the normal lumbar lordosis likely due to positioning and degenerative changes. Stable mild retrolisthesis of L4 on L5. Otherwise alignment of the thoracolumbar spine is normal.  Right upper quadrant surgical clips. Neurostimulator with leads entering the posterior central canal at the T12-L1 level and tips terminating at the T7-T10 levels.  IMPRESSION: 1. No acute displaced fracture or traumatic listhesis of the thoracolumbar spine. 2. L4 through S1 moderate severe degenerative changes with stable mild retrolisthesis of L4 on L5.  Electronically Signed   By: Iven Finn M.D.   On: 12/03/2021 17:48 DG Thoracic Spine 2 View CLINICAL DATA:  Upper back pain and/or thoracic spine pain.; Low back pain  EXAM: THORACIC SPINE 2 VIEWS; LUMBAR SPINE - COMPLETE WITH BENDING VIEWS  COMPARISON:  08/23/2020 MRI lumbar spine  FINDINGS: Limited evaluation due to overlapping osseous structures and overlying soft tissues.  There is no evidence of thoracolumbar spine fracture. Moderate to severe intervertebral disc space narrowing  and facet arthropathy at the L4-L5 L5-S1 levels.  No other significant bone abnormalities are identified.  Straightening of the normal lumbar lordosis likely due to positioning and degenerative changes. Stable mild retrolisthesis of L4 on L5. Otherwise alignment of the thoracolumbar spine is normal.  Right upper quadrant surgical clips. Neurostimulator with leads entering the posterior central canal at the T12-L1 level and tips terminating at the T7-T10 levels.  IMPRESSION: 1. No acute displaced fracture or traumatic listhesis of the thoracolumbar spine. 2. L4 through S1 moderate severe degenerative changes with stable mild retrolisthesis of L4 on L5.  Electronically Signed   By: Iven Finn M.D.   On: 12/03/2021 17:48 Note: Reviewed        Physical Exam  General appearance: Well nourished, well developed, and well hydrated. In no apparent acute distress Mental status: Alert, oriented x 3 (person, place, & time)       Respiratory: No evidence of acute respiratory distress Eyes: PERLA Vitals: BP 129/77   Pulse 71   Temp (!) 97 F (36.1 C)   Resp 16   Ht 5' 3"  (1.6 m)   Wt 138 lb (62.6 kg)   SpO2 98%   BMI 24.45 kg/m  BMI: Estimated body mass index is 24.45 kg/m as calculated from the following:   Height as of this encounter: 5' 3"  (1.6 m).   Weight as of this encounter: 138 lb (62.6 kg). Ideal: Ideal body weight: 52.4 kg (115 lb 8.3 oz) Adjusted ideal body weight: 56.5 kg (124 lb 8.2 oz)  Lumbar spine pain, dermatomal pain pattern, right L4-L5 Spinal cord stimulator IPG present 5 out of 5 strength bilateral lower extremity: Plantar flexion, dorsiflexion, knee flexion, knee extension.   Assessment   Diagnosis   1. Thoracic spine pain   2. Failed back surgical syndrome   3. Postlaminectomy syndrome, lumbar region   4. Chronic radicular lumbar pain   5. Chronic pain syndrome   6. Neuropathic pain         Plan of Care    [facility] has a  current medication list which includes the following long-term medication(s): cetirizine, escitalopram, ferrous sulfate, fluticasone, gabapentin, hydrochlorothiazide, imipramine, and omeprazole.  Pharmacotherapy (Medications Ordered): Meds ordered this encounter  Medications   meloxicam (MOBIC) 15 MG tablet    Sig: Take 1 tablet (15 mg total) by mouth daily.    Dispense:  30 tablet    Refill:  0   gabapentin (NEURONTIN) 400 MG capsule    Sig: Take 1 capsule (400 mg total) by mouth 2 (two) times daily.    Dispense:  60 capsule    Refill:  2   Consider caudal ESI if symptoms are not better Consider reprogramming/optimizing SCS settings Med[employer]onic spinal cord stimulator Patient instructed to refrain from NSAIDs while she is on Mobic.  Follow-up plan:   Return in about 2 months (around 05/24/2022) for Medication Management, in person.    Recent Visits No visits were found meeting these conditions. Showing recent visits within past 90 days and meeting all other requirements Today's Visits Date Type Provider Dept  03/17/22 Office Visit Gillis Santa, MD [facility]  Showing today's visits and meeting all other requirements Future Appointments No visits were found meeting these conditions. Showing future appointments within next 90 days and meeting all other requirements  I discussed the assessment and treatment plan with the patient. The  patient was provided an opportunity to ask questions and all were answered. The patient agreed with the plan and demonstrated an understanding of the instructions.  Patient advised to call back or seek an in-person evaluation if the symptoms or condition worsens.  Duration of encounter: 30 minutes.  Total time on encounter, as per AMA guidelines included both the face-to-face and non-face-to-face time personally spent by the physician and/or other qualified health care professional(s) on the day of the encounter (includes time in activities  that require the physician or other qualified health care professional and does not include time in activities normally performed by clinical staff). Physician's time may include the following activities when performed: preparing to see the patient (eg, review of tests, pre-charting review of records) obtaining and/or reviewing separately obtained history performing a medically appropriate examination and/or evaluation counseling and educating the patient/family/caregiver ordering medications, tests, or procedures referring and communicating with other health care professionals (when not separately reported) documenting clinical information in the electronic or other health record independently interpreting results (not separately reported) and communicating results to the patient/ family/caregiver care coordination (not separately reported)  Note by: Gillis Santa, MD Date: 03/17/2022; Time: 10:26 AM

## 2022-04-15 ENCOUNTER — Other Ambulatory Visit: Payer: Self-pay | Admitting: Student in an Organized Health Care Education/Training Program

## 2022-04-15 ENCOUNTER — Encounter: Payer: Self-pay | Admitting: Student in an Organized Health Care Education/Training Program

## 2022-04-20 ENCOUNTER — Other Ambulatory Visit: Payer: Self-pay

## 2022-04-20 MED ORDER — MELOXICAM 15 MG PO TABS: 15.0000 mg | ORAL_TABLET | Freq: Every day | ORAL | 2 refills | Status: DC | PRN

## 2022-04-24 ENCOUNTER — Other Ambulatory Visit: Payer: Self-pay | Admitting: Family Medicine

## 2022-04-24 DIAGNOSIS — E041 Nontoxic single thyroid nodule: Secondary | ICD-10-CM

## 2022-04-24 DIAGNOSIS — I10 Essential (primary) hypertension: Secondary | ICD-10-CM

## 2022-04-24 DIAGNOSIS — R7989 Other specified abnormal findings of blood chemistry: Secondary | ICD-10-CM

## 2022-04-25 ENCOUNTER — Other Ambulatory Visit (INDEPENDENT_AMBULATORY_CARE_PROVIDER_SITE_OTHER): Payer: BC Managed Care – PPO

## 2022-04-25 DIAGNOSIS — E041 Nontoxic single thyroid nodule: Secondary | ICD-10-CM | POA: Diagnosis not present

## 2022-04-25 DIAGNOSIS — I10 Essential (primary) hypertension: Secondary | ICD-10-CM

## 2022-04-25 DIAGNOSIS — R7989 Other specified abnormal findings of blood chemistry: Secondary | ICD-10-CM | POA: Diagnosis not present

## 2022-04-25 LAB — TSH: TSH: 2.25 u[IU]/mL (ref 0.35–5.50)

## 2022-04-26 LAB — LIPID PANEL
Cholesterol: 209 mg/dL — ABNORMAL HIGH (ref 0–200)
HDL: 80 mg/dL (ref >39.00)
LDL Cholesterol: 113 mg/dL — ABNORMAL HIGH (ref 0–99)
NonHDL: 128.52
Total CHOL/HDL Ratio: 3
Triglycerides: 79 mg/dL (ref 0.0–149.0)
VLDL: 15.8 mg/dL (ref 0.0–40.0)

## 2022-04-26 LAB — BASIC METABOLIC PANEL
BUN: 7 mg/dL (ref 6–23)
CO2: 28 mEq/L (ref 19–32)
Calcium: 9.3 mg/dL (ref 8.4–10.5)
Chloride: 93 mEq/L — ABNORMAL LOW (ref 96–112)
Creatinine, Ser: 0.76 mg/dL (ref 0.40–1.20)
GFR: 84.57 mL/min (ref >60.00)
Glucose, Bld: 96 mg/dL (ref 70–99)
Potassium: 4.4 mEq/L (ref 3.5–5.1)
Sodium: 131 mEq/L — ABNORMAL LOW (ref 135–145)

## 2022-05-02 ENCOUNTER — Encounter: Payer: Self-pay | Admitting: Family Medicine

## 2022-05-02 ENCOUNTER — Ambulatory Visit (INDEPENDENT_AMBULATORY_CARE_PROVIDER_SITE_OTHER): Payer: BC Managed Care – PPO | Admitting: Family Medicine

## 2022-05-02 VITALS — BP 126/80 | HR 67 | Temp 97.6°F | Ht 64.0 in | Wt 145.2 lb

## 2022-05-02 DIAGNOSIS — Z23 Encounter for immunization: Secondary | ICD-10-CM | POA: Diagnosis not present

## 2022-05-02 DIAGNOSIS — R7989 Other specified abnormal findings of blood chemistry: Secondary | ICD-10-CM

## 2022-05-02 DIAGNOSIS — Z0000 Encounter for general adult medical examination without abnormal findings: Secondary | ICD-10-CM

## 2022-05-02 DIAGNOSIS — M159 Polyosteoarthritis, unspecified: Secondary | ICD-10-CM

## 2022-05-02 DIAGNOSIS — F418 Other specified anxiety disorders: Secondary | ICD-10-CM | POA: Diagnosis not present

## 2022-05-02 DIAGNOSIS — Z87891 Personal history of nicotine dependence: Secondary | ICD-10-CM

## 2022-05-02 DIAGNOSIS — R208 Other disturbances of skin sensation: Secondary | ICD-10-CM | POA: Diagnosis not present

## 2022-05-02 DIAGNOSIS — M545 Low back pain, unspecified: Secondary | ICD-10-CM | POA: Diagnosis not present

## 2022-05-02 DIAGNOSIS — I10 Essential (primary) hypertension: Secondary | ICD-10-CM

## 2022-05-02 DIAGNOSIS — G373 Acute transverse myelitis in demyelinating disease of central nervous system: Secondary | ICD-10-CM

## 2022-05-02 DIAGNOSIS — K589 Irritable bowel syndrome without diarrhea: Secondary | ICD-10-CM

## 2022-05-02 DIAGNOSIS — G8929 Other chronic pain: Secondary | ICD-10-CM

## 2022-05-02 DIAGNOSIS — K219 Gastro-esophageal reflux disease without esophagitis: Secondary | ICD-10-CM

## 2022-05-02 DIAGNOSIS — M961 Postlaminectomy syndrome, not elsewhere classified: Secondary | ICD-10-CM

## 2022-05-02 DIAGNOSIS — Z9689 Presence of other specified functional implants: Secondary | ICD-10-CM | POA: Insufficient documentation

## 2022-05-02 DIAGNOSIS — G894 Chronic pain syndrome: Secondary | ICD-10-CM

## 2022-05-02 MED ORDER — HYDROXYZINE HCL 25 MG PO TABS: ORAL_TABLET | ORAL | 0 refills | Status: DC

## 2022-05-02 MED ORDER — ESCITALOPRAM OXALATE 10 MG PO TABS: 10.0000 mg | ORAL_TABLET | Freq: Every day | ORAL | 3 refills | Status: DC

## 2022-05-02 MED ORDER — OMEPRAZOLE 20 MG PO CPDR: DELAYED_RELEASE_CAPSULE | ORAL | 3 refills | Status: DC

## 2022-05-02 NOTE — Patient Instructions (Addendum)
Flu shot today  Mood questionnaire today.  We will request latest mammogram from Dr Gwynne Edinger office in [place].  Consider trial off impiramine - touch base with Dr Felecia Shelling about this.  Sodium levels were low - try off hydrochlorothiazide. Let us know if blood pressures start rising.  Good to see you today  Return in 6 months for follow up visit   Health Maintenance for Postmenopausal Women Menopause is a normal process in which your ability to get pregnant comes to an end. This process happens slowly over many months or years, usually between the ages of 74 and 69. Menopause is complete when you have missed your menstrual period for 12 months. It is important to talk with your health care provider about some of the most common conditions that affect women after menopause (postmenopausal women). These include heart disease, cancer, and bone loss (osteoporosis). Adopting a healthy lifestyle and getting preventive care can help to promote your health and wellness. The actions you take can also lower your chances of developing some of these common conditions. What are the signs and symptoms of menopause? During menopause, you may have the following symptoms: Hot flashes. These can be moderate or severe. Night sweats. Decrease in sex drive. Mood swings. Headaches. Tiredness (fatigue). Irritability. Memory problems. Problems falling asleep or staying asleep. Talk with your health care provider about treatment options for your symptoms. Do I need hormone replacement therapy? Hormone replacement therapy is effective in treating symptoms that are caused by menopause, such as hot flashes and night sweats. Hormone replacement carries certain risks, especially as you become older. If you are thinking about using estrogen or estrogen with progestin, discuss the benefits and risks with your health care provider. How can I reduce my risk for heart disease and stroke? The risk of heart disease,  heart attack, and stroke increases as you age. One of the causes may be a change in the body's hormones during menopause. This can affect how your body uses dietary fats, triglycerides, and cholesterol. Heart attack and stroke are medical emergencies. There are many things that you can do to help prevent heart disease and stroke. Watch your blood pressure High blood pressure causes heart disease and increases the risk of stroke. This is more likely to develop in people who have high blood pressure readings or are overweight. Have your blood pressure checked: Every 3-5 years if you are 79-48 years of age. Every year if you are 74 years old or older. Eat a healthy diet  Eat a diet that includes plenty of vegetables, fruits, low-fat dairy products, and lean protein. Do not eat a lot of foods that are high in solid fats, added sugars, or sodium. Get regular exercise Get regular exercise. This is one of the most important things you can do for your health. Most adults should: Try to exercise for at least 150 minutes each week. The exercise should increase your heart rate and make you sweat (moderate-intensity exercise). Try to do strengthening exercises at least twice each week. Do these in addition to the moderate-intensity exercise. Spend less time sitting. Even light physical activity can be beneficial. Other tips Work with your health care provider to achieve or maintain a healthy weight. Do not use any products that contain nicotine or tobacco. These products include cigarettes, chewing tobacco, and vaping devices, such as e-cigarettes. If you need help quitting, ask your health care provider. Know your numbers. Ask your health care provider to check your cholesterol and your  blood sugar (glucose). Continue to have your blood tested as directed by your health care provider. Do I need screening for cancer? Depending on your health history and family history, you may need to have cancer screenings  at different stages of your life. This may include screening for: Breast cancer. Cervical cancer. Lung cancer. Colorectal cancer. What is my risk for osteoporosis? After menopause, you may be at increased risk for osteoporosis. Osteoporosis is a condition in which bone destruction happens more quickly than new bone creation. To help prevent osteoporosis or the bone fractures that can happen because of osteoporosis, you may take the following actions: If you are 43-22 years old, get at least 1,000 mg of calcium and at least 600 international units (IU) of vitamin D per day. If you are older than age 14 but younger than age 56, get at least 1,200 mg of calcium and at least 600 international units (IU) of vitamin D per day. If you are older than age 53, get at least 1,200 mg of calcium and at least 800 international units (IU) of vitamin D per day. Smoking and drinking excessive alcohol increase the risk of osteoporosis. Eat foods that are rich in calcium and vitamin D, and do weight-bearing exercises several times each week as directed by your health care provider. How does menopause affect my mental health? Depression may occur at any age, but it is more common as you become older. Common symptoms of depression include: Feeling depressed. Changes in sleep patterns. Changes in appetite or eating patterns. Feeling an overall lack of motivation or enjoyment of activities that you previously enjoyed. Frequent crying spells. Talk with your health care provider if you think that you are experiencing any of these symptoms. General instructions See your health care provider for regular wellness exams and vaccines. This may include: Scheduling regular health, dental, and eye exams. Getting and maintaining your vaccines. These include: Influenza vaccine. Get this vaccine each year before the flu season begins. Pneumonia vaccine. Shingles vaccine. Tetanus, diphtheria, and pertussis (Tdap) booster  vaccine. Your health care provider may also recommend other immunizations. Tell your health care provider if you have ever been abused or do not feel safe at home. Summary Menopause is a normal process in which your ability to get pregnant comes to an end. This condition causes hot flashes, night sweats, decreased interest in sex, mood swings, headaches, or lack of sleep. Treatment for this condition may include hormone replacement therapy. Take actions to keep yourself healthy, including exercising regularly, eating a healthy diet, watching your weight, and checking your blood pressure and blood sugar levels. Get screened for cancer and depression. Make sure that you are up to date with all your vaccines. This information is not intended to replace advice given to you by your health care provider. Make sure you discuss any questions you have with your health care provider. Document Revised: 10/12/2020 Document Reviewed: 10/12/2020 Elsevier Patient Education  [employer].

## 2022-05-02 NOTE — Assessment & Plan Note (Signed)
Congratulated on ongoing cessation - quit 2000.

## 2022-05-02 NOTE — Progress Notes (Signed)
Patient ID: Elizabeth Long, female    DOB: 1960/12/02, 61 y.o.   MRN: [id number]  This visit was conducted in person.  BP 126/80   Pulse 67   Temp 97.6 F (36.4 C) (Skin)   Ht '5\' 4"'$  (1.626 m)   Wt 145 lb 4 oz (65.9 kg)   SpO2 97%   BMI 24.93 kg/m    CC: CPE Subjective:   HPI: Elizabeth Long is a 61 y.o. female presenting on 05/02/2022 for Annual Exam   1d h/o ST associated with PNDrainage. This morning woke up lightheaded. No cough, no fever. Some sick family members.   MS vs transverse myelitis followed by Dr Felecia Shelling, last seen 11/2021, f/u planned 05/2022. Noted ongoing bilateral tinnitus without hearing loss - suggested stopping imipramine at that time - she didn't stop this. Plan is rpt brain MRI 2024 to monitor for progression.   Mood - stable period on lexapro '10mg'$  daily - asks about increasing lexapro to '15mg'$  dialy. Significant anxiety - she has even cried at meetings.   Preventative: Colonoscopy 09/2016 - 71m TA, rpt 3 yrs (Nandigam)  Colonoscopy 10/2019 - 168mHP, int hem, rpt 5 yrs (Nandigam)  Well woman with OBGYN Dr WeStann Mainlandpcoming appt 06/2022. S/p hysterectomy (endometriosis). Ovaries remain.  Breast cancer screening - mammogram yearly with OBGYN Lung cancer screening - not eligible  Flu shot yearly  COOptima/2021 x2, booster x2 06/2020, 12/2020 Penumonia shot - not due Tdap 02/2016  Shingrix - 04/2020, 10/2020 Advanced directive - scanned 10/2021. HCPOA is DoLoistine ChanceDoes not want life prolonging measures if terminal condition.   Seat belt use discussed  Sunscreen use discussed. No changing moles on skin.  Ex smoker - quit 04/1999. Prior 2 ppd. 20+ PY hx.  Alcohol use - none - sober since 1994  Dentist yearly  Eye exam yearly  Bowels - possible constipation from imipramine - managed with stool softener  Bladder - no incontinence    Daily caffeine 3-4 cups Lives alone Occ: [employer] cabin [employer]t [employer]: HS Activity: goes to  gym regularly 3x/wk  Diet: good water, poor fruits/vegetables, sweets     Relevant past medical, surgical, family and social history reviewed and updated as indicated. Interim medical history since our last visit reviewed. Allergies and medications reviewed and updated. Outpatient Medications Prior to Visit  Medication Sig Dispense Refill   acetaminophen-codeine (TYLENOL #3) 300-30 MG tablet Take 1 tablet by mouth 2 (two) times daily as needed for moderate pain. 20 tablet 0   cetirizine (ZYRTEC) 10 MG tablet Take 10 mg by mouth daily as needed for allergies.     COLLAGEN PO Take 1 Scoop by mouth daily.     diclofenac Sodium (VOLTAREN) 1 % GEL Apply 2 g topically daily as needed (pain).     ferrous sulfate 324 (65 Fe) MG TBEC Take 1 tablet (325 mg total) by mouth daily.     fluticasone (FLONASE) 50 MCG/ACT nasal spray Place 2 sprays into both nostrils daily as needed for allergies or rhinitis.     gabapentin (NEURONTIN) 400 MG capsule Take 1 capsule (400 mg total) by mouth 2 (two) times daily. 60 capsule 2   imipramine (TOFRANIL) 25 MG tablet TAKE 1 TABLET BY MOUTH EVERYDAY AT BEDTIME 90 tablet 1   meloxicam (MOBIC) 15 MG tablet Take 1 tablet (15 mg total) by mouth daily as needed for pain. 30 tablet 2   methocarbamol (ROBAXIN) 500 MG tablet  Take 1 tablet (500 mg total) by mouth every 8 (eight) hours as needed for muscle spasms. 30 tablet 0   Multiple Vitamins-Minerals (CENTRUM SILVER 50+WOMEN) TABS Take 1 tablet by mouth daily.     polyvinyl alcohol (LIQUIFILM TEARS) 1.4 % ophthalmic solution Place 1 drop into both eyes as needed for dry eyes.     escitalopram (LEXAPRO) 10 MG tablet Take 1 tablet (10 mg total) by mouth daily. 90 tablet 1   hydrochlorothiazide (MICROZIDE) 12.5 MG capsule TAKE 1 CAPSULE BY MOUTH EVERY DAY 90 capsule 3   hydrOXYzine (ATARAX) 25 MG tablet TAKE 1 TABLET BY MOUTH EVERYDAY AT BEDTIME 90 tablet 0   omeprazole (PRILOSEC) 20 MG capsule TAKE 1 CAPSULE BY MOUTH EVERY DAY  90 capsule 3   No facility-administered medications prior to visit.     Per HPI unless specifically indicated in ROS section below Review of Systems  Constitutional:  Negative for activity change, appetite change, chills, fatigue, fever and unexpected weight change.  HENT:  Negative for hearing loss.   Eyes:  Negative for visual disturbance.  Respiratory:  Negative for cough, chest tightness, shortness of breath and wheezing.   Cardiovascular:  Negative for chest pain, palpitations and leg swelling.  Gastrointestinal:  Positive for constipation. Negative for abdominal distention, abdominal pain, blood in stool, diarrhea, nausea and vomiting.  Genitourinary:  Negative for difficulty urinating and hematuria.  Musculoskeletal:  Negative for arthralgias, myalgias and neck pain.  Skin:  Negative for rash.  Neurological:  Negative for dizziness, seizures, syncope and headaches.  Hematological:  Negative for adenopathy. Does not bruise/bleed easily.  Psychiatric/Behavioral:  Negative for dysphoric mood. The patient is nervous/anxious.     Objective:  BP 126/80   Pulse 67   Temp 97.6 F (36.4 C) (Skin)   Ht '5\' 4"'$  (1.626 m)   Wt 145 lb 4 oz (65.9 kg)   SpO2 97%   BMI 24.93 kg/m   Wt Readings from Last 3 Encounters:  05/02/22 145 lb 4 oz (65.9 kg)  03/17/22 138 lb (62.6 kg)  12/15/21 145 lb 4 oz (65.9 kg)      Physical Exam Vitals and nursing note reviewed.  Constitutional:      Appearance: Normal appearance. She is not ill-appearing.  HENT:     Head: Normocephalic and atraumatic.     Right Ear: Tympanic membrane, ear canal and external ear normal. There is no impacted cerumen.     Left Ear: Tympanic membrane, ear canal and external ear normal. There is no impacted cerumen.     Nose: Nose normal.     Mouth/Throat:     Mouth: Mucous membranes are moist.     Pharynx: Oropharynx is clear. No oropharyngeal exudate or posterior oropharyngeal erythema.  Eyes:     General:         Right eye: No discharge.        Left eye: No discharge.     Extraocular Movements: Extraocular movements intact.     Conjunctiva/sclera: Conjunctivae normal.     Pupils: Pupils are equal, round, and reactive to light.  Neck:     Thyroid: No thyroid mass or thyromegaly.  Cardiovascular:     Rate and Rhythm: Normal rate and regular rhythm.     Pulses: Normal pulses.     Heart sounds: Normal heart sounds. No murmur heard. Pulmonary:     Effort: Pulmonary effort is normal. No respiratory distress.     Breath sounds: Normal breath sounds. No wheezing, rhonchi  or rales.  Abdominal:     General: Bowel sounds are normal. There is no distension.     Palpations: Abdomen is soft. There is no mass.     Tenderness: There is no abdominal tenderness. There is no guarding or rebound.     Hernia: No hernia is present.  Musculoskeletal:     Cervical back: Normal range of motion and neck supple. No rigidity.     Right lower leg: No edema.     Left lower leg: No edema.  Lymphadenopathy:     Cervical: No cervical adenopathy.  Skin:    General: Skin is warm and dry.     Findings: No rash.  Neurological:     General: No focal deficit present.     Mental Status: She is alert. Mental status is at baseline.  Psychiatric:        Mood and Affect: Mood normal.        Behavior: Behavior normal.       Results for orders placed or performed in visit on 47/82/95  Basic metabolic panel  Result Value Ref Range   Sodium 131 (L) 135 - 145 mEq/L   Potassium 4.4 3.5 - 5.1 mEq/L   Chloride 93 (L) 96 - 112 mEq/L   CO2 28 19 - 32 mEq/L   Glucose, Bld 96 70 - 99 mg/dL   BUN 7 6 - 23 mg/dL   Creatinine, Ser 0.76 0.40 - 1.20 mg/dL   GFR 84.57 >60.00 mL/min   Calcium 9.3 8.4 - 10.5 mg/dL  Lipid panel  Result Value Ref Range   Cholesterol 209 (H) 0 - 200 mg/dL   Triglycerides 79.0 0.0 - 149.0 mg/dL   HDL 80.00 >39.00 mg/dL   VLDL 15.8 0.0 - 40.0 mg/dL   LDL Cholesterol 113 (H) 0 - 99 mg/dL   Total CHOL/HDL  Ratio 3    NonHDL 128.52   TSH  Result Value Ref Range   TSH 2.25 0.35 - 5.50 uIU/mL      05/02/2022    3:13 PM 05/02/2022    2:21 PM 03/17/2022   10:06 AM 12/02/2021    2:07 PM 04/26/2021    3:30 PM  Depression screen PHQ 2/9  Decreased Interest 1 0 0 0 0  Down, Depressed, Hopeless 0 0 0 0 0  PHQ - 2 Score 1 0 0 0 0  Altered sleeping 1    1  Tired, decreased energy 1    0  Change in appetite 3    0  Feeling bad or failure about yourself  0    0  Trouble concentrating 1    0  Moving slowly or fidgety/restless 0    0  Suicidal thoughts 0    0  PHQ-9 Score 7    1  Difficult doing work/chores Not difficult at all           05/02/2022    3:14 PM 04/26/2021    3:30 PM 04/15/2020    4:24 PM 05/23/2018    3:16 PM  GAD 7 : Generalized Anxiety Score  Nervous, Anxious, on Edge '1 1 1 '$ 0  Control/stop worrying 0 0 0 0  Worry too much - different things 0 1 0 0  Trouble relaxing 1 0 0 0  Restless 0 0 0 0  Easily annoyed or irritable '1 1 1 '$ 0  Afraid - awful might happen 0 0 0 0  Total GAD 7 Score '3 3 2 '$ 0  Anxiety  Difficulty Not difficult at all      Assessment & Plan:   Problem List Items Addressed This Visit       Unprioritized   Health maintenance examination - Primary (Chronic)    Preventative protocols reviewed and updated unless pt declined. Discussed healthy diet and lifestyle.       Anxiety associated with depression    Chronic, work stress related anxiety, overall stable period on lexapro '10mg'$  daily.  She asks about increasing dose. Hesitant to increase SSRI with recent hyponatremia noted - see below.  Update PHQ9/GAD7.      Relevant Medications   escitalopram (LEXAPRO) 10 MG tablet   hydrOXYzine (ATARAX) 25 MG tablet   GERD (gastroesophageal reflux disease)    Chronic, stable on low dose omeprazole - continue this.       Relevant Medications   omeprazole (PRILOSEC) 20 MG capsule   Osteoarthritis   Irritable bowel syndrome (IBS)    Imipramine had helped  IBS-D.       Relevant Medications   omeprazole (PRILOSEC) 20 MG capsule   Essential hypertension    Chronic, stable on hctz - will stop with recent hyponatremia, advised monitor BP and let us know if runs >140/90 to consider additional medication.       Chronic lower back pain   Relevant Medications   escitalopram (LEXAPRO) 10 MG tablet   High serum high density lipoprotein (HDL)    HLD predominantly driven by high HDL.  The 10-year ASCVD risk score (Arnett DK, et al., 2019) is: 3.9%   Values used to calculate the score:     Age: 1 years     Sex: Female     Is Non-Hispanic African American: No     Diabetic: No     Tobacco smoker: No     Systolic Blood Pressure: 660 mmHg     Is BP treated: Yes     HDL Cholesterol: 80 mg/dL     Total Cholesterol: 209 mg/dL       Ex-smoker    Congratulated on ongoing cessation - quit 2000.       Transverse myelitis [facility])    Appreciate neurology care.       Burning sensation of feet    Continues imipramine and gabapentin. Discussed possibly stopping this med to see if constipation and tinnitus improve.       Postlaminectomy syndrome, lumbar region   Chronic pain syndrome   Relevant Medications   escitalopram (LEXAPRO) 10 MG tablet   Spinal cord stimulator status   Other Visit Diagnoses     Need for influenza vaccination       Relevant Orders   Flu Vaccine QUAD 6+ mos PF IM (Fluarix Quad PF) (Completed)        Meds ordered this encounter  Medications   escitalopram (LEXAPRO) 10 MG tablet    Sig: Take 1 tablet (10 mg total) by mouth daily.    Dispense:  90 tablet    Refill:  3   hydrOXYzine (ATARAX) 25 MG tablet    Sig: TAKE 1 TABLET BY MOUTH EVERYDAY AT BEDTIME    Dispense:  90 tablet    Refill:  0   omeprazole (PRILOSEC) 20 MG capsule    Sig: TAKE 1 CAPSULE BY MOUTH EVERY DAY    Dispense:  90 capsule    Refill:  3   Orders Placed This Encounter  Procedures   Flu Vaccine QUAD 6+ mos PF IM (Fluarix Quad PF)  Patient instructions: Flu shot today  Mood questionnaire today.  We will request latest mammogram from Dr Gwynne Edinger office in [place].  Consider trial off impiramine - touch base with Dr Felecia Shelling about this.  Sodium levels were low - try off hydrochlorothiazide. Let us know if blood pressures start rising.  Good to see you today  Return in 6 months for follow up visit   Follow up plan: Return in about 6 months (around 10/31/2022), or if symptoms worsen or fail to improve, for follow up visit.  Ria Bush, MD

## 2022-05-02 NOTE — Assessment & Plan Note (Signed)
Chronic, stable on low dose omeprazole - continue this.

## 2022-05-02 NOTE — Assessment & Plan Note (Signed)
Appreciate neurology care.

## 2022-05-02 NOTE — Assessment & Plan Note (Addendum)
HLD predominantly driven by high HDL.  The 10-year ASCVD risk score (Arnett DK, et al., 2019) is: 3.9%   Values used to calculate the score:     Age: 61 years     Sex: Female     Is Non-Hispanic African American: No     Diabetic: No     Tobacco smoker: No     Systolic Blood Pressure: 299 mmHg     Is BP treated: Yes     HDL Cholesterol: 80 mg/dL     Total Cholesterol: 209 mg/dL

## 2022-05-02 NOTE — Assessment & Plan Note (Addendum)
Chronic, work stress related anxiety, overall stable period on lexapro '10mg'$  daily.  She asks about increasing dose. Hesitant to increase SSRI with recent hyponatremia noted - see below.  Update PHQ9/GAD7.

## 2022-05-02 NOTE — Assessment & Plan Note (Signed)
Preventative protocols reviewed and updated unless pt declined. Discussed healthy diet and lifestyle.  

## 2022-05-02 NOTE — Assessment & Plan Note (Signed)
Imipramine had helped IBS-D.

## 2022-05-02 NOTE — Assessment & Plan Note (Signed)
Chronic, stable on hctz - will stop with recent hyponatremia, advised monitor BP and let us know if runs >140/90 to consider additional medication.

## 2022-05-02 NOTE — Assessment & Plan Note (Addendum)
Continues imipramine and gabapentin. Discussed possibly stopping this med to see if constipation and tinnitus improve.

## 2022-05-17 ENCOUNTER — Encounter: Payer: Self-pay | Admitting: Neurology

## 2022-05-17 ENCOUNTER — Ambulatory Visit: Payer: BC Managed Care – PPO | Admitting: Neurology

## 2022-05-17 VITALS — BP 150/93 | HR 77 | Ht 64.0 in | Wt 147.5 lb

## 2022-05-17 DIAGNOSIS — R9082 White matter disease, unspecified: Secondary | ICD-10-CM | POA: Diagnosis not present

## 2022-05-17 DIAGNOSIS — G373 Acute transverse myelitis in demyelinating disease of central nervous system: Secondary | ICD-10-CM

## 2022-05-17 DIAGNOSIS — G894 Chronic pain syndrome: Secondary | ICD-10-CM | POA: Diagnosis not present

## 2022-05-17 DIAGNOSIS — R208 Other disturbances of skin sensation: Secondary | ICD-10-CM | POA: Diagnosis not present

## 2022-05-17 DIAGNOSIS — R269 Unspecified abnormalities of gait and mobility: Secondary | ICD-10-CM

## 2022-05-17 MED ORDER — LAMOTRIGINE 25 MG PO TABS: ORAL_TABLET | ORAL | 5 refills | Status: DC

## 2022-05-17 MED ORDER — IMIPRAMINE HCL 25 MG PO TABS: ORAL_TABLET | ORAL | 3 refills | Status: DC

## 2022-05-17 NOTE — Patient Instructions (Signed)
The pharmacy has the prescription of lamotrigine 25 mg. Please take it as follows: For 1 week take 1 pill once a day. The second week, take 1 pill twice a day. The third week, take 1 pill three times a day. Then take 2 pills twice a day.   Most people tolerate lamotrigine very well. Some will get a rash.  If you do get a significant rash stop the medicine immediately and let us know.

## 2022-05-17 NOTE — Progress Notes (Signed)
[facility]  PATIENT: Elizabeth Long DOB: 08-Nov-1960  REFERRING DOCTOR OR PCP: Ria Bush MD SOURCE: Patient, notes from primary care, imaging and lab reports, MRI images of the cervical and lumbar spine personally reviewed  _________________________________   HISTORICAL  CHIEF COMPLAINT:  Chief Complaint  Patient presents with   Follow-up    Pt in room #11 and alone. Pt here today for f/u on Transverse myelitis.    HISTORY OF PRESENT ILLNESS:  Elizabeth Long is a 61 y.o. woman with numbness and gait issues and T2 hyperintense foci i spinal cord and brain.  Update 05/17/2022 She is noting more spasms in her lower legs/feet and sometimes thigh.   She notes more discomfort in the legs.    She has numbness in her feet since 2019or before with dysesthesias feeling like sandpaper.  She also has a different dysesthetic    Imipramine had helped some and also helped sleep some.   Gabapentin is tolerated but less effective.   Arms are usually fine.      She still has tingling in her ears.    She is noting more issues with memory and fatigue  She is on gabapentin 400 mg qevening and qHS (makes her sleepy)..    A  spinal cord stimulator (Dr. Lacinda Axon [facility] at [facility] 2022) has helped the neuropathic type pain.   In preparation for the SCS , she had an MRI of the T-spine in 2021 and it showed a focus at T9 (sje has not had T-spie in past to compare) and another in the conus at L1 (old on prior lumbar MRI).   She [place] has a focus at [facility] not adjacent to DJD.     Currently, gait is fine but balance is mildly off.   She denies weakness.   She has numbness in both legs.   She has incomplete bladder emptying.   She denies any visual disturbances.   She has fatigue.   She has insomnia and takes gabapentin (300 mg qHS) and lorazepam 2 mg   She notes depression and has work stress.        She is working doing Medical sales representative work.   She is noting that she is more forgetful than before  and is no new difficulties with her focus and attention.  She has headaches and feels the imipramine has not helped them much and causes constipation and increases appetite    Imipramine helped the dysesthesias.     History of neurologic symptoms and subsequent studies: Around 2008, she began to experience symptoms in her neck and arm.  These would fluctuate.  In 2011, an MRI of the cervical spine showed a focus adjacent to C2-C3 towards the left (not present in 2006).  At the time, she actually had more symptoms in the right arm.  At some point she had surgery at C5-C6 ([facility]).  She did well and have less symptoms for many years but then began to experience more numbness in the arms and legs.  In 2021, she was noted on MRI to have a T2 hyperintense focus adjacent to L1 and may be a second 1 adjacent to T12 in the spinal cord.  She also had degenerative changes.  CSF 08/21/2019 did not show oligoclonal bands.  Repeat imaging in 2020 one of the cervical spine just showed he same C2-C3 focus, diffusion and progressive degenerative changes C4-C5.  MRI of the brain shows only a few T2 hyperintense foci.  I reviewed imaging  from 2008, 2011, and 2021.  The focus at C2-C3 appears to have developed between the 2008 and 2011 MRI of the cervical spine.  The focus in the conus medullaris appears to have occurred after the 2013 MRI and before the 2021 MRI.  There is only 1 MRI of the brain.   Studies:  Imaging:  MRI of the cervical spine 03/18/2010.  The cervical spine shows a left lateral focus at C2-C3 that could be consistent with a demyelinating plaque.  That focus was not present in 2008  MRI of the lumbar spine 07/18/2019  shows a focus adjacent to T12 to the right and possibly 1 within the conus adjacent to L1 to the anterolaterally to the left.   At [facility] disc protrusion could affect left L3 nerve root and at L4L5 protrusion could affect left L5 nerve root.    MRI of the brain and cervical spine 08/13/2019  shows a T2 hyperintense focus within the spinal cord adjacent to C2-C3 which had been seen previously.  There are 2-3 hemispheric small foci, including one juxtacortical one and a focus within the pons.  She has ACDF at C5-C6.  At C4-C5 there is retrolisthesis and other degenerative changes causing foraminal narrowing but no nerve root compression.  This has progressed compared to the previous MRI from 2011.   MRI brain 10/14/2020 showed  Several T2/FLAIR hyperintense foci in the white matter of the hemispheres and in the right pons.  They do not enhance or appear to be acute.  Compared to the MRI from 08/13/2019, there are no new lesions.  Though the overall pattern is nonspecific, the one focus in the periventricular white matter and the other in the juxtacortical white matter on the left could be consistent with chronic demyelinating plaque associated with MS.  They could also represent age-related chronic microvascular ischemic change.  MRI cervical spine 10/14/2020 showed   T2 hyperintense focus within the spinal cord laterally to the left.  The location could be consistent with demyelination from multiple sclerosis.  It does not enhance and is unchanged compared to the 08/13/2019 MRI.  Another T2 hyperintense focus is noted within the pons, also unchanged.      Remote fusion at C5-C6    At C3-C4, there is 2 mm anterolisthesis and other degenerative changes causing moderately severe right and moderate left foraminal narrowing but no spinal stenosis.  There is potential for right C4 nerve root compression.  This level has slightly progressed compared to the 2021 MRI.     At C4-C5, there are stable degenerative changes including 3 mm retrolisthesis.  There is moderate right foraminal narrowing but no nerve root compression.  MRI thoracic spine 03/25/2021 showed a focus at T9   DATA: Lumbar puncture 08/21/2019 did not show any bands in the CSF not present in the serum though there were 3 identical gamma  restricted bands in the CSF and serum.  IgG index was normal at 0.53.    REVIEW OF SYSTEMS: Constitutional: No fevers, chills, sweats, or change in appetite.   Insomnia and fatigue Eyes: No visual changes, double vision, eye pain Ear, nose and throat: No hearing loss, ear pain, nasal congestion, sore throat Cardiovascular: No chest pain, palpitations Respiratory:  No shortness of breath at rest or with exertion.   No wheezes GastrointestinaI: No nausea, vomiting, diarrhea, abdominal pain, fecal incontinence Genitourinary:  No dysuria, urinary retention or frequency.  No nocturia. Musculoskeletal:  Notes neck pain, back pain Integumentary: No rash, pruritus, skin  lesions Neurological: as above Psychiatric: Notes depression and anxiety Endocrine: No palpitations, diaphoresis, change in appetite, change in weigh or increased thirst Hematologic/Lymphatic:  No anemia, purpura, petechiae. Allergic/Immunologic: No itchy/runny eyes, nasal congestion, recent allergic reactions, rashes  ALLERGIES: Allergies  Allergen Reactions   Cefotan [Cefotetan Disodium] Swelling   Elemental Sulfur Rash    Rash all over    HOME MEDICATIONS:  Current Outpatient Medications:    acetaminophen-codeine (TYLENOL #3) 300-30 MG tablet, Take 1 tablet by mouth 2 (two) times daily as needed for moderate pain., Disp: 20 tablet, Rfl: 0   cetirizine (ZYRTEC) 10 MG tablet, Take 10 mg by mouth daily as needed for allergies., Disp: , Rfl:    diclofenac Sodium (VOLTAREN) 1 % GEL, Apply 2 g topically daily as needed (pain)., Disp: , Rfl:    escitalopram (LEXAPRO) 10 MG tablet, Take 1 tablet (10 mg total) by mouth daily., Disp: 90 tablet, Rfl: 3   ferrous sulfate 324 (65 Fe) MG TBEC, Take 1 tablet (325 mg total) by mouth daily., Disp: , Rfl:    fluticasone (FLONASE) 50 MCG/ACT nasal spray, Place 2 sprays into both nostrils daily as needed for allergies or rhinitis., Disp: , Rfl:    gabapentin (NEURONTIN) 400 MG capsule,  Take 1 capsule (400 mg total) by mouth 2 (two) times daily., Disp: 60 capsule, Rfl: 2   hydrOXYzine (ATARAX) 25 MG tablet, TAKE 1 TABLET BY MOUTH EVERYDAY AT BEDTIME, Disp: 90 tablet, Rfl: 0   imipramine (TOFRANIL) 25 MG tablet, TAKE 1 TABLET BY MOUTH EVERYDAY AT BEDTIME, Disp: 90 tablet, Rfl: 1   meloxicam (MOBIC) 15 MG tablet, Take 1 tablet (15 mg total) by mouth daily as needed for pain., Disp: 30 tablet, Rfl: 2   methocarbamol (ROBAXIN) 500 MG tablet, Take 1 tablet (500 mg total) by mouth every 8 (eight) hours as needed for muscle spasms., Disp: 30 tablet, Rfl: 0   Multiple Vitamins-Minerals (CENTRUM SILVER 50+WOMEN) TABS, Take 1 tablet by mouth daily., Disp: , Rfl:    omeprazole (PRILOSEC) 20 MG capsule, TAKE 1 CAPSULE BY MOUTH EVERY DAY, Disp: 90 capsule, Rfl: 3   COLLAGEN PO, Take 1 Scoop by mouth daily., Disp: , Rfl:   PAST MEDICAL HISTORY: Past Medical History:  Diagnosis Date   Allergic rhinitis    Allergy    Anemia    as a teenager   Anxiety    history of panic attack   Anxiety associated with depression    Chronic headaches    migraines- not any longer   Depression    Essential hypertension 10/24/2015   Family history of adverse reaction to anesthesia    mother - N/V   GERD (gastroesophageal reflux disease)    History of alcoholism ([place])    Sober since 1994 Strong fmhx alcoholism   History of pneumonia    x2   Hyperlipidemia    IBS (irritable bowel syndrome)    Irritable bowel syndrome (IBS)    constipation   Microscopic colitis    Osteoarthritis 2000s   chronic back and shoulder pain s/p surgery   Pneumonia    had x 2 last time age 61   Post menopausal syndrome 10/24/2015   Substance abuse ([place])    alcohol abuse   Tubular adenoma of colon     PAST SURGICAL HISTORY: Past Surgical History:  Procedure Laterality Date   ABDOMINAL HYSTERECTOMY  1990   endometriosis   ANTERIOR CERVICAL DECOMP/DISCECTOMY FUSION  2006   C5/6 HNP ([place])  APPENDECTOMY      CHOLECYSTECTOMY N/A 10/19/2016   Procedure: LAPAROSCOPIC CHOLECYSTECTOMY;  Surgeon: Stark Klein, MD;  Location: [facility];  Service: General;  Laterality: N/A;   COLONOSCOPY  06/2011   TA, microscopic colitis, rpt 5 yrs ([place])   COLONOSCOPY  09/2016   39m polyp - tubular adenoma rpt 3 yrs ([place])   COLONOSCOPY  10/2019   186mHP, int hem, rpt 5 yrs ([place])   cyst on ovary     EYE SURGERY Bilateral    Lasik   LUMBAR DIGothaURGERY  2002   [place]   lumbar esi  2010   NOSE SURGERY     POLYPECTOMY     SHOULDER SURGERY Right    Rotatar Cuff   THORACIC LAMINECTOMY FOR SPINAL CORD STIMULATOR N/A 05/24/2021   Procedure: THORACIC SPINAL CORD STIMULATOR AND PULSE GENERATOR PLACEMENT (MEDTRONIC);  Surgeon: CoDeetta PerlaMD;  Location: [facility];  Service: Neurosurgery;  Laterality: N/A;   TONSILLECTOMY      FAMILY HISTORY: Family History  Problem Relation Age of Onset   Arthritis Mother    CAD Mother 7268     4v bypass   Arthritis Father    Hypertension Father    Alcohol abuse Brother    Colon polyps Brother    Heart disease Maternal Grandmother        CHF   Lung cancer Maternal Grandfather        (smoker)   Alcohol abuse Paternal Grandmother    Hyperlipidemia Paternal Grandmother    CAD Paternal Grandmother        MI x3   Stroke Paternal Grandmother    Hypertension Paternal Grandmother    Alcohol abuse Paternal Grandfather    Arthritis Paternal Grandfather    Heart disease Paternal Grandfather    Stroke Paternal Grandfather    Diabetes Paternal Grandfather    Throat cancer Maternal Aunt    CAD Paternal Uncle        MI   Stroke Paternal Uncle    Throat cancer Maternal Uncle    Colon cancer Neg Hx    Esophageal cancer Neg Hx    Stomach cancer Neg Hx    Rectal cancer Neg Hx     SOCIAL HISTORY:  Social History   Socioeconomic History   Marital status: Single    Spouse name: Not on file   Number of children: 0   Years of education: Not on file   Highest  education level: Not on file  Occupational History   Occupation: CLERK CLASS 1    Employer: [employer]    Comment: full time  Tobacco Use   Smoking status: Former    Years: 30.00    Types: Cigarettes    Quit date: 04/11/2000    Years since quitting: 22.1   Smokeless tobacco: Never  Vaping Use   Vaping Use: Never used  Substance and Sexual Activity   Alcohol use: No    Alcohol/week: 0.0 standard drinks of alcohol    Comment: hx   Drug use: No   Sexual activity: Yes  Other Topics Concern   Not on file  Social History Narrative   Daily caffeine- coffee (3-4 cups per day)   Right handed    Lives alone   Social Determinants of Health   Financial Resource Strain: Not on file  Food Insecurity: Not on file  Transportation Needs: Not on file  Physical Activity: Not on file  Stress: Not on file  Social  Connections: Not on file  Intimate Partner Violence: Not on file     PHYSICAL EXAM  Vitals:   05/17/22 1443  BP: (!) 150/93  Pulse: 77  Weight: 147 lb 8 oz (66.9 kg)  Height: '5\' 4"'$  (1.626 m)    Body mass index is 25.32 kg/m.   General: The patient is well-developed and well-nourished and in no acute distress  HEENT:  Head is [place]/AT.  Sclera are anicteric.  Tympanic membranes are intact.  Neck:    The neck is nontender.   Skin: Extremities are without rash or  edema.   Neurologic Exam  Mental status: The patient is alert and oriented x 3 at the time of the examination. The patient has apparent normal recent and remote memory, with an apparently normal attention span and concentration ability.   Speech is normal.  Cranial nerves: Extraocular movements are full.  . There is good facial sensation to soft touch bilaterally.Facial strength is normal.  Trapezius and sternocleidomastoid strength is normal. No dysarthria is noted.   No obvious hearing deficits are noted.  The Weber did not lateralize  Motor:  Muscle bulk is normal.   Tone is normal. Strength is  5  / 5 in right arm and 4+/5 in triceps and pronators but 5/5 elsewhere in left arm .  4+/5 in left EHL muscle of the foot 5/5 elsewhere  Sensory: Sensory testing shows reduced touch but more symmetric vibration in legs vs arms.     Coordination: Cerebellar testing reveals good finger-nose-finger and mildly reduced heel-to-shin bilaterally.  Gait and station: Station is normal.   Gait is mildly wide and tandem is wide.  Romberg is negative.  Reflexes: Deep tendon reflexes are symmetric and normal in arms but increased at knees, left > right. No clonus        DIAGNOSTIC DATA (LABS, IMAGING, TESTING) - I reviewed patient records, labs, notes, testing and imaging myself where available.  Lab Results  Component Value Date   WBC 7.2 04/26/2021   HGB 13.8 04/26/2021   HCT 41.0 04/26/2021   MCV 91.8 04/26/2021   PLT 308.0 04/26/2021      Component Value Date/Time   NA 131 (L) 04/25/2022 0747   NA 139 10/24/2012 0000   K 4.4 04/25/2022 0747   K 4.1 10/24/2012 0000   CL 93 (L) 04/25/2022 0747   CO2 28 04/25/2022 0747   GLUCOSE 96 04/25/2022 0747   BUN 7 04/25/2022 0747   CREATININE 0.76 04/25/2022 0747   CREATININE 0.95 11/01/2019 1554   CALCIUM 9.3 04/25/2022 0747   CALCIUM 10.5 10/24/2012 0000   PROT 6.9 04/08/2020 0741   ALBUMIN 4.3 04/08/2020 0741   ALBUMIN 4.1 08/21/2019 0913   AST 20 04/08/2020 0741   ALT 12 04/08/2020 0741   ALKPHOS 49 04/08/2020 0741   BILITOT 0.4 04/08/2020 0741   GFRNONAA >60 10/19/2016 0955   GFRAA >60 10/19/2016 0955   Lab Results  Component Value Date   CHOL 209 (H) 04/25/2022   HDL 80.00 04/25/2022   LDLCALC 113 (H) 04/25/2022   TRIG 79.0 04/25/2022   CHOLHDL 3 04/25/2022   Lab Results  Component Value Date   HGBA1C 5.7 03/07/2018   Lab Results  Component Value Date   VITAMINB12 939 (H) 04/26/2021   Lab Results  Component Value Date   TSH 2.25 04/25/2022       ASSESSMENT AND PLAN  Transverse myelitis (HCC)  Chronic pain  syndrome  Burning sensation of feet  White matter abnormality on MRI of brain  Gait disturbance   1.   Possible MS.   She has 3 foci in the spinal cord worrisome for MS but the brain is normal for age with just a couple T2/FLAIR hyperintense foci.  There has been no change in MRIs over the last decade though there was progression between 2006 and 2011.    CSF did not show OCB.   Our plan is to check MRI one more time in mid 2024 (brain and cerv spine) to determine if any progression - if no further changes, she either does not have MS or has mild inactive MS 2.   She will discontinue imipramine for dysesthesias.   As gabapentin causes sleepiness she does not take during the day.   We will begin lamotrigine and titrate up as tolerated.      3.    Return in 6 months or sooner if there are new or worsening neurologic symptoms.     Jandy Brackens A. Felecia Shelling, MD, [facility] 44/[id number]/[id number], 1:49 PM Certified in Neurology, Clinical Neurophysiology, Sleep Medicine and Neuroimaging  [facility] [address] [place], [place] [address] [phone]

## 2022-05-23 ENCOUNTER — Ambulatory Visit
Payer: BC Managed Care – PPO | Attending: Student in an Organized Health Care Education/Training Program | Admitting: Student in an Organized Health Care Education/Training Program

## 2022-05-23 ENCOUNTER — Encounter: Payer: Self-pay | Admitting: Student in an Organized Health Care Education/Training Program

## 2022-05-23 VITALS — BP 141/93 | HR 67 | Temp 97.2°F | Resp 16 | Ht 64.0 in | Wt 147.0 lb

## 2022-05-23 DIAGNOSIS — M5416 Radiculopathy, lumbar region: Secondary | ICD-10-CM | POA: Insufficient documentation

## 2022-05-23 DIAGNOSIS — M961 Postlaminectomy syndrome, not elsewhere classified: Secondary | ICD-10-CM | POA: Diagnosis not present

## 2022-05-23 DIAGNOSIS — G894 Chronic pain syndrome: Secondary | ICD-10-CM | POA: Insufficient documentation

## 2022-05-23 DIAGNOSIS — G8929 Other chronic pain: Secondary | ICD-10-CM | POA: Insufficient documentation

## 2022-05-23 DIAGNOSIS — M546 Pain in thoracic spine: Secondary | ICD-10-CM | POA: Insufficient documentation

## 2022-05-23 MED ORDER — GABAPENTIN 400 MG PO CAPS: 400.0000 mg | ORAL_CAPSULE | Freq: Two times a day (BID) | ORAL | 2 refills | Status: DC

## 2022-05-23 NOTE — Progress Notes (Signed)
Safety precautions to be maintained throughout the outpatient stay will include: orient to surroundings, keep bed in low position, maintain call bell within reach at all times, provide assistance with transfer out of bed and ambulation.  

## 2022-05-23 NOTE — Progress Notes (Signed)
PROVIDER NOTE: Information contained herein reflects review and annotations entered in association with encounter. Interpretation of such information and data should be left to medically-trained personnel. Information provided to patient can be located elsewhere in the medical record under "Patient Instructions". Document created using STT-dictation technology, any transcriptional errors that may result from process are unintentional.    Patient: Elizabeth Long  Service Category: E/M  Provider: Gillis Santa, MD  DOB: 1961/05/04  DOS: 05/23/2022  Referring Provider: Ria Bush, MD  MRN: [id number]  Specialty: Interventional Pain Management  PCP: Ria Bush, MD  Type: Established Patient  Setting: Ambulatory outpatient    Location: Office  Delivery: Face-to-face     HPI  [name], a 61 y.o. year old female, is here today because of her Thoracic spine pain [M54.6]. Elizabeth Long primary complain today is Back Pain (Lumbar ) Last encounter: My last encounter with her was on 03/17/2022  Pain Assessment: Severity of Chronic pain is reported as a 6 /10. Location: Back Lower, Right/down the right leg. Onset: More than a month ago. Quality: Discomfort, Constant, Sharp. Timing: Constant. Modifying factor(s): gabapentin helps at night, does not take during the day d/t making her sleepy. Vitals:  height is _0  (1.626 m) and weight is 147 lb (66.7 kg). Her temporal temperature is 97.2 F (36.2 C) (abnormal). Her blood pressure is 141/93 (abnormal) and her pulse is 67. Her respiration is 16 and oxygen saturation is 100%.   Reason for encounter:   Patient endorses benefit with Mobic.  She still has 2 refills present on her pharmacy.  Checked her creatinine and GFR which are within normal limits.  Caution her on long-term, chronic use of NSAIDs.  Continue gabapentin as prescribed.  Follow-up in 3 months.  HPI from 03/17/22 Elizabeth Long presents today with low back pain with radiation down to her  right leg and toes.  She has a history of prior lumbar spine surgery, L4-L5 laminectomy.  She is status post spinal cord stimulator implant however she had an accident at the beginning of June which is still causing her to have increased pain.  She states that she is also dealing with increased stress and has gone back to work.  She states that the pain does radiate down her legs makes it difficult to complete her chores and work activities by the end of the day.  She is currently on gabapentin 300 mg twice a day without any side effects.  We discussed increasing dose to 400 mg twice a day.  Also recommend a 30-day course of meloxicam 15 mg daily.  Continue with spinal cord stimulation.  She also received intra-articular knee injections from her primary care provider which she states provided some pain relief.  ROS  Constitutional: Denies any fever or chills Gastrointestinal: No reported hemesis, hematochezia, vomiting, or acute GI distress Musculoskeletal:  Low back pain with radiation into right leg Neurological: No reported episodes of acute onset apraxia, aphasia, dysarthria, agnosia, amnesia, paralysis, loss of coordination, or loss of consciousness  Medication Review  Centrum Silver 50+Women, LORazepam, acetaminophen-codeine, cetirizine, diclofenac Sodium, escitalopram, ferrous sulfate, fluticasone, gabapentin, hydrOXYzine, imipramine, lamoTRIgine, meloxicam, methocarbamol, and omeprazole  History Review  Allergy: Elizabeth Long is allergic to cefotan [cefotetan disodium] and elemental sulfur. Drug: Elizabeth Long  reports no history of drug use. Alcohol:  reports no history of alcohol use. Tobacco:  reports that she quit smoking about 22 years ago. Her smoking use included cigarettes. She has never used smokeless tobacco. Social:  Elizabeth Long  reports that she quit smoking about 22 years ago. Her smoking use included cigarettes. She has never used smokeless tobacco. She reports that she does not drink  alcohol and does not use drugs. Medical:  has a past medical history of Allergic rhinitis, Allergy, Anemia, Anxiety, Anxiety associated with depression, Chronic headaches, Depression, Essential hypertension (10/24/2015), Family history of adverse reaction to anesthesia, GERD (gastroesophageal reflux disease), History of alcoholism ([place]), History of pneumonia, Hyperlipidemia, IBS (irritable bowel syndrome), Irritable bowel syndrome (IBS), Microscopic colitis, Osteoarthritis (2000s), Pneumonia, Post menopausal syndrome (10/24/2015), Substance abuse ([place]), and Tubular adenoma of colon. Surgical: Elizabeth Long  has a past surgical history that includes Anterior cervical decomp/discectomy fusion (2006); Lumbar disc surgery (2002); Appendectomy; Abdominal hysterectomy (1990); Shoulder surgery (Right); Tonsillectomy; Nose surgery; Colonoscopy (06/2011); lumbar esi (2010); Polypectomy; cyst on ovary; Colonoscopy (09/2016); Eye surgery (Bilateral); Cholecystectomy (N/A, 10/19/2016); Colonoscopy (10/2019); and Thoracic laminectomy for spinal cord stimulator (N/A, 05/24/2021). Family: family history includes Alcohol abuse in her brother, paternal grandfather, and paternal grandmother; Arthritis in her father, mother, and paternal grandfather; CAD in her paternal grandmother and paternal uncle; CAD (age of onset: 58) in her mother; Colon polyps in her brother; Diabetes in her paternal grandfather; Heart disease in her maternal grandmother and paternal grandfather; Hyperlipidemia in her paternal grandmother; Hypertension in her father and paternal grandmother; Lung cancer in her maternal grandfather; Stroke in her paternal grandfather, paternal grandmother, and paternal uncle; Throat cancer in her maternal aunt and maternal uncle.  Laboratory Chemistry Profile   Renal Lab Results  Component Value Date   BUN 7 04/25/2022   CREATININE 0.76 06/08/7251   BCR NOT APPLICABLE 66/44/0347   GFR 84.57 04/25/2022   GFRAA >60  10/19/2016   GFRNONAA >60 10/19/2016    Hepatic Lab Results  Component Value Date   AST 20 04/08/2020   ALT 12 04/08/2020   ALBUMIN 4.3 04/08/2020   ALKPHOS 49 04/08/2020   HCVAB NEGATIVE 02/10/2016    Electrolytes Lab Results  Component Value Date   NA 131 (L) 04/25/2022   K 4.4 04/25/2022   CL 93 (L) 04/25/2022   CALCIUM 9.3 04/25/2022    Bone Lab Results  Component Value Date   VD25OH 38.44 03/07/2018    Inflammation (CRP: Acute Phase) (ESR: Chronic Phase) Lab Results  Component Value Date   ESRSEDRATE 12 03/13/2019         Note: Above Lab results reviewed.  Recent Imaging Review  DG Lumbar Spine Complete W/Bend CLINICAL DATA:  Upper back pain and/or thoracic spine pain.; Low back pain  EXAM: THORACIC SPINE 2 VIEWS; LUMBAR SPINE - COMPLETE WITH BENDING VIEWS  COMPARISON:  08/23/2020 MRI lumbar spine  FINDINGS: Limited evaluation due to overlapping osseous structures and overlying soft tissues.  There is no evidence of thoracolumbar spine fracture. Moderate to severe intervertebral disc space narrowing and facet arthropathy at the L4-L5 L5-S1 levels.  No other significant bone abnormalities are identified.  Straightening of the normal lumbar lordosis likely due to positioning and degenerative changes. Stable mild retrolisthesis of L4 on L5. Otherwise alignment of the thoracolumbar spine is normal.  Right upper quadrant surgical clips. Neurostimulator with leads entering the posterior central canal at the T12-L1 level and tips terminating at the T7-T10 levels.  IMPRESSION: 1. No acute displaced fracture or traumatic listhesis of the thoracolumbar spine. 2. L4 through S1 moderate severe degenerative changes with stable mild retrolisthesis of L4 on L5.  Electronically Signed   By: Clelia Croft.D.  On: 12/03/2021 17:48 DG Thoracic Spine 2 View CLINICAL DATA:  Upper back pain and/or thoracic spine pain.; Low back pain  EXAM: THORACIC SPINE  2 VIEWS; LUMBAR SPINE - COMPLETE WITH BENDING VIEWS  COMPARISON:  08/23/2020 MRI lumbar spine  FINDINGS: Limited evaluation due to overlapping osseous structures and overlying soft tissues.  There is no evidence of thoracolumbar spine fracture. Moderate to severe intervertebral disc space narrowing and facet arthropathy at the L4-L5 L5-S1 levels.  No other significant bone abnormalities are identified.  Straightening of the normal lumbar lordosis likely due to positioning and degenerative changes. Stable mild retrolisthesis of L4 on L5. Otherwise alignment of the thoracolumbar spine is normal.  Right upper quadrant surgical clips. Neurostimulator with leads entering the posterior central canal at the T12-L1 level and tips terminating at the T7-T10 levels.  IMPRESSION: 1. No acute displaced fracture or traumatic listhesis of the thoracolumbar spine. 2. L4 through S1 moderate severe degenerative changes with stable mild retrolisthesis of L4 on L5.  Electronically Signed   By: Iven Finn M.D.   On: 12/03/2021 17:48 Note: Reviewed        Physical Exam  General appearance: Well nourished, well developed, and well hydrated. In no apparent acute distress Mental status: Alert, oriented x 3 (person, place, & time)       Respiratory: No evidence of acute respiratory distress Eyes: PERLA Vitals: BP (!) 141/93 (BP Location: Left Arm, Patient Position: Sitting, Cuff Size: Normal)   Pulse 67   Temp (!) 97.2 F (36.2 C) (Temporal)   Resp 16   Ht _0  (1.626 m)   Wt 147 lb (66.7 kg)   SpO2 100%   BMI 25.23 kg/m  BMI: Estimated body mass index is 25.23 kg/m as calculated from the following:   Height as of this encounter: _1  (1.626 m).   Weight as of this encounter: 147 lb (66.7 kg). Ideal: Ideal body weight: 54.7 kg (120 lb 9.5 oz) Adjusted ideal body weight: 59.5 kg (131 lb 2.5 oz)  Lumbar spine pain, dermatomal pain pattern, right L4-L5 Spinal cord stimulator IPG  present 5 out of 5 strength bilateral lower extremity: Plantar flexion, dorsiflexion, knee flexion, knee extension.   Assessment   Diagnosis   1. Thoracic spine pain   2. Postlaminectomy syndrome, lumbar region   3. Failed back surgical syndrome   4. Chronic radicular lumbar pain   5. Chronic pain syndrome         Plan of Care    Elizabeth Long has a current medication list which includes the following long-term medication(s): cetirizine, escitalopram, ferrous sulfate, fluticasone, imipramine, lamotrigine, omeprazole, and gabapentin.  Pharmacotherapy (Medications Ordered): Meds ordered this encounter  Medications   gabapentin (NEURONTIN) 400 MG capsule    Sig: Take 1 capsule (400 mg total) by mouth 2 (two) times daily.    Dispense:  60 capsule    Refill:  2   Consider caudal ESI if symptoms are not better Consider reprogramming/optimizing SCS settings [employer] spinal cord stimulator Patient instructed to refrain from NSAIDs while she is on Mobic, still has 2 prescription refills at pharmacy  Follow-up plan:   Return in about 3 months (around 08/22/2022) for Medication Management, in person.    Recent Visits Date Type Provider Dept  03/17/22 Office Visit Gillis Santa, MD [facility]  Showing recent visits within past 90 days and meeting all other requirements Today's Visits Date Type Provider Dept  05/23/22 Office Visit Gillis Santa, MD [facility]  Showing today's visits and meeting all other requirements Future Appointments Date Type Provider Dept  08/18/22 Appointment Gillis Santa, MD [facility]  Showing future appointments within next 90 days and meeting all other requirements  I discussed the assessment and treatment plan with the patient. The patient was provided an opportunity to ask questions and all were answered. The patient agreed with the plan and demonstrated an understanding of the instructions.  Patient advised to  call back or seek an in-person evaluation if the symptoms or condition worsens.  Duration of encounter: 30 minutes.  Total time on encounter, as per AMA guidelines included both the face-to-face and non-face-to-face time personally spent by the physician and/or other qualified health care professional(s) on the day of the encounter (includes time in activities that require the physician or other qualified health care professional and does not include time in activities normally performed by clinical staff). Physician's time may include the following activities when performed: preparing to see the patient (eg, review of tests, pre-charting review of records) obtaining and/or reviewing separately obtained history performing a medically appropriate examination and/or evaluation counseling and educating the patient/family/caregiver ordering medications, tests, or procedures referring and communicating with other health care professionals (when not separately reported) documenting clinical information in the electronic or other health record independently interpreting results (not separately reported) and communicating results to the patient/ family/caregiver care coordination (not separately reported)  Note by: Gillis Santa, MD Date: 05/23/2022; Time: 2:52 PM

## 2022-07-04 DIAGNOSIS — Z1231 Encounter for screening mammogram for malignant neoplasm of breast: Secondary | ICD-10-CM | POA: Diagnosis not present

## 2022-07-04 DIAGNOSIS — Z6824 Body mass index (BMI) 24.0-24.9, adult: Secondary | ICD-10-CM | POA: Diagnosis not present

## 2022-07-04 DIAGNOSIS — Z01419 Encounter for gynecological examination (general) (routine) without abnormal findings: Secondary | ICD-10-CM | POA: Diagnosis not present

## 2022-07-04 DIAGNOSIS — Z113 Encounter for screening for infections with a predominantly sexual mode of transmission: Secondary | ICD-10-CM | POA: Diagnosis not present

## 2022-07-04 LAB — HM MAMMOGRAPHY

## 2022-07-05 ENCOUNTER — Other Ambulatory Visit: Payer: Self-pay | Admitting: Student in an Organized Health Care Education/Training Program

## 2022-07-07 ENCOUNTER — Encounter: Payer: Self-pay | Admitting: Family Medicine

## 2022-08-18 ENCOUNTER — Encounter: Payer: BC Managed Care – PPO | Admitting: Student in an Organized Health Care Education/Training Program

## 2022-08-28 ENCOUNTER — Other Ambulatory Visit: Payer: Self-pay | Admitting: Family Medicine

## 2022-08-29 NOTE — Telephone Encounter (Signed)
Refill request Hydroxyzine Last refill 05/02/22 #90 Last office visit 05/02/22 Upcoming appointment 11/01/22

## 2022-08-31 ENCOUNTER — Other Ambulatory Visit: Payer: Self-pay | Admitting: Neurology

## 2022-09-01 NOTE — Telephone Encounter (Signed)
Last seen on 05/17/22 Follow up scheduled 11/16/22

## 2022-11-01 ENCOUNTER — Encounter: Payer: Self-pay | Admitting: Family Medicine

## 2022-11-01 ENCOUNTER — Ambulatory Visit: Payer: BC Managed Care – PPO | Admitting: Family Medicine

## 2022-11-01 VITALS — BP 136/82 | HR 63 | Temp 97.8°F | Ht 64.0 in | Wt 151.4 lb

## 2022-11-01 DIAGNOSIS — F418 Other specified anxiety disorders: Secondary | ICD-10-CM

## 2022-11-01 DIAGNOSIS — M25551 Pain in right hip: Secondary | ICD-10-CM

## 2022-11-01 DIAGNOSIS — M159 Polyosteoarthritis, unspecified: Secondary | ICD-10-CM

## 2022-11-01 DIAGNOSIS — I10 Essential (primary) hypertension: Secondary | ICD-10-CM

## 2022-11-01 DIAGNOSIS — E871 Hypo-osmolality and hyponatremia: Secondary | ICD-10-CM | POA: Insufficient documentation

## 2022-11-01 DIAGNOSIS — G894 Chronic pain syndrome: Secondary | ICD-10-CM

## 2022-11-01 DIAGNOSIS — M545 Low back pain, unspecified: Secondary | ICD-10-CM

## 2022-11-01 DIAGNOSIS — G8929 Other chronic pain: Secondary | ICD-10-CM

## 2022-11-01 DIAGNOSIS — G373 Acute transverse myelitis in demyelinating disease of central nervous system: Secondary | ICD-10-CM

## 2022-11-01 DIAGNOSIS — M792 Neuralgia and neuritis, unspecified: Secondary | ICD-10-CM

## 2022-11-01 LAB — BASIC METABOLIC PANEL
BUN: 9 mg/dL (ref 6–23)
CO2: 28 mEq/L (ref 19–32)
Calcium: 9.5 mg/dL (ref 8.4–10.5)
Chloride: 97 mEq/L (ref 96–112)
Creatinine, Ser: 0.73 mg/dL (ref 0.40–1.20)
GFR: 88.43 mL/min (ref >60.00)
Glucose, Bld: 88 mg/dL (ref 70–99)
Potassium: 4 mEq/L (ref 3.5–5.1)
Sodium: 134 mEq/L — ABNORMAL LOW (ref 135–145)

## 2022-11-01 LAB — SEDIMENTATION RATE: Sed Rate: 27 mm/hr (ref 0–30)

## 2022-11-01 MED ORDER — ACETAMINOPHEN-CODEINE 300-30 MG PO TABS: 1.0000 | ORAL_TABLET | Freq: Two times a day (BID) | ORAL | 0 refills | Status: DC | PRN

## 2022-11-01 NOTE — Assessment & Plan Note (Signed)
Update sodium levels off thiazide diuretic (HCTZ).

## 2022-11-01 NOTE — Patient Instructions (Addendum)
Update labs today  I think you have R hip bursitis - do exercises provided today.  Let us know if not better with this.  Return in 6 months for physical.

## 2022-11-01 NOTE — Assessment & Plan Note (Signed)
She requests refill of tylenol #3 (last filled 10/2021 #20). Uses sparingly for breakthrough back pain

## 2022-11-01 NOTE — Assessment & Plan Note (Signed)
Chronic work related stressors.  Continue current regimen including lexapro 10mg  daily.

## 2022-11-01 NOTE — Assessment & Plan Note (Signed)
Notes some swelling to digits. Check ESR.  Previous RF normal (2020). Prednisone did not improve symptoms previously (2020).

## 2022-11-01 NOTE — Assessment & Plan Note (Signed)
Appreciate neurology care.  

## 2022-11-01 NOTE — Assessment & Plan Note (Signed)
Chronic, stable period off medication.  ?

## 2022-11-01 NOTE — Assessment & Plan Note (Signed)
Never stopped imipramine, has started lamictal. Will f/u with neurology and PM&R.

## 2022-11-01 NOTE — Assessment & Plan Note (Signed)
Anticipate pain from greater trochanteric pain syndrome.  Provided with exercises from [facility] pt advisor.

## 2022-11-01 NOTE — Progress Notes (Signed)
Ph: [phone] Fax: [phone]   Patient ID: Elizabeth Long, female    DOB: 1960/12/20, 62 y.o.   MRN: [id number]  This visit was conducted in person.  BP 136/82   Pulse 63   Temp 97.8 F (36.6 C) (Temporal)   Ht 5\' 4"  (1.626 m)   Wt 151 lb 6 oz (68.7 kg)   SpO2 98%   BMI 25.98 kg/m   BP Readings from Last 3 Encounters:  11/01/22 136/82  05/23/22 (!) 141/93  05/17/22 (!) 150/93   CC: 6 mo f/u visit  Subjective:   HPI: Elizabeth Long is a 62 y.o. female presenting on 11/01/2022 for Medical Management of Chronic Issues (Here for 6 mo f/u.)   MS vs transverse myelitis sees Dr Epimenio Foot regularly.  Sees Dr Cherylann Ratel for chronic thoracic spine pain, on mobic with voltaren gel. Also takes methocarbamol PRN and gabapentin 400mg  predominantly at night time. She has spinal cord stimulator in place.  We previously prescribed tylenol #3 for breakthrough pain last #20 10/2021.   Notes worsening arthritis pain - points to R lateral hip pain, as well as lumbar spine. Worse pain with rain.   Mood anxiety > depression - stable on Lexapro 10mg  daily. Notes ongoing dysesthesia to lower legs. She never stopped imipramine, lamotrigine started, now on 50mg  bid. Notes ongoing difficulty with work related stress.   Takes lorazepam 1mg  at bedtime for insomnia through GYN.   Last visit we stopped hctz due to low sodium levels. She notes worsening swelling over the past 3-4 wks to fingers and ankles - mild. No redness or swelling to joints.  She doesn't check BP at home. She is limiting sodium/salt in diet, good water intake.      Relevant past medical, surgical, family and social history reviewed and updated as indicated. Interim medical history since our last visit reviewed. Allergies and medications reviewed and updated. Outpatient Medications Prior to Visit  Medication Sig Dispense Refill   cetirizine (ZYRTEC) 10 MG tablet Take 10 mg by mouth daily as needed for allergies.     diclofenac  Sodium (VOLTAREN) 1 % GEL Apply 2 g topically daily as needed (pain).     escitalopram (LEXAPRO) 10 MG tablet Take 1 tablet (10 mg total) by mouth daily. 90 tablet 3   ferrous sulfate 324 (65 Fe) MG TBEC Take 1 tablet (325 mg total) by mouth daily.     fluticasone (FLONASE) 50 MCG/ACT nasal spray Place 2 sprays into both nostrils daily as needed for allergies or rhinitis.     hydrOXYzine (ATARAX) 25 MG tablet TAKE 1 TABLET BY MOUTH EVERYDAY AT BEDTIME 90 tablet 0   imipramine (TOFRANIL) 25 MG tablet TAKE 1 TABLET BY MOUTH EVERYDAY AT BEDTIME 90 tablet 3   lamoTRIgine (LAMICTAL) 25 MG tablet Take 2 pill by mouth twice daily 300 tablet 0   LORazepam (ATIVAN) 2 MG tablet Take 1 mg by mouth at bedtime as needed.     meloxicam (MOBIC) 15 MG tablet Take 1 tablet (15 mg total) by mouth daily as needed for pain. (Patient taking differently: Take 15 mg by mouth daily as needed for pain. Takes 0.5 tablet) 30 tablet 2   methocarbamol (ROBAXIN) 500 MG tablet Take 1 tablet (500 mg total) by mouth every 8 (eight) hours as needed for muscle spasms. 30 tablet 0   Multiple Vitamins-Minerals (CENTRUM SILVER 50+WOMEN) TABS Take 1 tablet by mouth daily.     omeprazole (PRILOSEC) 20 MG capsule TAKE 1  CAPSULE BY MOUTH EVERY DAY 90 capsule 3   acetaminophen-codeine (TYLENOL #3) 300-30 MG tablet Take 1 tablet by mouth 2 (two) times daily as needed for moderate pain. 20 tablet 0   gabapentin (NEURONTIN) 400 MG capsule Take 1 capsule (400 mg total) by mouth 2 (two) times daily. 60 capsule 2   No facility-administered medications prior to visit.     Per HPI unless specifically indicated in ROS section below Review of Systems  Objective:  BP 136/82   Pulse 63   Temp 97.8 F (36.6 C) (Temporal)   Ht 5\' 4"  (1.626 m)   Wt 151 lb 6 oz (68.7 kg)   SpO2 98%   BMI 25.98 kg/m   Wt Readings from Last 3 Encounters:  11/01/22 151 lb 6 oz (68.7 kg)  05/23/22 147 lb (66.7 kg)  05/17/22 147 lb 8 oz (66.9 kg)       Physical Exam Vitals and nursing note reviewed.  Constitutional:      Appearance: Normal appearance. She is not ill-appearing.  HENT:     Head: Normocephalic and atraumatic.     Mouth/Throat:     Mouth: Mucous membranes are moist.     Pharynx: Oropharynx is clear. No oropharyngeal exudate or posterior oropharyngeal erythema.  Eyes:     Extraocular Movements: Extraocular movements intact.     Conjunctiva/sclera: Conjunctivae normal.     Pupils: Pupils are equal, round, and reactive to light.  Cardiovascular:     Rate and Rhythm: Normal rate and regular rhythm.     Pulses: Normal pulses.     Heart sounds: Normal heart sounds. No murmur heard. Pulmonary:     Effort: Pulmonary effort is normal. No respiratory distress.     Breath sounds: Normal breath sounds. No wheezing, rhonchi or rales.  Musculoskeletal:     Right lower leg: No edema.     Left lower leg: No edema.     Comments:  Mid lumbar spine discomfort to palpation as well as paraspinous mm tenderness No pain with int/ext rotation at hip. Tender to palpation at R SIJ as well as R GTB  Skin:    General: Skin is warm and dry.     Findings: No erythema or rash.  Neurological:     Mental Status: She is alert.  Psychiatric:        Mood and Affect: Mood normal.        Behavior: Behavior normal.       Results for orders placed or performed in visit on 07/07/22  HM MAMMOGRAPHY  Result Value Ref Range   HM Mammogram 0-4 Bi-Rad 0-4 Bi-Rad, Self Reported Normal    Assessment & Plan:   Problem List Items Addressed This Visit     Anxiety associated with depression    Chronic work related stressors.  Continue current regimen including lexapro 10mg  daily.       Osteoarthritis    Notes some swelling to digits. Check ESR.  Previous RF normal (2020). Prednisone did not improve symptoms previously (2020).       Relevant Medications   acetaminophen-codeine (TYLENOL #3) 300-30 MG tablet   Other Relevant Orders    Sedimentation rate   Essential hypertension    Chronic, stable period off medication.       Chronic lower back pain   Relevant Medications   acetaminophen-codeine (TYLENOL #3) 300-30 MG tablet   Transverse myelitis (HCC)    Appreciate neurology care       Neuropathic pain  Never stopped imipramine, has started lamictal. Will f/u with neurology and PM&R.       Chronic pain syndrome    She requests refill of tylenol #3 (last filled 10/2021 #20). Uses sparingly for breakthrough back pain      Relevant Medications   acetaminophen-codeine (TYLENOL #3) 300-30 MG tablet   Lateral pain of right hip    Anticipate pain from greater trochanteric pain syndrome.  Provided with exercises from [facility] pt advisor.       Hyponatremia - Primary    Update sodium levels off thiazide diuretic (HCTZ).       Relevant Orders   Basic metabolic panel     Meds ordered this encounter  Medications   acetaminophen-codeine (TYLENOL #3) 300-30 MG tablet    Sig: Take 1 tablet by mouth 2 (two) times daily as needed for moderate pain.    Dispense:  20 tablet    Refill:  0    Orders Placed This Encounter  Procedures   Basic metabolic panel   Sedimentation rate    Patient Instructions  Update labs today  I think you have R hip bursitis - do exercises provided today.  Let us know if not better with this.  Return in 6 months for physical.   Follow up plan: Return in about 6 months (around 05/04/2023), or if symptoms worsen or fail to improve, for annual exam, prior fasting for blood work.  Eustaquio Boyden, MD

## 2022-11-10 MED ORDER — HYDROCODONE-ACETAMINOPHEN 5-325 MG PO TABS
0.5000 | ORAL_TABLET | Freq: Two times a day (BID) | ORAL | 0 refills | Status: AC | PRN
Start: 2022-11-10 — End: ?

## 2022-11-10 NOTE — Telephone Encounter (Signed)
[place] CSRS reviewed.  See mychart messages - I never received request for alternative to tylenol #3.  Please call pharmacy to see why alternative is needed. Shortage? Other reason?

## 2022-11-10 NOTE — Telephone Encounter (Signed)
Spoke with Marylene Land asking about alternative request for Tylenol #3 and notifying her we never received a message. She apologizes and states it's on [employer].

## 2022-11-16 ENCOUNTER — Encounter: Payer: Self-pay | Admitting: Neurology

## 2022-11-16 ENCOUNTER — Ambulatory Visit: Payer: BC Managed Care – PPO | Admitting: Neurology

## 2022-11-16 VITALS — BP 162/87 | HR 59 | Ht 64.0 in | Wt 152.5 lb

## 2022-11-16 DIAGNOSIS — R413 Other amnesia: Secondary | ICD-10-CM | POA: Diagnosis not present

## 2022-11-16 DIAGNOSIS — F418 Other specified anxiety disorders: Secondary | ICD-10-CM

## 2022-11-16 DIAGNOSIS — G4700 Insomnia, unspecified: Secondary | ICD-10-CM

## 2022-11-16 DIAGNOSIS — R269 Unspecified abnormalities of gait and mobility: Secondary | ICD-10-CM | POA: Diagnosis not present

## 2022-11-16 DIAGNOSIS — G373 Acute transverse myelitis in demyelinating disease of central nervous system: Secondary | ICD-10-CM | POA: Diagnosis not present

## 2022-11-16 DIAGNOSIS — G894 Chronic pain syndrome: Secondary | ICD-10-CM

## 2022-11-16 MED ORDER — ESCITALOPRAM OXALATE 20 MG PO TABS
20.0000 mg | ORAL_TABLET | Freq: Every day | ORAL | 1 refills | Status: DC
Start: 2022-11-16 — End: 2023-05-08

## 2022-11-16 MED ORDER — LAMOTRIGINE 100 MG PO TABS: ORAL_TABLET | ORAL | 3 refills | Status: DC

## 2022-11-16 NOTE — Progress Notes (Signed)
Elizabeth Long  PATIENT: Elizabeth Long DOB: [birth year]  REFERRING DOCTOR OR PCP: Elizabeth Boyden MD SOURCE: Patient, notes from primary care, imaging and lab reports, MRI images of the cervical and lumbar spine personally reviewed  _________________________________   HISTORICAL  CHIEF COMPLAINT:  Chief Complaint  Patient presents with   Follow-up    Rm11: Transverse myelitis, Chronic pain syndrome, Burning sensation of feet, Gait disturbance  Patient stated that memory has gotten worse MOCA:25 , fatigue is awful, she started having swelling in hands and feet 2 months ago, feet burning causes her not to be able to rest, Long pain is increasing, bilateral tinnitus. At her regular MD visit she said her pcp mentioned he read Elizabeth Long would be taking off of impramine and she wanted to make sure if she should be taking or not.        HISTORY OF PRESENT ILLNESS:  Elizabeth Long is a 62 y.o. woman with numbness and gait issues and T2 hyperintense foci i spinal cord and brain.  Update 11/16/2022 She had a transverse myelitis in the past.   Her gait is fine but balance is mildly off - unchanged from last year.   She denies weakness.   She has numbness in both legs.   She has incomplete bladder emptying.   She denies any visual disturbances.   She is reporting more pain and it interferes with her sleep.    She denies depression.   However, she is apathetic and has more fatigue. She feels her memory is worse.        She has insomnia and takes gabapentin (300 mg qHS) and lorazepam 2 mg     She is working Solicitor).    She uses reminders at work and home.  She snores a little but no OSA signs.    She sometimes wakes up and can't fall back asleep.    She has some leg discomfort but is doing better with less spasms since starting .  At the last visit we started lamotrigine and titrated to 50 mg po bid.   .She is still taking imipramine.     She has a SCS (Elizabeth Long Elizabeth Long)  She  is noting more spasms in her lower legs/feet and sometimes thigh.   She notes more discomfort in the legs.    She has numbness in her feet since 2019  with dysesthesias feeling like sandpaper.  She also has a different dysesthetic    Arms are usually fine.      She still has tingling in her ears.    She is noting more issues with memory and fatigue.           11/16/2022    3:18 PM  Montreal Cognitive Assessment   Visuospatial/ Executive (0/5) 4  Naming (0/3) 3  Attention: Read list of digits (0/2) 1  Attention: Read list of letters (0/1) 1  Attention: Serial 7 subtraction starting at 100 (0/3) 2  Language: Repeat phrase (0/2) 2  Language : Fluency (0/1) 1  Abstraction (0/2) 2  Delayed Recall (0/5) 2  Orientation (0/6) 6  Total 24  Adjusted Score (based on education) 25  When I asked her the 5 words 20 minutes later she did better - spontaneously got 3 and was 5/5 with category hint.     History of neurologic symptoms and subsequent studies: Around 2008, she began to experience symptoms in her neck and arm.  These would fluctuate.  In 2011, an MRI of the cervical spine showed a focus adjacent to C2-C3 towards the left (not present in 2006).  At the time, she actually had more symptoms in the right arm.  At some point she had Long at C5-C6 (ACDF).  She did well and have less symptoms for many years but then began to experience more numbness in the arms and legs.  In 2021, she was noted on MRI to have a T2 hyperintense focus adjacent to L1 and may be a second 1 adjacent to T12 in the spinal cord.  She also had degenerative changes.  CSF 08/21/2019 did not show oligoclonal bands.  Repeat imaging in 2020 one of the cervical spine just showed he same C2-C3 focus, diffusion and progressive degenerative changes C4-C5.  MRI of the brain shows only a few T2 hyperintense foci.  I reviewed imaging from 2008, 2011, and 2021.  The focus at C2-C3 appears to have developed between the  2008 and 2011 MRI of the cervical spine.  The focus in the conus medullaris appears to have occurred after the 2013 MRI and before the 2021 MRI.  There is only 1 MRI of the brain.   Studies:  Imaging:  MRI of the cervical spine 03/18/2010.  The cervical spine shows a left lateral focus at C2-C3 that could be consistent with a demyelinating plaque.  That focus was not present in 2008  MRI of the lumbar spine 07/18/2019  shows a focus adjacent to T12 to the right and possibly 1 within the conus adjacent to L1 to the anterolaterally to the left.   At [facility] disc protrusion could affect left L3 nerve root and at L4L5 protrusion could affect left L5 nerve root.    MRI of the brain and cervical spine 08/13/2019 shows a T2 hyperintense focus within the spinal cord adjacent to C2-C3 which had been seen previously.  There are 2-3 hemispheric small foci, including one juxtacortical one and a focus within the pons.  She has ACDF at C5-C6.  At C4-C5 there is retrolisthesis and other degenerative changes causing foraminal narrowing but no nerve root compression.  This has progressed compared to the previous MRI from 2011.   MRI brain 10/14/2020 showed  Several T2/FLAIR hyperintense foci in the white matter of the hemispheres and in the right pons.  They do not enhance or appear to be acute.  Compared to the MRI from 08/13/2019, there are no new lesions.  Though the overall pattern is nonspecific, the one focus in the periventricular white matter and the other in the juxtacortical white matter on the left could be consistent with chronic demyelinating plaque associated with MS.  They could also represent age-related chronic microvascular ischemic change.  MRI cervical spine 10/14/2020 showed   T2 hyperintense focus within the spinal cord laterally to the left.  The location could be consistent with demyelination from multiple sclerosis.  It does not enhance and is unchanged compared to the 08/13/2019 MRI.  Another T2  hyperintense focus is noted within the pons, also unchanged.      Remote fusion at C5-C6    At C3-C4, there is 2 mm anterolisthesis and other degenerative changes causing moderately severe right and moderate left foraminal narrowing but no spinal stenosis.  There is potential for right C4 nerve root compression.  This level has slightly progressed compared to the 2021 MRI.     At C4-C5, there are stable degenerative changes including 3 mm retrolisthesis.  There is moderate right  foraminal narrowing but no nerve root compression.  MRI thoracic spine 03/25/2021 showed a focus at T9   DATA: Lumbar puncture 08/21/2019 did not show any bands in the CSF not present in the serum though there were 3 identical gamma restricted bands in the CSF and serum.  IgG index was normal at 0.53.    REVIEW OF SYSTEMS: Constitutional: No fevers, chills, sweats, or change in appetite.   Insomnia and fatigue Eyes: No visual changes, double vision, eye pain Ear, nose and throat: No hearing loss, ear pain, nasal congestion, sore throat Cardiovascular: No chest pain, palpitations Respiratory:  No shortness of breath at rest or with exertion.   No wheezes GastrointestinaI: No nausea, vomiting, diarrhea, abdominal pain, fecal incontinence Genitourinary:  No dysuria, urinary retention or frequency.  No nocturia. Musculoskeletal:  Notes neck pain, back pain Integumentary: No rash, pruritus, skin lesions Neurological: as above Psychiatric: Notes depression and anxiety Endocrine: No palpitations, diaphoresis, change in appetite, change in weigh or increased thirst Hematologic/Lymphatic:  No anemia, purpura, petechiae. Allergic/Immunologic: No itchy/runny eyes, nasal congestion, recent allergic reactions, rashes  ALLERGIES: Allergies  Allergen Reactions   Cefotan [Cefotetan Disodium] Swelling   Elemental Sulfur Rash    Rash all over   Sulfa Antibiotics Rash    HOME MEDICATIONS:  Current Outpatient Medications:     cetirizine (ZYRTEC) 10 MG tablet, Take 10 mg by mouth daily as needed for allergies., Disp: , Rfl:    diclofenac Sodium (VOLTAREN) 1 % GEL, Apply 2 g topically daily as needed (pain)., Disp: , Rfl:    escitalopram (LEXAPRO) 10 MG tablet, Take 1 tablet (10 mg total) by mouth daily., Disp: 90 tablet, Rfl: 3   ferrous sulfate 324 (65 Fe) MG TBEC, Take 1 tablet (325 mg total) by mouth daily., Disp: , Rfl:    fluticasone (FLONASE) 50 MCG/ACT nasal spray, Place 2 sprays into both nostrils daily as needed for allergies or rhinitis., Disp: , Rfl:    gabapentin (NEURONTIN) 400 MG capsule, Take 1 capsule (400 mg total) by mouth 2 (two) times daily., Disp: 60 capsule, Rfl: 2   HYDROcodone-acetaminophen (NORCO/VICODIN) 5-325 MG tablet, Take 0.5-1 tablets by mouth 2 (two) times daily as needed for moderate pain., Disp: 10 tablet, Rfl: 0   hydrOXYzine (ATARAX) 25 MG tablet, TAKE 1 TABLET BY MOUTH EVERYDAY AT BEDTIME, Disp: 90 tablet, Rfl: 0   lamoTRIgine (LAMICTAL) 25 MG tablet, Take 2 pill by mouth twice daily, Disp: 300 tablet, Rfl: 0   LORazepam (ATIVAN) 2 MG tablet, Take 1 mg by mouth at bedtime as needed., Disp: , Rfl:    meloxicam (MOBIC) 15 MG tablet, Take 1 tablet (15 mg total) by mouth daily as needed for pain. (Patient taking differently: Take 15 mg by mouth daily as needed for pain. Takes 0.5 tablet), Disp: 30 tablet, Rfl: 2   methocarbamol (ROBAXIN) 500 MG tablet, Take 1 tablet (500 mg total) by mouth every 8 (eight) hours as needed for muscle spasms., Disp: 30 tablet, Rfl: 0   Multiple Vitamins-Minerals (CENTRUM SILVER 50+WOMEN) TABS, Take 1 tablet by mouth daily., Disp: , Rfl:    omeprazole (PRILOSEC) 20 MG capsule, TAKE 1 CAPSULE BY MOUTH EVERY DAY, Disp: 90 capsule, Rfl: 3  PAST MEDICAL HISTORY: Past Medical History:  Diagnosis Date   Allergic rhinitis    Allergy    Anemia    as a teenager   Anxiety    history of panic attack   Anxiety associated with depression    Chronic headaches  migraines- not any longer   Depression    Essential hypertension 10/24/2015   Family history of adverse reaction to anesthesia    mother - N/V   GERD (gastroesophageal reflux disease)    History of alcoholism (HCC)    Sober since 1994 Strong fmhx alcoholism   History of pneumonia    x2   Hyperlipidemia    IBS (irritable bowel syndrome)    Irritable bowel syndrome (IBS)    constipation   Microscopic colitis    Osteoarthritis 2000s   chronic back and shoulder pain s/p Long   Pneumonia    had x 2 last time age 58   Post menopausal syndrome 10/24/2015   Substance abuse (HCC)    alcohol abuse   Tubular adenoma of colon     PAST SURGICAL HISTORY: Past Surgical History:  Procedure Laterality Date   ABDOMINAL HYSTERECTOMY  1990   endometriosis   ANTERIOR CERVICAL DECOMP/DISCECTOMY FUSION  2006   C5/6 HNP ([facility])   APPENDECTOMY     CHOLECYSTECTOMY N/A 10/19/2016   Procedure: LAPAROSCOPIC CHOLECYSTECTOMY;  Surgeon: Almond Lint, MD;  Location: [facility] OR;  Service: General;  Laterality: N/A;   COLONOSCOPY  06/2011   TA, microscopic colitis, rpt 5 yrs ([facility])   COLONOSCOPY  09/2016   12mm polyp - tubular adenoma rpt 3 yrs (Nandigam)   COLONOSCOPY  10/2019   11mm HP, int hem, rpt 5 yrs (Nandigam)   cyst on ovary     EYE Long Bilateral    Lasik   LUMBAR DISC Long  2002   [facility]   lumbar esi  2010   NOSE Long     POLYPECTOMY     SHOULDER Long Right    Rotatar Cuff   THORACIC LAMINECTOMY FOR SPINAL CORD STIMULATOR N/A 05/24/2021   Procedure: THORACIC SPINAL CORD STIMULATOR AND PULSE GENERATOR PLACEMENT (MEDTRONIC);  Surgeon: Lucy Chris, MD;  Location: [facility] ORS;  Service: Neurosurgery;  Laterality: N/A;   TONSILLECTOMY      FAMILY HISTORY: Family History  Problem Relation Age of Onset   Arthritis Mother    CAD Mother 56       4v bypass   Arthritis Father    Hypertension Father    Alcohol abuse Brother    Colon polyps Brother    Heart disease Maternal  Grandmother        CHF   Lung cancer Maternal Grandfather        (smoker)   Alcohol abuse Paternal Grandmother    Hyperlipidemia Paternal Grandmother    CAD Paternal Grandmother        MI x3   Stroke Paternal Grandmother    Hypertension Paternal Grandmother    Alcohol abuse Paternal Grandfather    Arthritis Paternal Grandfather    Heart disease Paternal Grandfather    Stroke Paternal Grandfather    Diabetes Paternal Grandfather    Throat cancer Maternal Aunt    CAD Paternal Uncle        MI   Stroke Paternal Uncle    Throat cancer Maternal Uncle    Colon cancer Neg Hx    Esophageal cancer Neg Hx    Stomach cancer Neg Hx    Rectal cancer Neg Hx     SOCIAL HISTORY:  Social History   Socioeconomic History   Marital status: Single    Spouse name: Not on file   Number of children: 0   Years of education: Not on file   Highest education level: Not on file  Occupational History   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    Employer: [employer]    Comment: full time  Tobacco Use   Smoking status: Former    Years: 30    Types: Cigarettes    Quit date: 04/11/2000    Years since quitting: 22.6   Smokeless tobacco: Never  Vaping Use   Vaping Use: Never used  Substance and Sexual Activity   Alcohol use: No    Alcohol/week: 0.0 standard drinks of alcohol    Comment: hx   Drug use: No   Sexual activity: Yes    Birth control/protection: None  Other Topics Concern   Not on file  Social History Narrative   Daily caffeine- coffee (3-4 cups per day)   Right handed    Lives alone   Social Determinants of Health   Financial Resource Strain: Not on file  Food Insecurity: Not on file  Transportation Needs: Not on file  Physical Activity: Not on file  Stress: Not on file  Social Connections: Not on file  Intimate Partner Violence: Not on file     PHYSICAL EXAM  Vitals:   11/16/22 1511  BP: (!) 155/85  Pulse: 64  Weight: 152 lb 8 oz (69.2 kg)  Height: 5\' 4"  (1.626 m)     Body mass index is 26.18 kg/m.   General: The patient is well-developed and well-nourished and in no acute distress  HEENT:  Head is Lamb/AT.  Sclera are anicteric.  Tympanic membranes are intact.  Neck:    The neck is nontender.   Skin: Extremities are without rash or  edema.   Neurologic Exam  Mental status: The patient is alert and oriented x 3 at the time of the examination. The patient has apparent normal recent and remote memory, with an apparently normal attention span and concentration ability.   Speech is normal.  Cranial nerves: Extraocular movements are full.  . There is good facial sensation to soft touch bilaterally.Facial strength is normal.  Trapezius and sternocleidomastoid strength is normal. No dysarthria is noted.   No obvious hearing deficits are noted.  The Weber did not lateralize  Motor:  Muscle bulk is normal.   Tone is normal. Strength is  5 / 5 in right arm and 4+/5 in triceps and pronators but 5/5 elsewhere in left arm .  4+/5 in left EHL muscle of the Long 5/5 elsewhere  Sensory: Sensory testing shows reduced touch but more symmetric vibration in legs vs arms.     Coordination: Cerebellar testing reveals good finger-nose-finger and mildly reduced heel-to-shin bilaterally.  Gait and station: Station is normal.   Gait is mildly wide and tandem is poor.  Romberg is negative.  Reflexes: Deep tendon reflexes are symmetric and normal in arms but increased at knees, left > right. No ankle clonus        DIAGNOSTIC DATA (LABS, IMAGING, TESTING) - I reviewed patient records, labs, notes, testing and imaging myself where available.  Lab Results  Component Value Date   WBC 7.2 04/26/2021   HGB 13.8 04/26/2021   HCT 41.0 04/26/2021   MCV 91.8 04/26/2021   PLT 308.0 04/26/2021      Component Value Date/Time   NA 134 (L) 11/01/2022 0846   NA 139 10/24/2012 0000   K 4.0 11/01/2022 0846   K 4.1 10/24/2012 0000   CL 97 11/01/2022 0846   CO2 28 11/01/2022  0846   GLUCOSE 88 11/01/2022 0846   BUN 9 11/01/2022 0846  CREATININE 0.73 11/01/2022 0846   CREATININE 0.95 11/01/2019 1554   CALCIUM 9.5 11/01/2022 0846   CALCIUM 10.5 10/24/2012 0000   PROT 6.9 04/08/2020 0741   ALBUMIN 4.3 04/08/2020 0741   ALBUMIN 4.1 08/21/2019 0913   AST 20 04/08/2020 0741   ALT 12 04/08/2020 0741   ALKPHOS 49 04/08/2020 0741   BILITOT 0.4 04/08/2020 0741   GFRNONAA >60 10/19/2016 0955   GFRAA >60 10/19/2016 0955   Lab Results  Component Value Date   CHOL 209 (H) 04/25/2022   HDL 80.00 04/25/2022   LDLCALC 113 (H) 04/25/2022   TRIG 79.0 04/25/2022   CHOLHDL 3 04/25/2022   Lab Results  Component Value Date   HGBA1C 5.7 03/07/2018   Lab Results  Component Value Date   VITAMINB12 939 (H) 04/26/2021   Lab Results  Component Value Date   TSH 2.25 04/25/2022       ASSESSMENT AND PLAN  Transverse myelitis (HCC)  Chronic pain syndrome  Gait disturbance  Depression with anxiety  Insomnia, unspecified type   1.    She has 3 foci in the spinal cord worrisome for MS but the brain has been normal for age with just a couple T2/FLAIR hyperintense foci.  There has been no change in MRIs over the last decade though there was progression between 2006 and 2011.    CSF did not show OCB.   Check MRI of brain and cervical spine one more time around next visit (prefers nt to do this year).   This will help to determine if any progression - if no further changes, she either does not have MS or has mild inactive MS 2.   She will continue gabapentin  q Evening and qHS.   We will increase lamotrigine to 100 mg po bid.  She is tolerating it well      3.  She is concerned about memory loss.  Problems with memory appear to be due to focus/attention not storage.  I do think she has depression as more pathetic.   Her mild STM issues likely due to reduced focus/attention possibly related to depression.   Increase Lexapro.  Discontinue imipramine 3.    Return in 8  months or sooner if there are new or worsening neurologic symptoms.     42-minute office visit with the majority of the time spent face-to-face for history and physical, discussion/counseling and decision-making.  Additional time with record review and documentation.    Sharron Petruska A. Epimenio Foot, MD, [facility] 11/16/2022, 3:56 PM Certified in Neurology, Clinical Neurophysiology, Sleep Medicine and Neuroimaging  [facility] [address] [place], [place] [address] [phone]

## 2022-11-26 ENCOUNTER — Other Ambulatory Visit: Payer: Self-pay | Admitting: Family Medicine

## 2022-11-26 DIAGNOSIS — F418 Other specified anxiety disorders: Secondary | ICD-10-CM

## 2022-12-20 ENCOUNTER — Telehealth: Payer: Self-pay

## 2022-12-20 NOTE — Patient Outreach (Signed)
  Care Coordination   12/20/2022 Name: ALYRICA THUROW MRN: [id number] DOB: [birth year]   Care Coordination Outreach Attempts:  An unsuccessful telephone outreach was attempted today to offer the patient information about available care coordination services. HIPAA compliant message left  Follow Up Plan:  Additional outreach attempts will be made to offer the patient care coordination information and services.   Encounter Outcome:  No Answer   Care Coordination Interventions:  No, not indicated    George Ina [facility] Care Coordination [phone] direct line

## 2022-12-27 ENCOUNTER — Telehealth: Payer: Self-pay

## 2022-12-27 NOTE — Patient Outreach (Signed)
  Care Coordination   12/27/2022 Name: Elizabeth Long MRN: [id number] DOB: [birth year]   Care Coordination Outreach Attempts:  Successful contact made with patient.  Patient states she is currently at work and request call on another day after 3 pm.    Follow Up Plan:  Additional outreach attempts will be made to offer the patient care coordination information and services.   Encounter Outcome:  Pt. Request to Call Back   Care Coordination Interventions:  No, not indicated    George Ina [facility] Care Coordination [phone] direct line

## 2023-01-15 ENCOUNTER — Encounter: Payer: Self-pay | Admitting: Family Medicine

## 2023-01-17 MED ORDER — MELOXICAM 7.5 MG PO TABS: 7.5000 mg | ORAL_TABLET | Freq: Every day | ORAL | 2 refills | Status: DC | PRN

## 2023-02-03 ENCOUNTER — Encounter: Payer: Self-pay | Admitting: Neurology

## 2023-02-03 ENCOUNTER — Other Ambulatory Visit: Payer: Self-pay | Admitting: Neurology

## 2023-02-03 DIAGNOSIS — G373 Acute transverse myelitis in demyelinating disease of central nervous system: Secondary | ICD-10-CM

## 2023-02-03 DIAGNOSIS — R269 Unspecified abnormalities of gait and mobility: Secondary | ICD-10-CM

## 2023-02-08 DIAGNOSIS — D3132 Benign neoplasm of left choroid: Secondary | ICD-10-CM | POA: Diagnosis not present

## 2023-02-08 DIAGNOSIS — H524 Presbyopia: Secondary | ICD-10-CM | POA: Diagnosis not present

## 2023-02-15 ENCOUNTER — Ambulatory Visit (INDEPENDENT_AMBULATORY_CARE_PROVIDER_SITE_OTHER): Payer: BC Managed Care – PPO

## 2023-02-15 ENCOUNTER — Encounter: Payer: Self-pay | Admitting: Neurology

## 2023-02-15 DIAGNOSIS — R269 Unspecified abnormalities of gait and mobility: Secondary | ICD-10-CM

## 2023-02-15 DIAGNOSIS — G373 Acute transverse myelitis in demyelinating disease of central nervous system: Secondary | ICD-10-CM

## 2023-02-15 MED ORDER — GADOBENATE DIMEGLUMINE 529 MG/ML IV SOLN
15.0000 mL | Freq: Once | INTRAVENOUS | Status: AC | PRN
  Administered 2023-02-15: 15 mL via INTRAVENOUS

## 2023-02-16 ENCOUNTER — Encounter: Payer: Self-pay | Admitting: Family Medicine

## 2023-02-22 ENCOUNTER — Other Ambulatory Visit: Payer: Self-pay | Admitting: Family Medicine

## 2023-02-28 MED ORDER — MELOXICAM 15 MG PO TABS: 15.0000 mg | ORAL_TABLET | Freq: Every day | ORAL | 1 refills | Status: DC | PRN

## 2023-03-20 ENCOUNTER — Other Ambulatory Visit: Payer: Self-pay | Admitting: Neurology

## 2023-03-20 ENCOUNTER — Encounter: Payer: Self-pay | Admitting: Neurology

## 2023-03-20 MED ORDER — LAMOTRIGINE 150 MG PO TABS: ORAL_TABLET | ORAL | 11 refills | Status: DC

## 2023-04-20 ENCOUNTER — Other Ambulatory Visit: Payer: Self-pay | Admitting: Family Medicine

## 2023-04-20 ENCOUNTER — Other Ambulatory Visit: Payer: Self-pay | Admitting: Neurology

## 2023-04-20 DIAGNOSIS — F418 Other specified anxiety disorders: Secondary | ICD-10-CM

## 2023-04-20 NOTE — Telephone Encounter (Signed)
Last seen on 11/16/22 Follow up scheduled on 07/19/22 Requesting refill too early

## 2023-04-24 ENCOUNTER — Encounter: Payer: Self-pay | Admitting: Family Medicine

## 2023-04-25 ENCOUNTER — Other Ambulatory Visit: Payer: Self-pay | Admitting: Family Medicine

## 2023-04-25 DIAGNOSIS — Z5181 Encounter for therapeutic drug level monitoring: Secondary | ICD-10-CM

## 2023-04-25 DIAGNOSIS — I10 Essential (primary) hypertension: Secondary | ICD-10-CM

## 2023-04-25 DIAGNOSIS — E041 Nontoxic single thyroid nodule: Secondary | ICD-10-CM

## 2023-05-01 ENCOUNTER — Other Ambulatory Visit (INDEPENDENT_AMBULATORY_CARE_PROVIDER_SITE_OTHER): Payer: BC Managed Care – PPO

## 2023-05-01 DIAGNOSIS — I10 Essential (primary) hypertension: Secondary | ICD-10-CM | POA: Diagnosis not present

## 2023-05-01 DIAGNOSIS — Z5181 Encounter for therapeutic drug level monitoring: Secondary | ICD-10-CM

## 2023-05-01 DIAGNOSIS — E041 Nontoxic single thyroid nodule: Secondary | ICD-10-CM

## 2023-05-01 LAB — CBC WITH DIFFERENTIAL/PLATELET
Basophils Absolute: 0 10*3/uL (ref 0.0–0.1)
Basophils Relative: 0.6 % (ref 0.0–3.0)
Eosinophils Absolute: 0.1 10*3/uL (ref 0.0–0.7)
Eosinophils Relative: 2.3 % (ref 0.0–5.0)
HCT: 39.4 % (ref 36.0–46.0)
Hemoglobin: 13.1 g/dL (ref 12.0–15.0)
Lymphocytes Relative: 45.4 % (ref 12.0–46.0)
Lymphs Abs: 2.1 10*3/uL (ref 0.7–4.0)
MCHC: 33.2 g/dL (ref 30.0–36.0)
MCV: 93.9 fL (ref 78.0–100.0)
Monocytes Absolute: 0.4 10*3/uL (ref 0.1–1.0)
Monocytes Relative: 9.1 % (ref 3.0–12.0)
Neutro Abs: 2 10*3/uL (ref 1.4–7.7)
Neutrophils Relative %: 42.6 % — ABNORMAL LOW (ref 43.0–77.0)
Platelets: 306 10*3/uL (ref 150.0–400.0)
RBC: 4.2 Mil/uL (ref 3.87–5.11)
RDW: 12.7 % (ref 11.5–15.5)
WBC: 4.6 10*3/uL (ref 4.0–10.5)

## 2023-05-01 LAB — LIPID PANEL
Cholesterol: 226 mg/dL — ABNORMAL HIGH (ref 0–200)
HDL: 58.4 mg/dL (ref >39.00)
LDL Cholesterol: 142 mg/dL — ABNORMAL HIGH (ref 0–99)
NonHDL: 168.07
Total CHOL/HDL Ratio: 4
Triglycerides: 128 mg/dL (ref 0.0–149.0)
VLDL: 25.6 mg/dL (ref 0.0–40.0)

## 2023-05-01 LAB — COMPREHENSIVE METABOLIC PANEL
ALT: 13 U/L (ref 0–35)
AST: 18 U/L (ref 0–37)
Albumin: 4.1 g/dL (ref 3.5–5.2)
Alkaline Phosphatase: 54 U/L (ref 39–117)
BUN: 6 mg/dL (ref 6–23)
CO2: 31 meq/L (ref 19–32)
Calcium: 9.4 mg/dL (ref 8.4–10.5)
Chloride: 102 meq/L (ref 96–112)
Creatinine, Ser: 0.83 mg/dL (ref 0.40–1.20)
GFR: 75.54 mL/min (ref >60.00)
Glucose, Bld: 89 mg/dL (ref 70–99)
Potassium: 4 meq/L (ref 3.5–5.1)
Sodium: 141 meq/L (ref 135–145)
Total Bilirubin: 0.5 mg/dL (ref 0.2–1.2)
Total Protein: 6.5 g/dL (ref 6.0–8.3)

## 2023-05-01 LAB — TSH: TSH: 1.64 u[IU]/mL (ref 0.35–5.50)

## 2023-05-01 LAB — MICROALBUMIN / CREATININE URINE RATIO
Creatinine,U: 134.5 mg/dL
Microalb Creat Ratio: 0.5 mg/g (ref 0.0–30.0)
Microalb, Ur: <0.7 mg/dL (ref 0.0–1.9)

## 2023-05-08 ENCOUNTER — Encounter: Payer: Self-pay | Admitting: Family Medicine

## 2023-05-08 ENCOUNTER — Telehealth: Payer: Self-pay | Admitting: Family Medicine

## 2023-05-08 ENCOUNTER — Ambulatory Visit: Payer: BC Managed Care – PPO | Admitting: Family Medicine

## 2023-05-08 VITALS — BP 138/86 | HR 61 | Temp 98.3°F | Ht 64.25 in | Wt 154.0 lb

## 2023-05-08 DIAGNOSIS — E041 Nontoxic single thyroid nodule: Secondary | ICD-10-CM

## 2023-05-08 DIAGNOSIS — Z0000 Encounter for general adult medical examination without abnormal findings: Secondary | ICD-10-CM | POA: Diagnosis not present

## 2023-05-08 DIAGNOSIS — I10 Essential (primary) hypertension: Secondary | ICD-10-CM

## 2023-05-08 DIAGNOSIS — F418 Other specified anxiety disorders: Secondary | ICD-10-CM

## 2023-05-08 DIAGNOSIS — Z23 Encounter for immunization: Secondary | ICD-10-CM

## 2023-05-08 DIAGNOSIS — Z7189 Other specified counseling: Secondary | ICD-10-CM

## 2023-05-08 DIAGNOSIS — G373 Acute transverse myelitis in demyelinating disease of central nervous system: Secondary | ICD-10-CM

## 2023-05-08 DIAGNOSIS — E871 Hypo-osmolality and hyponatremia: Secondary | ICD-10-CM

## 2023-05-08 DIAGNOSIS — R7989 Other specified abnormal findings of blood chemistry: Secondary | ICD-10-CM

## 2023-05-08 MED ORDER — HYDROXYZINE HCL 25 MG PO TABS: 25.0000 mg | ORAL_TABLET | Freq: Every evening | ORAL | 6 refills | Status: DC | PRN

## 2023-05-08 MED ORDER — ESCITALOPRAM OXALATE 20 MG PO TABS: 20.0000 mg | ORAL_TABLET | Freq: Every day | ORAL | 1 refills | Status: DC

## 2023-05-08 MED ORDER — GABAPENTIN 400 MG PO CAPS: 400.0000 mg | ORAL_CAPSULE | Freq: Two times a day (BID) | ORAL | 1 refills | Status: DC

## 2023-05-08 NOTE — Assessment & Plan Note (Signed)
Appreciate neurology care.  

## 2023-05-08 NOTE — Assessment & Plan Note (Addendum)
Overall doing better on higher lexapro dose 20mg  daily.  Anticipate improvement after upcoming planned retirement.  Discussed PRN hydroxize use

## 2023-05-08 NOTE — Assessment & Plan Note (Signed)
Preventative protocols reviewed and updated unless pt declined. Discussed healthy diet and lifestyle.  

## 2023-05-08 NOTE — Assessment & Plan Note (Signed)
Will update thyroid ultrasound.

## 2023-05-08 NOTE — Assessment & Plan Note (Signed)
BP stable off medication 

## 2023-05-08 NOTE — Telephone Encounter (Signed)
Plz notify we're due for rechecking thyroid ultrasound to follow previously seen thyroid nodule - I have ordered to be done at [facility] outpatient imaging  center

## 2023-05-08 NOTE — Assessment & Plan Note (Addendum)
Previously discussed.

## 2023-05-08 NOTE — Progress Notes (Signed)
Ph: [phone] Fax: [phone]   Patient ID: Elizabeth Long, female    DOB: [birth year], 62 y.o.   MRN: [id number]  This visit was conducted in person.  BP 138/86   Pulse 61   Temp 98.3 F (36.8 C) (Oral)   Ht 5' 4.25" (1.632 m)   Wt 154 lb (69.9 kg)   SpO2 95%   BMI 26.23 kg/m    CC: CPE Subjective:   HPI: Elizabeth Long is a 62 y.o. female presenting on 05/08/2023 for Annual Exam (Pt requests refill for gabapentin. )   She is planning to retire 06/06/2023 after 44.5 years.   MS vs transverse myelitis sees neurologist Dr Epimenio Foot regularly. No new lesions on latest MRI.   Has seen Dr Cherylann Ratel for chronic thoracic spine pain, on mobic with voltaren gel. Also takes methocarbamol PRN and gabapentin 400mg  bid - has been out of this for the past 8+ months, requests we take over this prescription. She has spinal cord stimulator in place.  We previously prescribed tylenol #3 for breakthrough pain last #20 10/2021.   Anxiety > depression - continues lexapro 20mg  daily (neuro increased this).   Hyponatremia - improved off hydrochlorothiazide.  Notes worsening neuropathy off imipramine, lamictal dose recently increased.   Preventative: Colonoscopy 09/2016 - 12mm TA, rpt 3 yrs (Nandigam)  Colonoscopy 10/2019 - 11mm HP, int hem, rpt 5 yrs (Nandigam)  Well woman with OBGYN Dr Aldona Bar upcoming appt 06/2022. S/p hysterectomy (endometriosis). Ovaries remain.  Mammogram yearly with OBGYN - 06/2022 Birads1 Lung cancer screening - not eligible as quit 20+ yrs ago Flu shot yearly  COVID vaccine Pfizer 11/2019 x2, booster x2 06/2020, 12/2020 Penumonia shot - not due Tdap 02/2016  Shingrix - 04/2020, 10/2020 Advanced directive - scanned 10/2021. HCPOA is [place]. Does not want life prolonging measures if terminal condition.   Seat belt use discussed  Sunscreen use discussed. No changing moles on skin.  Ex smoker - quit 04/1999. Prior 2 ppd. 20+ PY hx.  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 Interim medical history since our last visit reviewed. Allergies and medications reviewed and updated. Outpatient Medications Prior to Visit  Medication Sig Dispense Refill   cetirizine (ZYRTEC) 10 MG tablet Take 10 mg by mouth daily as needed for allergies.     diclofenac Sodium (VOLTAREN) 1 % GEL Apply 2 g topically daily as needed (pain).     ferrous sulfate 324 (65 Fe) MG TBEC Take 1 tablet (325 mg total) by mouth daily.     fluticasone (FLONASE) 50 MCG/ACT nasal spray Place 2 sprays into both nostrils daily as needed for allergies or rhinitis.     HYDROcodone-acetaminophen (NORCO/VICODIN) 5-325 MG tablet Take 0.5-1 tablets by mouth 2 (two) times daily as needed for moderate pain. 10 tablet 0   lamoTRIgine (LAMICTAL) 150 MG tablet Take 1 pill by mouth twice daily 180 tablet 11   LORazepam (ATIVAN) 2 MG tablet Take 1 mg by mouth at bedtime as needed.  meloxicam (MOBIC) 15 MG tablet Take 1 tablet (15 mg total) by mouth daily as needed for pain. 30 tablet 1   Multiple Vitamins-Minerals (CENTRUM SILVER 50+WOMEN) TABS Take 1 tablet by mouth daily.     omeprazole (PRILOSEC) 20 MG capsule TAKE 1 CAPSULE BY MOUTH EVERY DAY 90 capsule 0   escitalopram (LEXAPRO) 20 MG tablet Take 1 tablet (20 mg total) by mouth daily. 90 tablet 1   gabapentin (NEURONTIN) 400 MG capsule Take 1 capsule (400 mg total) by mouth 2 (two) times daily. 60 capsule 2   hydrOXYzine (ATARAX) 25 MG tablet TAKE 1 TABLET BY MOUTH EVERYDAY AT BEDTIME  90 tablet 0   meloxicam (MOBIC) 7.5 MG tablet TAKE 1 TABLET BY MOUTH DAILY AS NEEDED FOR PAIN 30 tablet 0   methocarbamol (ROBAXIN) 500 MG tablet Take 1 tablet (500 mg total) by mouth every 8 (eight) hours as needed for muscle spasms. 30 tablet 0   No facility-administered medications prior to visit.     Per HPI unless specifically indicated in ROS section below Review of Systems  Constitutional:  Negative for activity change, appetite change, chills, fatigue, fever and unexpected weight change.  HENT:  Negative for hearing loss.   Eyes:  Negative for visual disturbance.  Respiratory:  Negative for cough, chest tightness, shortness of breath and wheezing.   Cardiovascular:  Negative for chest pain, palpitations and leg swelling.  Gastrointestinal:  Negative for abdominal distention, abdominal pain, blood in stool, constipation, diarrhea, nausea and vomiting.  Genitourinary:  Negative for difficulty urinating and hematuria.  Musculoskeletal:  Negative for arthralgias, myalgias and neck pain.  Skin:  Negative for rash.  Neurological:  Negative for dizziness, seizures, syncope and headaches.  Hematological:  Negative for adenopathy. Does not bruise/bleed easily.  Psychiatric/Behavioral:  Negative for dysphoric mood. The patient is not nervous/anxious.     Objective:  BP 138/86   Pulse 61   Temp 98.3 F (36.8 C) (Oral)   Ht 5' 4.25" (1.632 m)   Wt 154 lb (69.9 kg)   SpO2 95%   BMI 26.23 kg/m   Wt Readings from Last 3 Encounters:  05/08/23 154 lb (69.9 kg)  11/16/22 152 lb 8 oz (69.2 kg)  11/01/22 151 lb 6 oz (68.7 kg)      Physical Exam Vitals and nursing note reviewed.  Constitutional:      Appearance: Normal appearance. She is not ill-appearing.  HENT:     Head: Normocephalic and atraumatic.     Right Ear: Tympanic membrane, ear canal and external ear normal. There is no impacted cerumen.     Left Ear: Tympanic membrane, ear canal and external ear normal. There is no  impacted cerumen.     Mouth/Throat:     Mouth: Mucous membranes are moist.     Pharynx: Oropharynx is clear. No oropharyngeal exudate or posterior oropharyngeal erythema.  Eyes:     General:        Right eye: No discharge.        Left eye: No discharge.     Extraocular Movements: Extraocular movements intact.     Conjunctiva/sclera: Conjunctivae normal.     Pupils: Pupils are equal, round, and reactive to light.  Neck:     Thyroid: No thyroid mass or thyromegaly.  Cardiovascular:     Rate and Rhythm: Normal rate and regular rhythm.     Pulses: Normal pulses.     Heart sounds: Normal heart sounds. No murmur heard. Pulmonary:  Effort: Pulmonary effort is normal. No respiratory distress.     Breath sounds: Normal breath sounds. No wheezing, rhonchi or rales.  Abdominal:     General: Bowel sounds are normal. There is no distension.     Palpations: Abdomen is soft. There is no mass.     Tenderness: There is no abdominal tenderness. There is no guarding or rebound.     Hernia: No hernia is present.  Musculoskeletal:     Cervical back: Normal range of motion and neck supple. No rigidity.     Right lower leg: No edema.     Left lower leg: No edema.  Lymphadenopathy:     Cervical: No cervical adenopathy.  Skin:    General: Skin is warm and dry.     Findings: No rash.  Neurological:     General: No focal deficit present.     Mental Status: She is alert. Mental status is at baseline.  Psychiatric:        Mood and Affect: Mood normal.        Behavior: Behavior normal.       Results for orders placed or performed in visit on 05/01/23  Microalbumin / creatinine urine ratio  Result Value Ref Range   Microalb, Ur <0.7 0.0 - 1.9 mg/dL   Creatinine,U 387.5 mg/dL   Microalb Creat Ratio 0.5 0.0 - 30.0 mg/g  CBC with Differential/Platelet  Result Value Ref Range   WBC 4.6 4.0 - 10.5 K/uL   RBC 4.20 3.87 - 5.11 Mil/uL   Hemoglobin 13.1 12.0 - 15.0 g/dL   HCT 64.3 32.9 - 51.8 %    MCV 93.9 78.0 - 100.0 fl   MCHC 33.2 30.0 - 36.0 g/dL   RDW 84.1 66.0 - 63.0 %   Platelets 306.0 150.0 - 400.0 K/uL   Neutrophils Relative % 42.6 (L) 43.0 - 77.0 %   Lymphocytes Relative 45.4 12.0 - 46.0 %   Monocytes Relative 9.1 3.0 - 12.0 %   Eosinophils Relative 2.3 0.0 - 5.0 %   Basophils Relative 0.6 0.0 - 3.0 %   Neutro Abs 2.0 1.4 - 7.7 K/uL   Lymphs Abs 2.1 0.7 - 4.0 K/uL   Monocytes Absolute 0.4 0.1 - 1.0 K/uL   Eosinophils Absolute 0.1 0.0 - 0.7 K/uL   Basophils Absolute 0.0 0.0 - 0.1 K/uL  TSH  Result Value Ref Range   TSH 1.64 0.35 - 5.50 uIU/mL  Comprehensive metabolic panel  Result Value Ref Range   Sodium 141 135 - 145 mEq/L   Potassium 4.0 3.5 - 5.1 mEq/L   Chloride 102 96 - 112 mEq/L   CO2 31 19 - 32 mEq/L   Glucose, Bld 89 70 - 99 mg/dL   BUN 6 6 - 23 mg/dL   Creatinine, Ser 1.60 0.40 - 1.20 mg/dL   Total Bilirubin 0.5 0.2 - 1.2 mg/dL   Alkaline Phosphatase 54 39 - 117 U/L   AST 18 0 - 37 U/L   ALT 13 0 - 35 U/L   Total Protein 6.5 6.0 - 8.3 g/dL   Albumin 4.1 3.5 - 5.2 g/dL   GFR 10.93 >23.55 mL/min   Calcium 9.4 8.4 - 10.5 mg/dL  Lipid panel  Result Value Ref Range   Cholesterol 226 (H) 0 - 200 mg/dL   Triglycerides 732.2 0.0 - 149.0 mg/dL   HDL 02.54 >27.06 mg/dL   VLDL 23.7 0.0 - 62.8 mg/dL   LDL Cholesterol 315 (H) 0 - 99 mg/dL  Total CHOL/HDL Ratio 4    NonHDL 168.07     Assessment & Plan:   Problem List Items Addressed This Visit     Health maintenance examination - Primary (Chronic)    Preventative protocols reviewed and updated unless pt declined. Discussed healthy diet and lifestyle.       Advanced directives, counseling/discussion (Chronic)    Previously discussed      Anxiety associated with depression    Overall doing better on higher lexapro dose 20mg  daily.  Anticipate improvement after upcoming planned retirement.  Discussed PRN hydroxize use      Relevant Medications   hydrOXYzine (ATARAX) 25 MG tablet    escitalopram (LEXAPRO) 20 MG tablet   Essential hypertension    BP stable off medication.       High serum high density lipoprotein (HDL)    Chronic, off med. Strong fmhx CAD.  The 10-year ASCVD risk score (Arnett DK, et al., 2019) is: 4.9%   Values used to calculate the score:     Age: 34 years     Sex: Female     Is Non-Hispanic African American: No     Diabetic: No     Tobacco smoker: No     Systolic Blood Pressure: 138 mmHg     Is BP treated: No     HDL Cholesterol: 58.4 mg/dL     Total Cholesterol: 226 mg/dL       Transverse myelitis (HCC)    Appreciate neurology care.       Thyroid nodule    Will update thyroid ultrasound.       Relevant Orders   US THYROID   Hyponatremia    Resolved off HCTZ      Other Visit Diagnoses     Encounter for immunization       Relevant Orders   Flu vaccine trivalent PF, 6mos and older(Flulaval,Afluria,Fluarix,Fluzone) (Completed)        Meds ordered this encounter  Medications   hydrOXYzine (ATARAX) 25 MG tablet    Sig: Take 1 tablet (25 mg total) by mouth at bedtime as needed for anxiety. Sedation precautions    Dispense:  30 tablet    Refill:  6   escitalopram (LEXAPRO) 20 MG tablet    Sig: Take 1 tablet (20 mg total) by mouth daily.    Dispense:  90 tablet    Refill:  1   gabapentin (NEURONTIN) 400 MG capsule    Sig: Take 1 capsule (400 mg total) by mouth 2 (two) times daily.    Dispense:  180 capsule    Refill:  1    Orders Placed This Encounter  Procedures   US THYROID    Standing Status:   Future    Standing Expiration Date:   05/07/2024    Order Specific Question:   Reason for Exam (SYMPTOM  OR DIAGNOSIS REQUIRED)    Answer:   f/u thyroid nodule    Order Specific Question:   Preferred imaging location?    Answer:   [facility]   Flu vaccine trivalent PF, 6mos and older(Flulaval,Afluria,Fluarix,Fluzone)    Patient Instructions  Flu shot today  Double check on GYN appointment.  Use hydroxyzine  only as needed. I've refilled gabapentin  Good to see you today Return as needed or in 1 year for next physical   Follow up plan: Return in about 1 year (around 05/07/2024) for annual exam, prior fasting for blood work.  Eustaquio Boyden, MD

## 2023-05-08 NOTE — Assessment & Plan Note (Signed)
Chronic, off med. Strong fmhx CAD.  The 10-year ASCVD risk score (Arnett DK, et al., 2019) is: 4.9%   Values used to calculate the score:     Age: 62 years     Sex: Female     Is Non-Hispanic African American: No     Diabetic: No     Tobacco smoker: No     Systolic Blood Pressure: 138 mmHg     Is BP treated: No     HDL Cholesterol: 58.4 mg/dL     Total Cholesterol: 226 mg/dL

## 2023-05-08 NOTE — Patient Instructions (Addendum)
Flu shot today  Double check on GYN appointment.  Use hydroxyzine only as needed. I've refilled gabapentin  Good to see you today Return as needed or in 1 year for next physical

## 2023-05-08 NOTE — Assessment & Plan Note (Signed)
Resolved off HCTZ 

## 2023-05-08 NOTE — Telephone Encounter (Signed)
Spoke with pt relaying Dr Timoteo Expose message. Pt verbalizing understanding and will call [phone] to schedule appt.

## 2023-05-10 ENCOUNTER — Ambulatory Visit
Admission: RE | Admit: 2023-05-10 | Discharge: 2023-05-10 | Disposition: A | Discharge status: Home / Self Care | Payer: BC Managed Care – PPO | Source: Ambulatory Visit | Attending: Family Medicine | Admitting: Family Medicine

## 2023-05-10 DIAGNOSIS — E041 Nontoxic single thyroid nodule: Secondary | ICD-10-CM | POA: Diagnosis not present

## 2023-06-01 ENCOUNTER — Other Ambulatory Visit: Payer: Self-pay | Admitting: Family Medicine

## 2023-06-01 DIAGNOSIS — M7061 Trochanteric bursitis, right hip: Secondary | ICD-10-CM

## 2023-06-02 NOTE — Telephone Encounter (Signed)
Meloxicam Last rx:  02/28/23, #30 Last OV:  05/08/23, CPE Next OV:  none

## 2023-06-02 NOTE — Telephone Encounter (Signed)
Pt mentioned at physical she no longer takes meloxicam.   Lvm asking pt to call back. Need to find out if info above is still accurate concerning meloxicam.

## 2023-06-02 NOTE — Telephone Encounter (Signed)
Copied from CRM [id number]. Topic: Clinical - Medication Question >> Jun 02, 2023 12:32 PM Isabell A wrote: Reason for CRM: Patient returning phone call from [facility] - patient is still taking meloxicam (MOBIC) 15 MG tablet.

## 2023-06-06 ENCOUNTER — Encounter: Payer: Self-pay | Admitting: Family Medicine

## 2023-06-08 MED ORDER — OMEPRAZOLE 20 MG PO CPDR: DELAYED_RELEASE_CAPSULE | ORAL | 0 refills | Status: DC

## 2023-07-02 ENCOUNTER — Other Ambulatory Visit: Payer: Self-pay | Admitting: Family Medicine

## 2023-07-02 DIAGNOSIS — M7061 Trochanteric bursitis, right hip: Secondary | ICD-10-CM

## 2023-07-12 DIAGNOSIS — Z01419 Encounter for gynecological examination (general) (routine) without abnormal findings: Secondary | ICD-10-CM | POA: Diagnosis not present

## 2023-07-12 DIAGNOSIS — Z1231 Encounter for screening mammogram for malignant neoplasm of breast: Secondary | ICD-10-CM | POA: Diagnosis not present

## 2023-07-12 LAB — HM MAMMOGRAPHY

## 2023-07-20 ENCOUNTER — Ambulatory Visit: Payer: 59 | Admitting: Neurology

## 2023-07-20 ENCOUNTER — Encounter: Payer: Self-pay | Admitting: Neurology

## 2023-07-20 VITALS — BP 155/92 | HR 96 | Ht 64.0 in | Wt 160.5 lb

## 2023-07-20 DIAGNOSIS — F418 Other specified anxiety disorders: Secondary | ICD-10-CM | POA: Diagnosis not present

## 2023-07-20 DIAGNOSIS — R269 Unspecified abnormalities of gait and mobility: Secondary | ICD-10-CM

## 2023-07-20 DIAGNOSIS — R3911 Hesitancy of micturition: Secondary | ICD-10-CM | POA: Diagnosis not present

## 2023-07-20 DIAGNOSIS — R208 Other disturbances of skin sensation: Secondary | ICD-10-CM | POA: Diagnosis not present

## 2023-07-20 DIAGNOSIS — R9082 White matter disease, unspecified: Secondary | ICD-10-CM | POA: Diagnosis not present

## 2023-07-20 DIAGNOSIS — G373 Acute transverse myelitis in demyelinating disease of central nervous system: Secondary | ICD-10-CM

## 2023-07-20 DIAGNOSIS — R413 Other amnesia: Secondary | ICD-10-CM | POA: Diagnosis not present

## 2023-07-20 DIAGNOSIS — G4700 Insomnia, unspecified: Secondary | ICD-10-CM

## 2023-07-20 MED ORDER — TAMSULOSIN HCL 0.4 MG PO CAPS: 0.4000 mg | ORAL_CAPSULE | Freq: Every day | ORAL | 11 refills | Status: DC

## 2023-07-20 MED ORDER — ESCITALOPRAM OXALATE 20 MG PO TABS: 20.0000 mg | ORAL_TABLET | Freq: Every day | ORAL | 3 refills | Status: DC

## 2023-07-20 NOTE — Progress Notes (Signed)
[facility]  PATIENT: Elizabeth Long DOB: 02/25/1961  REFERRING DOCTOR OR PCP: Eustaquio Boyden MD SOURCE: Patient, notes from primary care, imaging and lab reports, MRI images of the cervical and lumbar spine personally reviewed  _________________________________   HISTORICAL  CHIEF COMPLAINT:  Chief Complaint  Patient presents with   Room 10    Pt is here Alone. Pt states that she has been okay since her last appointment. Pt states that her legs ache. Pt states that her neck is bothering her a lot more. Pt states that it is hard for her to focus. Pt states that her feet and legs still burn. Pt states that she still has leg spasms. Pt states that she feels like her balance is off.     HISTORY OF PRESENT ILLNESS:  Elizabeth Long is a 63 y.o. woman with numbness and gait issues and T2 hyperintense foci i spinal cord and brain.  Update 07/20/2023 She had a transverse myelitis around 2008 and was found to have foci on her cervical spine in 2011.       Her gait is fine but balance is mildly off - unchanged from last year.   She denies weakness.   She has numbness in both legs.   She has incomplete bladder emptying with frequent double voids and hesitancy.   No recent UTI. .   She denies any visual disturbances but has mild macular degeneration.   She has some neck pain and sometimes has soe pain into the riht upper arm ad sometimes into the hand.    She has a h/o C5C6 ACDF and has C3C4 > C4C5 DJD with foraminal narrowing.     02/15/2023 MRI cervical spine showed a focus at C2-C3 in the spinal cord anterolisthesis and DJD at C3-C4 that could affect the C4 erve roots and DJD at C4-C5 that could affect the right C5 nerve root.    MRI of the brain showed a couple small T2/Flair hyperintense foci in the hemispheres and one in her pons  She notes less depression.   She is on escitalopram.    She notes mild reduced memory bu this is stable.   She has insomnia and takes  gabapentin (400 mg qHS) and lorazepam 2 mg     She has less work since she stopped working [employer]).    She uses reminders at work and home.  She snores a little but no OSA signs.       She has leg discomfort but is doing better with less spasms since starting .  At the last visit we started lamotrigine and titrated to 50 mg po bid.   She has a SCS (Dr[facility] Adriana [facility]) She is no longer on hydrocodone.  She takes Neurontin ad dicofenac.         11/16/2022    3:18 PM  Montreal Cognitive Assessment   Visuospatial/ Executive (0/5) 4  Naming (0/3) 3  Attention: Read list of digits (0/2) 1  Attention: Read list of letters (0/1) 1  Attention: Serial 7 subtraction starting at 100 (0/3) 2  Language: Repeat phrase (0/2) 2  Language : Fluency (0/1) 1  Abstraction (0/2) 2  Delayed Recall (0/5) 2  Orientation (0/6) 6  Total 24  Adjusted Score (based on education) 25      History of neurologic symptoms and subsequent studies: Around 2008, she began to experience symptoms in her neck and arm.  These would fluctuate.  In 2011, an MRI of  the cervical spine showed a focus adjacent to C2-C3 towards the left (not present in 2006).  At the time, she actually had more symptoms in the right arm.  At some point she had surgery at C5-C6 (ACDF).  She did well and have less symptoms for many years but then began to experience more numbness in the arms and legs.  In 2021, she was noted on MRI to have a T2 hyperintense focus adjacent to L1 and may be a second 1 adjacent to T12 in the spinal cord.  She also had degenerative changes.  CSF 08/21/2019 did not show oligoclonal bands.  Repeat imaging in 2020 one of the cervical spine just showed he same C2-C3 focus, diffusion and progressive degenerative changes C4-C5.  MRI of the brain shows only a few T2 hyperintense foci.  I reviewed imaging from 2008, 2011, and 2021.  The focus at C2-C3 appears to have developed between the 2008 and 2011 MRI of the cervical spine.  The  focus in the conus medullaris appears to have occurred after the 2013 MRI and before the 2021 MRI.  There is only 1 MRI of the brain.   Studies:  Imaging:  MRI of the cervical spine 03/18/2010.  The cervical spine shows a left lateral focus at C2-C3 that could be consistent with a demyelinating plaque.  That focus was not present in 2008  MRI of the lumbar spine 07/18/2019  shows a focus adjacent to T12 to the right and possibly 1 within the conus adjacent to L1 to the anterolaterally to the left.   At [facility] disc protrusion could affect left L3 nerve root and at L4L5 protrusion could affect left L5 nerve root.    MRI of the brain and cervical spine 08/13/2019 shows a T2 hyperintense focus within the spinal cord adjacent to C2-C3 which had been seen previously.  There are 2-3 hemispheric small foci, including one juxtacortical one and a focus within the pons.  She has ACDF at C5-C6.  At C4-C5 there is retrolisthesis and other degenerative changes causing foraminal narrowing but no nerve root compression.  This has progressed compared to the previous MRI from 2011.   MRI brain 10/14/2020 showed  Several T2/FLAIR hyperintense foci in the white matter of the hemispheres and in the right pons.  They do not enhance or appear to be acute.  Compared to the MRI from 08/13/2019, there are no new lesions.  Though the overall pattern is nonspecific, the one focus in the periventricular white matter and the other in the juxtacortical white matter on the left could be consistent with chronic demyelinating plaque associated with MS.  They could also represent age-related chronic microvascular ischemic change.  MRI cervical spine 10/14/2020 showed   T2 hyperintense focus within the spinal cord laterally to the left.  The location could be consistent with demyelination from multiple sclerosis.  It does not enhance and is unchanged compared to the 08/13/2019 MRI.  Another T2 hyperintense focus is noted within the pons, also  unchanged.      Remote fusion at C5-C6    At C3-C4, there is 2 mm anterolisthesis and other degenerative changes causing moderately severe right and moderate left foraminal narrowing but no spinal stenosis.  There is potential for right C4 nerve root compression.  This level has slightly progressed compared to the 2021 MRI.     At C4-C5, there are stable degenerative changes including 3 mm retrolisthesis.  There is moderate right foraminal narrowing but no nerve  root compression.  MRI thoracic spine 03/25/2021 showed a focus at T9   DATA: Lumbar puncture 08/21/2019 did not show any bands in the CSF not present in the serum though there were 3 identical gamma restricted bands in the CSF and serum.  IgG index was normal at 0.53.    REVIEW OF SYSTEMS: Constitutional: No fevers, chills, sweats, or change in appetite.   Insomnia and fatigue Eyes: No visual changes, double vision, eye pain Ear, nose and throat: No hearing loss, ear pain, nasal congestion, sore throat Cardiovascular: No chest pain, palpitations Respiratory:  No shortness of breath at rest or with exertion.   No wheezes GastrointestinaI: No nausea, vomiting, diarrhea, abdominal pain, fecal incontinence Genitourinary:  No dysuria, urinary retention or frequency.  No nocturia. Musculoskeletal:  Notes neck pain, back pain Integumentary: No rash, pruritus, skin lesions Neurological: as above Psychiatric: Notes depression and anxiety Endocrine: No palpitations, diaphoresis, change in appetite, change in weigh or increased thirst Hematologic/Lymphatic:  No anemia, purpura, petechiae. Allergic/Immunologic: No itchy/runny eyes, nasal congestion, recent allergic reactions, rashes  ALLERGIES: Allergies  Allergen Reactions   Cefotan [Cefotetan Disodium] Swelling   Elemental Sulfur Rash    Rash all over   Sulfa Antibiotics Rash    HOME MEDICATIONS:  Current Outpatient Medications:    cetirizine (ZYRTEC) 10 MG tablet, Take 10 mg by  mouth daily as needed for allergies., Disp: , Rfl:    diclofenac Sodium (VOLTAREN) 1 % GEL, Apply 2 g topically daily as needed (pain)., Disp: , Rfl:    ferrous sulfate 324 (65 Fe) MG TBEC, Take 1 tablet (325 mg total) by mouth daily., Disp: , Rfl:    fluticasone (FLONASE) 50 MCG/ACT nasal spray, Place 2 sprays into both nostrils daily as needed for allergies or rhinitis., Disp: , Rfl:    gabapentin (NEURONTIN) 400 MG capsule, Take 1 capsule (400 mg total) by mouth 2 (two) times daily., Disp: 180 capsule, Rfl: 1   HYDROcodone-acetaminophen (NORCO/VICODIN) 5-325 MG tablet, Take 0.5-1 tablets by mouth 2 (two) times daily as needed for moderate pain., Disp: 10 tablet, Rfl: 0   hydrOXYzine (ATARAX) 25 MG tablet, Take 1 tablet (25 mg total) by mouth at bedtime as needed for anxiety. Sedation precautions, Disp: 30 tablet, Rfl: 6   lamoTRIgine (LAMICTAL) 150 MG tablet, Take 1 pill by mouth twice daily, Disp: 180 tablet, Rfl: 11   LORazepam (ATIVAN) 2 MG tablet, Take 1 mg by mouth at bedtime as needed., Disp: , Rfl:    meloxicam (MOBIC) 7.5 MG tablet, TAKE 1 TABLET BY MOUTH DAILY AS NEEDED FOR PAIN, Disp: 30 tablet, Rfl: 0   Multiple Vitamins-Minerals (CENTRUM SILVER 50+WOMEN) TABS, Take 1 tablet by mouth daily., Disp: , Rfl:    omeprazole (PRILOSEC) 20 MG capsule, TAKE 1 CAPSULE BY MOUTH EVERY DAY, Disp: 90 capsule, Rfl: 0   tamsulosin (FLOMAX) 0.4 MG CAPS capsule, Take 1 capsule (0.4 mg total) by mouth daily., Disp: 30 capsule, Rfl: 11   escitalopram (LEXAPRO) 20 MG tablet, Take 1 tablet (20 mg total) by mouth daily., Disp: 90 tablet, Rfl: 3  PAST MEDICAL HISTORY: Past Medical History:  Diagnosis Date   Allergic rhinitis    Allergy    Anemia    as a teenager   Anxiety    history of panic attack   Anxiety associated with depression    Chronic headaches    migraines- not any longer   Depression    Essential hypertension 10/24/2015   Family history of adverse reaction  to anesthesia    mother -  N/V   GERD (gastroesophageal reflux disease)    History of alcoholism (HCC)    Sober since 1994 Strong fmhx alcoholism   History of pneumonia    x2   Hyperlipidemia    IBS (irritable bowel syndrome)    Irritable bowel syndrome (IBS)    constipation   Microscopic colitis    Osteoarthritis 2000s   chronic back and shoulder pain s/p surgery   Pneumonia    had x 2 last time age 38   Post menopausal syndrome 10/24/2015   Substance abuse (HCC)    alcohol abuse   Tubular adenoma of colon     PAST SURGICAL HISTORY: Past Surgical History:  Procedure Laterality Date   ABDOMINAL HYSTERECTOMY  1990   endometriosis   ANTERIOR CERVICAL DECOMP/DISCECTOMY FUSION  2006   C5/6 HNP ([facility])   APPENDECTOMY     CHOLECYSTECTOMY N/A 10/19/2016   Procedure: LAPAROSCOPIC CHOLECYSTECTOMY;  Surgeon: Almond Lint, MD;  Location: [facility] OR;  Service: General;  Laterality: N/A;   COLONOSCOPY  06/2011   TA, microscopic colitis, rpt 5 yrs ([facility])   COLONOSCOPY  09/2016   12mm polyp - tubular adenoma rpt 3 yrs ([facility]am)   COLONOSCOPY  10/2019   11mm HP, int hem, rpt 5 yrs ([facility]igam)   cyst on ovary     EYE SURGERY Bilateral    Lasik   LUMBAR DISC SURGERY  2002   [facility]   lumbar esi  2010   NOSE SURGERY     POLYPECTOMY     SHOULDER SURGERY Right    Rotatar Cuff   THORACIC LAMINECTOMY FOR SPINAL CORD STIMULATOR N/A 05/24/2021   Procedure: THORACIC SPINAL CORD STIMULATOR AND PULSE GENERATOR PLACEMENT (MEDTRONIC);  Surgeon: Lucy Chris, MD;  Location: [facility] ORS;  Service: Neurosurgery;  Laterality: N/A;   TONSILLECTOMY      FAMILY HISTORY: Family History  Problem Relation Age of Onset   Arthritis Mother    CAD Mother 30       4v bypass   Arthritis Father    Hypertension Father    Deep vein thrombosis Father    Heart disease Father        pacemaker   CAD Father        bypass   Kidney disease Father    Alcohol abuse Brother    Colon polyps Brother    Heart disease Maternal Grandmother         CHF   Lung cancer Maternal Grandfather        (smoker)   Alcohol abuse Paternal Grandmother    Hyperlipidemia Paternal Grandmother    CAD Paternal Grandmother        MI x3   Stroke Paternal Grandmother    Hypertension Paternal Grandmother    Alcohol abuse Paternal Grandfather    Arthritis Paternal Grandfather    Heart disease Paternal Grandfather    Stroke Paternal Grandfather    Diabetes Paternal Grandfather    Throat cancer Maternal Aunt    Throat cancer Maternal Uncle    CAD Paternal Uncle        MI   Stroke Paternal Uncle    Colon cancer Neg Hx    Esophageal cancer Neg Hx    Stomach cancer Neg Hx    Rectal cancer Neg Hx     SOCIAL HISTORY:  Social History   Socioeconomic History   Marital status: Single    Spouse name: Not on file   Number  of children: 0   Years of education: Not on file   Highest education level: Not on file  Occupational History   Occupation: CLERK CLASS 1    Employer: [employer]    Comment: full time  Tobacco Use   Smoking status: Former    Current packs/day: 0.00    Types: Cigarettes    Start date: 04/11/1970    Quit date: 04/11/2000    Years since quitting: 23.2   Smokeless tobacco: Never  Vaping Use   Vaping status: Never Used  Substance and Sexual Activity   Alcohol use: No    Alcohol/week: 0.0 standard drinks of alcohol    Comment: hx   Drug use: No   Sexual activity: Yes    Birth control/protection: None  Other Topics Concern   Not on file  Social History Narrative   Daily caffeine- coffee (3-4 cups per day)   Right handed    Lives alone   Social Drivers of Health   Financial Resource Strain: Not on file  Food Insecurity: Not on file  Transportation Needs: Not on file  Physical Activity: Not on file  Stress: Not on file  Social Connections: Not on file  Intimate Partner Violence: Not on file     PHYSICAL EXAM  Vitals:   07/20/23 1417  BP: (!) 155/92  Pulse: 96  Weight: 160 lb 8 oz (72.8 kg)   Height: 5\' 4"  (1.626 m)    Body mass index is 27.55 kg/m.   General: The patient is well-developed and well-nourished and in no acute distress  HEENT:  Head is [place]/AT.  Sclera are anicteric.  Tympanic membranes are intact.  Neck:    The neck is nontender.   Skin: Extremities are without rash or  edema.   Neurologic Exam  Mental status: The patient is alert and oriented x 3 at the time of the examination. The patient has apparent normal recent and remote memory, with an apparently normal attention span and concentration ability.   Speech is normal.  Cranial nerves: Extraocular movements are full.  . There is good facial sensation to soft touch bilaterally.Facial strength is normal.  Trapezius and sternocleidomastoid strength is normal. No dysarthria is noted.   No obvious hearing deficits are noted.  The Weber did not lateralize  Motor:  Muscle bulk is normal.   Tone is normal. Strength is  5 / 5 in right arm and 4+/5 in triceps and pronators but 5/5 elsewhere in left arm .  4+/5 in left EHL muscle of the foot 5/5 elsewhere  Sensory: Sensory testing shows reduced  vibration in left leg, norma in arms.  Normal touch/temp. .     Coordination: Cerebellar testing reveals good finger-nose-finger and mildly reduced heel-to-shin bilaterally.  Gait and station: Station is normal.   Gait is near normal and tandem is wide.  Romberg is negative.  Reflexes: Deep tendon reflexes are symmetric and normal in arms but increased at knees, left > right. No ankle clonus        DIAGNOSTIC DATA (LABS, IMAGING, TESTING) - I reviewed patient records, labs, notes, testing and imaging myself where available.  Lab Results  Component Value Date   WBC 4.6 05/01/2023   HGB 13.1 05/01/2023   HCT 39.4 05/01/2023   MCV 93.9 05/01/2023   PLT 306.0 05/01/2023      Component Value Date/Time   NA 141 05/01/2023 0742   NA 139 10/24/2012 0000   K 4.0 05/01/2023 0742   K  4.1 10/24/2012 0000   CL 102  05/01/2023 0742   CO2 31 05/01/2023 0742   GLUCOSE 89 05/01/2023 0742   BUN 6 05/01/2023 0742   CREATININE 0.83 05/01/2023 0742   CREATININE 0.95 11/01/2019 1554   CALCIUM 9.4 05/01/2023 0742   CALCIUM 10.5 10/24/2012 0000   PROT 6.5 05/01/2023 0742   ALBUMIN 4.1 05/01/2023 0742   ALBUMIN 4.1 08/21/2019 0913   AST 18 05/01/2023 0742   ALT 13 05/01/2023 0742   ALKPHOS 54 05/01/2023 0742   BILITOT 0.5 05/01/2023 0742   GFRNONAA >60 10/19/2016 0955   GFRAA >60 10/19/2016 0955   Lab Results  Component Value Date   CHOL 226 (H) 05/01/2023   HDL 58.40 05/01/2023   LDLCALC 142 (H) 05/01/2023   TRIG 128.0 05/01/2023   CHOLHDL 4 05/01/2023   Lab Results  Component Value Date   HGBA1C 5.7 03/07/2018   Lab Results  Component Value Date   VITAMINB12 939 (H) 04/26/2021   Lab Results  Component Value Date   TSH 1.64 05/01/2023       ASSESSMENT AND PLAN  Transverse myelitis (HCC)  Gait disturbance  Memory loss  Insomnia, unspecified type  Depression with anxiety  Burning sensation of feet  White matter abnormality on MRI of brain  Urinary hesitancy    1.    She has 3 foci in the spinal cord worrisome for MS but the brain has been normal for age with just a couple T2/FLAIR hyperintense foci.  There has been no change in MRIs over the last decade though there was progression between 2006 and 2011.    CSF did not show OCB.  Recent MRI of brain and cervical spine showed no new lesions.  Consider re-check in 2-3 years 2.   She will continue gabapentin  q Evening and qHS.   We will continue lamotrigine to 100 mg po bid.  She is tolerating it well      3.  Start tamsulosin for urinary hesitancy 3.    Return in 12 months or sooner if there are new or worsening neurologic symptoms.     42-minute office visit with the majority of the time spent face-to-face for history and physical, discussion/counseling and decision-making.  Additional time with record review and  documentation.    Geraline Halberstadt A. Epimenio Foot, MD, [employer] 07/20/2023, 3:11 PM Certified in Neurology, Clinical Neurophysiology, Sleep Medicine and Neuroimaging  [facility] [address], [address] [place], [place] [address] [phone]

## 2023-08-01 ENCOUNTER — Other Ambulatory Visit: Payer: Self-pay | Admitting: Family Medicine

## 2023-08-01 DIAGNOSIS — M7061 Trochanteric bursitis, right hip: Secondary | ICD-10-CM

## 2023-08-01 NOTE — Telephone Encounter (Signed)
 Meloxicam Last rx:  07/03/23, #30 Last OV:  05/08/23, CPE Next OV:  none

## 2023-08-02 ENCOUNTER — Other Ambulatory Visit: Payer: Self-pay | Admitting: Family Medicine

## 2023-08-02 DIAGNOSIS — F418 Other specified anxiety disorders: Secondary | ICD-10-CM

## 2023-08-02 NOTE — Telephone Encounter (Signed)
 Too soon. Rx sent 05/08/23, #30/6 refills to [facility].   Request denied.

## 2023-09-02 ENCOUNTER — Other Ambulatory Visit: Payer: Self-pay | Admitting: Family Medicine

## 2023-09-02 DIAGNOSIS — M7061 Trochanteric bursitis, right hip: Secondary | ICD-10-CM

## 2023-09-04 NOTE — Telephone Encounter (Signed)
 Meloxicam Last filled:  08/02/23, #30 Last OV:  05/08/23, CPE Next OV:  none

## 2023-09-05 ENCOUNTER — Other Ambulatory Visit: Payer: Self-pay | Admitting: Family Medicine

## 2023-09-16 ENCOUNTER — Other Ambulatory Visit: Payer: Self-pay | Admitting: Family Medicine

## 2023-09-16 DIAGNOSIS — F418 Other specified anxiety disorders: Secondary | ICD-10-CM

## 2023-09-18 NOTE — Telephone Encounter (Signed)
 Message from pharmacy:  REQUEST FOR 90 DAYS PRESCRIPTION.   Last filled:  08/24/23, #30 Last OV:  05/08/23, CPE Next OV:  none

## 2023-09-18 NOTE — Telephone Encounter (Signed)
 ERx

## 2023-10-29 ENCOUNTER — Other Ambulatory Visit: Payer: Self-pay | Admitting: Family Medicine

## 2023-11-18 ENCOUNTER — Other Ambulatory Visit: Payer: Self-pay | Admitting: Family Medicine

## 2023-11-18 DIAGNOSIS — M7061 Trochanteric bursitis, right hip: Secondary | ICD-10-CM

## 2023-11-20 NOTE — Telephone Encounter (Signed)
 Meloxicam  Last filled:  10/22/23, #30 Last OV:  05/08/23, CPE Next OV:  none

## 2023-11-23 ENCOUNTER — Encounter: Payer: Self-pay | Admitting: Family Medicine

## 2023-11-23 DIAGNOSIS — M7061 Trochanteric bursitis, right hip: Secondary | ICD-10-CM

## 2023-11-27 MED ORDER — MELOXICAM 15 MG PO TABS: 15.0000 mg | ORAL_TABLET | Freq: Every day | ORAL | 1 refills | Status: DC | PRN

## 2024-01-11 ENCOUNTER — Other Ambulatory Visit: Payer: Self-pay | Admitting: Neurology

## 2024-01-15 NOTE — Telephone Encounter (Signed)
 Last seen on 07/20/23 Follow up scheduled on 07/18/24

## 2024-01-19 ENCOUNTER — Other Ambulatory Visit: Payer: Self-pay | Admitting: Family Medicine

## 2024-01-19 DIAGNOSIS — M7061 Trochanteric bursitis, right hip: Secondary | ICD-10-CM

## 2024-01-19 NOTE — Telephone Encounter (Signed)
 Meloxicam  Last filled:  12/24/23, #30 Last OV:  05/08/23, CPE Next OV:  none

## 2024-02-19 ENCOUNTER — Encounter: Payer: Self-pay | Admitting: Family Medicine

## 2024-02-19 ENCOUNTER — Ambulatory Visit: Payer: Self-pay | Admitting: Family Medicine

## 2024-02-19 VITALS — BP 130/88 | HR 60 | Temp 98.1°F | Ht 64.0 in | Wt 148.4 lb

## 2024-02-19 DIAGNOSIS — G373 Acute transverse myelitis in demyelinating disease of central nervous system: Secondary | ICD-10-CM

## 2024-02-19 DIAGNOSIS — R197 Diarrhea, unspecified: Secondary | ICD-10-CM | POA: Diagnosis not present

## 2024-02-19 DIAGNOSIS — R5382 Chronic fatigue, unspecified: Secondary | ICD-10-CM | POA: Diagnosis not present

## 2024-02-19 DIAGNOSIS — K52839 Microscopic colitis, unspecified: Secondary | ICD-10-CM | POA: Diagnosis not present

## 2024-02-19 LAB — COMPREHENSIVE METABOLIC PANEL WITH GFR
ALT: 12 U/L (ref 0–35)
AST: 20 U/L (ref 0–37)
Albumin: 4.4 g/dL (ref 3.5–5.2)
Alkaline Phosphatase: 53 U/L (ref 39–117)
BUN: 8 mg/dL (ref 6–23)
CO2: 27 meq/L (ref 19–32)
Calcium: 9.5 mg/dL (ref 8.4–10.5)
Chloride: 99 meq/L (ref 96–112)
Creatinine, Ser: 0.72 mg/dL (ref 0.40–1.20)
GFR: 89.09 mL/min (ref >60.00)
Glucose, Bld: 94 mg/dL (ref 70–99)
Potassium: 4.1 meq/L (ref 3.5–5.1)
Sodium: 136 meq/L (ref 135–145)
Total Bilirubin: 0.3 mg/dL (ref 0.2–1.2)
Total Protein: 6.9 g/dL (ref 6.0–8.3)

## 2024-02-19 LAB — CBC WITH DIFFERENTIAL/PLATELET
Basophils Absolute: 0 K/uL (ref 0.0–0.1)
Basophils Relative: 0.4 % (ref 0.0–3.0)
Eosinophils Absolute: 0.1 K/uL (ref 0.0–0.7)
Eosinophils Relative: 0.8 % (ref 0.0–5.0)
HCT: 39.3 % (ref 36.0–46.0)
Hemoglobin: 13.1 g/dL (ref 12.0–15.0)
Lymphocytes Relative: 24.3 % (ref 12.0–46.0)
Lymphs Abs: 1.8 K/uL (ref 0.7–4.0)
MCHC: 33.4 g/dL (ref 30.0–36.0)
MCV: 92.3 fl (ref 78.0–100.0)
Monocytes Absolute: 0.4 K/uL (ref 0.1–1.0)
Monocytes Relative: 5.6 % (ref 3.0–12.0)
Neutro Abs: 5.1 K/uL (ref 1.4–7.7)
Neutrophils Relative %: 68.9 % (ref 43.0–77.0)
Platelets: 322 K/uL (ref 150.0–400.0)
RBC: 4.25 Mil/uL (ref 3.87–5.11)
RDW: 13.1 % (ref 11.5–15.5)
WBC: 7.4 K/uL (ref 4.0–10.5)

## 2024-02-19 LAB — IBC PANEL
Iron: 85 ug/dL (ref 42–145)
Saturation Ratios: 30.4 % (ref 20.0–50.0)
TIBC: 280 ug/dL (ref 250.0–450.0)
Transferrin: 200 mg/dL — ABNORMAL LOW (ref 212.0–360.0)

## 2024-02-19 LAB — TSH: TSH: 1.21 u[IU]/mL (ref 0.35–5.50)

## 2024-02-19 LAB — FERRITIN: Ferritin: 121.5 ng/mL (ref 10.0–291.0)

## 2024-02-19 LAB — SEDIMENTATION RATE: Sed Rate: 40 mm/h — ABNORMAL HIGH (ref 0–30)

## 2024-02-19 LAB — VITAMIN B12: Vitamin B-12: 1069 pg/mL — ABNORMAL HIGH (ref 211–911)

## 2024-02-19 LAB — C-REACTIVE PROTEIN: CRP: <1 mg/dL (ref 0.5–20.0)

## 2024-02-19 MED ORDER — BUDESONIDE 3 MG PO CPEP: 9.0000 mg | ORAL_CAPSULE | Freq: Every day | ORAL | 1 refills | Status: DC

## 2024-02-19 MED ORDER — BUDESONIDE 3 MG PO CPEP: ORAL_CAPSULE | ORAL | 0 refills | Status: DC

## 2024-02-19 NOTE — Progress Notes (Signed)
 Ph: [phone] Fax: [phone]   Patient ID: Elizabeth Long, female    DOB: [birth year], 63 y.o.   MRN: [id number]  This visit was conducted in person.  BP 130/88   Pulse 60   Temp 98.1 F (36.7 C) (Oral)   Ht 5' 4 (1.626 m)   Wt 148 lb 6 oz (67.3 kg)   SpO2 98%   BMI 25.47 kg/m    CC: discuss colitis flare  Subjective:   HPI: Elizabeth Long is a 63 y.o. female presenting on 02/19/2024 for Medical Management of Chronic Issues (Pt is here for Microscopic Colitis flare up and needing prednisone . Started end of May.)   Tough year - father sick s/p surgery and hospitalization and needs another one, issues with nephew's wedding, issues with her hair including bald spots, home issues (ceiling/roof, needed new HVAC unit).  No persistent heat/cold intolerance.   H/o transverse myelitis followed by Dr Vear, last seen 07/2023. Continues gabapentin , lamotrigine , started flomax  for urinary hesitancy - this has been helpful.  Chronic back pain - has spinal cord stimulator.   I've had a flare up of microscopic colitis - since end of May.  Notes increased loose stools especially after eating.  Watery diarrhea x6-7, including 3 times at night.  12 lb weight loss since 07/2023. She states she has been more active and made healthy diet changes.  Last week started having upper and lower abdominal pain described as twisting, no boring pain to back. Some nausea, now better.  No fevers/chills, vomiting, UTI symptoms. No blood in stool. No increased buoyancy.  No recent abx use.  No recent travel.  She uses city water.  No sick contacts around her.  Managing with imodium OTC.  Stopped oral iron and meloxicam  NSAID 12/2023 - no improvement in diarrhea.   Last saw GI Dr Shila 2018  Colonoscopy 2013 - large sessile polyp removed (TA), colon biopsies positive for collagenous colitiis.  Colonoscopy 09/2016 - 12mm TA, rpt 3 yrs (Nandigam)  Colonoscopy 10/2019 - 11mm HP, int hem, rpt 5 yrs  (Nandigam)   She is on several medicines that increase risk of microscopic colitis - meloxicam , Lexapro , omeprazole .   GERD - mild, managed with low dose omeprazole  20mg .  She continues hydroxyzine  25mg  PRN anxiety - discussed temporary increase in this.      Relevant past medical, surgical, family and social history reviewed and updated as indicated. Interim medical history since our last visit reviewed. Allergies and medications reviewed and updated. Outpatient Medications Prior to Visit  Medication Sig Dispense Refill   cycloSPORINE (RESTASIS) 0.05 % ophthalmic emulsion Place 1 drop into the left eye 2 (two) times daily.     cetirizine (ZYRTEC) 10 MG tablet Take 10 mg by mouth daily as needed for allergies.     diclofenac Sodium (VOLTAREN) 1 % GEL Apply 2 g topically daily as needed (pain).     escitalopram  (LEXAPRO ) 20 MG tablet TAKE 1 TABLET BY MOUTH EVERY DAY 90 tablet 1   fluticasone  (FLONASE ) 50 MCG/ACT nasal spray Place 2 sprays into both nostrils daily as needed for allergies or rhinitis.     gabapentin  (NEURONTIN ) 400 MG capsule TAKE 1 CAPSULE BY MOUTH 2 TIMES DAILY. 180 capsule 1   HYDROcodone -acetaminophen  (NORCO/VICODIN) 5-325 MG tablet Take 0.5-1 tablets by mouth 2 (two) times daily as needed for moderate pain. 10 tablet 0   hydrOXYzine  (ATARAX ) 25 MG tablet TAKE 1 TABLET (25 MG TOTAL) BY MOUTH AT BEDTIME AS NEEDED  FOR ANXIETY. SEDATION PRECAUTIONS 90 tablet 1   lamoTRIgine  (LAMICTAL ) 150 MG tablet Take 1 pill by mouth twice daily 180 tablet 11   LORazepam  (ATIVAN ) 2 MG tablet Take 1 mg by mouth at bedtime as needed.     meloxicam  (MOBIC ) 15 MG tablet TAKE 1 TABLET (15 MG TOTAL) BY MOUTH DAILY AS NEEDED. FOR PAIN (Patient not taking: Reported on 02/19/2024) 30 tablet 1   Multiple Vitamins-Minerals (CENTRUM SILVER 50+WOMEN) TABS Take 1 tablet by mouth daily.     omeprazole  (PRILOSEC) 20 MG capsule TAKE 1 CAPSULE BY MOUTH EVERY DAY 30 capsule 2   tamsulosin  (FLOMAX ) 0.4 MG CAPS  capsule Take 1 capsule (0.4 mg total) by mouth daily. 30 capsule 11   ferrous sulfate  324 (65 Fe) MG TBEC Take 1 tablet (325 mg total) by mouth daily.     No facility-administered medications prior to visit.     Per HPI unless specifically indicated in ROS section below Review of Systems  Objective:  BP 130/88   Pulse 60   Temp 98.1 F (36.7 C) (Oral)   Ht 5' 4 (1.626 m)   Wt 148 lb 6 oz (67.3 kg)   SpO2 98%   BMI 25.47 kg/m   Wt Readings from Last 3 Encounters:  02/19/24 148 lb 6 oz (67.3 kg)  07/20/23 160 lb 8 oz (72.8 kg)  05/08/23 154 lb (69.9 kg)      Physical Exam Vitals and nursing note reviewed.  Constitutional:      Appearance: Normal appearance. She is not ill-appearing.  HENT:     Head: Normocephalic and atraumatic.     Mouth/Throat:     Mouth: Mucous membranes are moist.     Pharynx: Oropharynx is clear. No oropharyngeal exudate or posterior oropharyngeal erythema.  Eyes:     Extraocular Movements: Extraocular movements intact.     Pupils: Pupils are equal, round, and reactive to light.  Cardiovascular:     Rate and Rhythm: Normal rate and regular rhythm.     Pulses: Normal pulses.     Heart sounds: Normal heart sounds. No murmur heard. Pulmonary:     Effort: Pulmonary effort is normal. No respiratory distress.     Breath sounds: Normal breath sounds. No wheezing, rhonchi or rales.  Abdominal:     General: Bowel sounds are normal. There is no distension.     Palpations: Abdomen is soft. There is no mass.     Tenderness: There is abdominal tenderness (mild) in the left lower quadrant. There is no guarding or rebound. Negative signs include Murphy's sign.     Hernia: No hernia is present.  Musculoskeletal:     Right lower leg: No edema.     Left lower leg: No edema.  Skin:    General: Skin is warm and dry.     Capillary Refill: Capillary refill takes less than 2 seconds.     Findings: No rash.  Neurological:     Mental Status: She is alert.   Psychiatric:        Mood and Affect: Mood normal.        Behavior: Behavior normal.       Results for orders placed or performed in visit on 02/19/24  Comprehensive metabolic panel with GFR   Collection Time: 02/19/24  9:41 AM  Result Value Ref Range   Sodium 136 135 - 145 mEq/L   Potassium 4.1 3.5 - 5.1 mEq/L   Chloride 99 96 - 112 mEq/L  CO2 27 19 - 32 mEq/L   Glucose, Bld 94 70 - 99 mg/dL   BUN 8 6 - 23 mg/dL   Creatinine, Ser 9.27 0.40 - 1.20 mg/dL   Total Bilirubin 0.3 0.2 - 1.2 mg/dL   Alkaline Phosphatase 53 39 - 117 U/L   AST 20 0 - 37 U/L   ALT 12 0 - 35 U/L   Total Protein 6.9 6.0 - 8.3 g/dL   Albumin 4.4 3.5 - 5.2 g/dL   GFR 10.90 >39.99 mL/min   Calcium 9.5 8.4 - 10.5 mg/dL  CBC with Differential/Platelet   Collection Time: 02/19/24  9:41 AM  Result Value Ref Range   WBC 7.4 4.0 - 10.5 K/uL   RBC 4.25 3.87 - 5.11 Mil/uL   Hemoglobin 13.1 12.0 - 15.0 g/dL   HCT 60.6 63.9 - 53.9 %   MCV 92.3 78.0 - 100.0 fl   MCHC 33.4 30.0 - 36.0 g/dL   RDW 86.8 88.4 - 84.4 %   Platelets 322.0 150.0 - 400.0 K/uL   Neutrophils Relative % 68.9 43.0 - 77.0 %   Lymphocytes Relative 24.3 12.0 - 46.0 %   Monocytes Relative 5.6 3.0 - 12.0 %   Eosinophils Relative 0.8 0.0 - 5.0 %   Basophils Relative 0.4 0.0 - 3.0 %   Neutro Abs 5.1 1.4 - 7.7 K/uL   Lymphs Abs 1.8 0.7 - 4.0 K/uL   Monocytes Absolute 0.4 0.1 - 1.0 K/uL   Eosinophils Absolute 0.1 0.0 - 0.7 K/uL   Basophils Absolute 0.0 0.0 - 0.1 K/uL  Sedimentation rate   Collection Time: 02/19/24  9:41 AM  Result Value Ref Range   Sed Rate 40 (H) 0 - 30 mm/hr  TSH   Collection Time: 02/19/24  9:41 AM  Result Value Ref Range   TSH 1.21 0.35 - 5.50 uIU/mL  C-reactive protein   Collection Time: 02/19/24  9:41 AM  Result Value Ref Range   CRP <1.0 0.5 - 20.0 mg/dL  Vitamin A87   Collection Time: 02/19/24  9:41 AM  Result Value Ref Range   Vitamin B-12 1,069 (H) 211 - 911 pg/mL  Ferritin   Collection Time: 02/19/24  9:41  AM  Result Value Ref Range   Ferritin 121.5 10.0 - 291.0 ng/mL  IBC panel   Collection Time: 02/19/24  9:41 AM  Result Value Ref Range   Iron 85 42 - 145 ug/dL   Transferrin 799.9 (L) 212.0 - 360.0 mg/dL   Saturation Ratios 69.5 20.0 - 50.0 %   TIBC 280.0 250.0 - 450.0 mcg/dL      0/84/7974    1:49 AM 05/08/2023    2:47 PM 11/01/2022    8:02 AM 05/02/2022    3:13 PM 05/02/2022    2:21 PM  Depression screen PHQ 2/9  Decreased Interest 0 0 0 1 0  Down, Depressed, Hopeless 3 0 0 0 0  PHQ - 2 Score 3 0 0 1 0  Altered sleeping 3 3 1 1    Tired, decreased energy 3 3 2 1    Change in appetite 0 3 3 3    Feeling bad or failure about yourself  3 0 0 0   Trouble concentrating 3 2 0 1   Moving slowly or fidgety/restless 0 0 0 0   Suicidal thoughts 0 0 0 0   PHQ-9 Score 15 11 6 7    Difficult doing work/chores Not difficult at all  Somewhat difficult Not difficult at all  02/19/2024    8:50 AM 05/08/2023    2:47 PM 11/01/2022    8:02 AM 05/02/2022    3:14 PM  GAD 7 : Generalized Anxiety Score  Nervous, Anxious, on Edge 3 1 2 1   Control/stop worrying 3 1 2  0  Worry too much - different things 3 1 2  0  Trouble relaxing 3 1 2 1   Restless 2 1 0 0  Easily annoyed or irritable 2 1 1 1   Afraid - awful might happen 0 0 0 0  Total GAD 7 Score 16 6 9 3   Anxiety Difficulty Somewhat difficult Somewhat difficult Somewhat difficult Not difficult at all   Assessment & Plan:   Problem List Items Addressed This Visit     Microscopic colitis   H/o this, concern for recurrence - see above.       Transverse myelitis [facility])   Appreciate neurology care.       Watery diarrhea - Primary   Months of watery diarrhea in setting of biopsy confirmed collagenous colitis in 2013 suspect recurrent exacerbation in setting of increased stressors this year.  Will need to r/o bacterial infection - sent GI pathogen panel and C diff testing.  Will also further evaluate with labwork including inflammatory  markers, CBC, CMP and thyroid .  If unrevealing, would start budesonide  9mg  weekly x 6-8 wks then taper by 3mg  q2 wks, consider refer back to GI if not improving.  Reviewed possible inciting medications including lexapro , meloxicam , omeprazole . She has already held meloxicam  without benefit. Rec stop omeprazole , consider changing lexapro  to SNRI.  Pt agrees with plan.  She would be due for rpt colonoscopy 10/2024.       Relevant Orders   Comprehensive metabolic panel with GFR (Completed)   CBC with Differential/Platelet (Completed)   Sedimentation rate (Completed)   TSH (Completed)   C-reactive protein (Completed)   C. difficile GDH and Toxin A/B   Gastrointestinal Pathogen Pnl RT, PCR   Chronic fatigue   Update labs including iron panel and b12 levels.  She stopped oral iron a few weeks ago.       Relevant Orders   Vitamin B12 (Completed)   Ferritin (Completed)   IBC panel (Completed)     No orders of the defined types were placed in this encounter.   Orders Placed This Encounter  Procedures   Comprehensive metabolic panel with GFR   CBC with Differential/Platelet   Sedimentation rate   TSH   C-reactive protein   Vitamin B12   Ferritin   IBC panel   C. difficile GDH and Toxin A/B    Standing Status:   Future    Expiration Date:   02/18/2025   Gastrointestinal Pathogen Pnl RT, PCR    Standing Status:   Future    Expiration Date:   02/18/2025    Patient Instructions  Flu shot today  Labs today.  Sent home with container for stool testing today.  Stay off meloxicam .  Ok to stay off iron.  Hold omeprazole , take pepcid 20mg  twice daily as needed for reflux in its place.  We will need to monitor symptoms on Lexapro  which can contribute to colitis risk.  Try topical Rogaine for women over the counter. If not improving, let me know for referral to dermatology for hair loss.  Good to see you today   Follow up plan: Return in about 3 months (around 05/20/2024), or if  symptoms worsen or fail to improve, for annual exam, prior fasting for  blood work.  Anton Blas, MD

## 2024-02-19 NOTE — Assessment & Plan Note (Addendum)
 Months of watery diarrhea in setting of biopsy confirmed collagenous colitis in 2013 suspect recurrent exacerbation in setting of increased stressors this year.  Will need to r/o bacterial infection - sent GI pathogen panel and C diff testing.  Will also further evaluate with labwork including inflammatory markers, CBC, CMP and thyroid .  If unrevealing, would start budesonide  9mg  weekly x 6-8 wks then taper by 3mg  q2 wks, consider refer back to GI if not improving.  Reviewed possible inciting medications including lexapro , meloxicam , omeprazole . She has already held meloxicam  without benefit. Rec stop omeprazole , consider changing lexapro  to SNRI.  Pt agrees with plan.  She would be due for rpt colonoscopy 10/2024.

## 2024-02-19 NOTE — Assessment & Plan Note (Signed)
Appreciate neurology care.  

## 2024-02-19 NOTE — Assessment & Plan Note (Signed)
 H/o this, concern for recurrence - see above.

## 2024-02-19 NOTE — Patient Instructions (Addendum)
 Flu shot today  Labs today.  Sent home with container for stool testing today.  Stay off meloxicam .  Ok to stay off iron.  Hold omeprazole , take pepcid 20mg  twice daily as needed for reflux in its place.  We will need to monitor symptoms on Lexapro  which can contribute to colitis risk.  Try topical Rogaine for women over the counter. If not improving, let me know for referral to dermatology for hair loss.  Good to see you today

## 2024-02-19 NOTE — Assessment & Plan Note (Signed)
 Update labs including iron panel and b12 levels.  She stopped oral iron a few weeks ago.

## 2024-02-20 ENCOUNTER — Other Ambulatory Visit: Payer: Self-pay | Admitting: Radiology

## 2024-02-20 DIAGNOSIS — R197 Diarrhea, unspecified: Secondary | ICD-10-CM | POA: Diagnosis not present

## 2024-02-21 LAB — C. DIFFICILE GDH AND TOXIN A/B
GDH ANTIGEN: NOT DETECTED
MICRO NUMBER:: 16974174
SPECIMEN QUALITY:: ADEQUATE
TOXIN A AND B: NOT DETECTED

## 2024-02-22 LAB — GASTROINTESTINAL PATHOGEN PNL
CampyloBacter Group: NOT DETECTED
Norovirus GI/GII: NOT DETECTED
Rotavirus A: NOT DETECTED
Salmonella species: NOT DETECTED
Shiga Toxin 1: NOT DETECTED
Shiga Toxin 2: NOT DETECTED
Shigella Species: NOT DETECTED
Vibrio Group: NOT DETECTED
Yersinia enterocolitica: NOT DETECTED

## 2024-03-13 ENCOUNTER — Other Ambulatory Visit: Payer: Self-pay | Admitting: Family Medicine

## 2024-03-14 NOTE — Telephone Encounter (Signed)
 Denied refill. Should be on budesonide  9mg  weekly x 8 wks then taper by 3mg  q2 wks until off (total of 3 months of treatment)

## 2024-03-20 ENCOUNTER — Other Ambulatory Visit: Payer: Self-pay | Admitting: Family Medicine

## 2024-03-20 DIAGNOSIS — M7061 Trochanteric bursitis, right hip: Secondary | ICD-10-CM

## 2024-03-20 NOTE — Telephone Encounter (Signed)
 Meloxicam  Last filled:  02/18/24, #30 Last OV:  02/19/24, diarrhea; chronic fatigue Next OV:  05/28/24, CPE

## 2024-03-26 ENCOUNTER — Encounter: Payer: Self-pay | Admitting: Family Medicine

## 2024-04-07 ENCOUNTER — Other Ambulatory Visit: Payer: Self-pay | Admitting: Family Medicine

## 2024-04-08 NOTE — Telephone Encounter (Signed)
 ERx

## 2024-04-10 ENCOUNTER — Other Ambulatory Visit: Payer: Self-pay | Admitting: Neurology

## 2024-04-10 ENCOUNTER — Encounter: Payer: Self-pay | Admitting: Neurology

## 2024-04-10 MED ORDER — LAMOTRIGINE 200 MG PO TABS: ORAL_TABLET | ORAL | 3 refills | Status: AC

## 2024-04-12 ENCOUNTER — Other Ambulatory Visit: Payer: Self-pay | Admitting: Family Medicine

## 2024-04-12 DIAGNOSIS — F418 Other specified anxiety disorders: Secondary | ICD-10-CM

## 2024-05-16 ENCOUNTER — Encounter: Payer: Self-pay | Admitting: Family Medicine

## 2024-05-18 ENCOUNTER — Other Ambulatory Visit: Payer: Self-pay | Admitting: Family Medicine

## 2024-05-18 DIAGNOSIS — E041 Nontoxic single thyroid nodule: Secondary | ICD-10-CM

## 2024-05-18 DIAGNOSIS — G373 Acute transverse myelitis in demyelinating disease of central nervous system: Secondary | ICD-10-CM

## 2024-05-18 DIAGNOSIS — K52839 Microscopic colitis, unspecified: Secondary | ICD-10-CM

## 2024-05-18 DIAGNOSIS — E7800 Pure hypercholesterolemia, unspecified: Secondary | ICD-10-CM

## 2024-05-21 ENCOUNTER — Other Ambulatory Visit

## 2024-05-21 DIAGNOSIS — K52839 Microscopic colitis, unspecified: Secondary | ICD-10-CM

## 2024-05-21 DIAGNOSIS — E7800 Pure hypercholesterolemia, unspecified: Secondary | ICD-10-CM

## 2024-05-21 LAB — CBC WITH DIFFERENTIAL/PLATELET
Basophils Absolute: 0 K/uL (ref 0.0–0.1)
Basophils Relative: 0.5 % (ref 0.0–3.0)
Eosinophils Absolute: 0 K/uL (ref 0.0–0.7)
Eosinophils Relative: 0.8 % (ref 0.0–5.0)
HCT: 41.4 % (ref 36.0–46.0)
Hemoglobin: 13.9 g/dL (ref 12.0–15.0)
Lymphocytes Relative: 29.4 % (ref 12.0–46.0)
Lymphs Abs: 1.3 K/uL (ref 0.7–4.0)
MCHC: 33.6 g/dL (ref 30.0–36.0)
MCV: 93.3 fl (ref 78.0–100.0)
Monocytes Absolute: 0.3 K/uL (ref 0.1–1.0)
Monocytes Relative: 7.4 % (ref 3.0–12.0)
Neutro Abs: 2.8 K/uL (ref 1.4–7.7)
Neutrophils Relative %: 61.9 % (ref 43.0–77.0)
Platelets: 341 K/uL (ref 150.0–400.0)
RBC: 4.43 Mil/uL (ref 3.87–5.11)
RDW: 12.7 % (ref 11.5–15.5)
WBC: 4.6 K/uL (ref 4.0–10.5)

## 2024-05-21 LAB — COMPREHENSIVE METABOLIC PANEL WITH GFR
ALT: 105 U/L — ABNORMAL HIGH (ref 0–35)
AST: 137 U/L — ABNORMAL HIGH (ref 5–37)
Albumin: 4.4 g/dL (ref 3.5–5.2)
Alkaline Phosphatase: 68 U/L (ref 39–117)
BUN: 6 mg/dL (ref 6–23)
CO2: 31 meq/L (ref 19–32)
Calcium: 10.1 mg/dL (ref 8.4–10.5)
Chloride: 101 meq/L (ref 96–112)
Creatinine, Ser: 0.84 mg/dL (ref 0.40–1.20)
GFR: 73.91 mL/min (ref >60.00)
Glucose, Bld: 88 mg/dL (ref 70–99)
Potassium: 4.3 meq/L (ref 3.5–5.1)
Sodium: 139 meq/L (ref 135–145)
Total Bilirubin: 0.5 mg/dL (ref 0.2–1.2)
Total Protein: 6.8 g/dL (ref 6.0–8.3)

## 2024-05-21 LAB — VITAMIN B12: Vitamin B-12: 1324 pg/mL — ABNORMAL HIGH (ref 211–911)

## 2024-05-21 LAB — LIPID PANEL
Cholesterol: 203 mg/dL — ABNORMAL HIGH (ref 28–200)
HDL: 62.4 mg/dL (ref >39.00)
LDL Cholesterol: 118 mg/dL — ABNORMAL HIGH (ref 0–99)
NonHDL: 140.35
Total CHOL/HDL Ratio: 3
Triglycerides: 112 mg/dL (ref 0.0–149.0)
VLDL: 22.4 mg/dL (ref 0.0–40.0)

## 2024-05-21 LAB — SEDIMENTATION RATE: Sed Rate: 13 mm/h (ref 0–30)

## 2024-05-21 LAB — C-REACTIVE PROTEIN: CRP: 0.7 mg/dL (ref 0.5–20.0)

## 2024-05-22 ENCOUNTER — Other Ambulatory Visit: Payer: Self-pay | Admitting: Family Medicine

## 2024-05-26 LAB — LIPOPROTEIN A (LPA): Lipoprotein (a): 11 nmol/L

## 2024-05-28 ENCOUNTER — Encounter: Payer: Self-pay | Admitting: Family Medicine

## 2024-05-28 ENCOUNTER — Ambulatory Visit: Admitting: Family Medicine

## 2024-05-28 ENCOUNTER — Telehealth: Payer: Self-pay

## 2024-05-28 VITALS — BP 136/82 | HR 67 | Temp 97.5°F | Ht 64.0 in | Wt 145.8 lb

## 2024-05-28 DIAGNOSIS — Z0001 Encounter for general adult medical examination with abnormal findings: Secondary | ICD-10-CM | POA: Diagnosis not present

## 2024-05-28 DIAGNOSIS — G373 Acute transverse myelitis in demyelinating disease of central nervous system: Secondary | ICD-10-CM | POA: Diagnosis not present

## 2024-05-28 DIAGNOSIS — K219 Gastro-esophageal reflux disease without esophagitis: Secondary | ICD-10-CM

## 2024-05-28 DIAGNOSIS — I10 Essential (primary) hypertension: Secondary | ICD-10-CM

## 2024-05-28 DIAGNOSIS — E041 Nontoxic single thyroid nodule: Secondary | ICD-10-CM | POA: Diagnosis not present

## 2024-05-28 DIAGNOSIS — M545 Low back pain, unspecified: Secondary | ICD-10-CM

## 2024-05-28 DIAGNOSIS — E7800 Pure hypercholesterolemia, unspecified: Secondary | ICD-10-CM

## 2024-05-28 DIAGNOSIS — Z0000 Encounter for general adult medical examination without abnormal findings: Secondary | ICD-10-CM

## 2024-05-28 DIAGNOSIS — Z87891 Personal history of nicotine dependence: Secondary | ICD-10-CM

## 2024-05-28 DIAGNOSIS — K589 Irritable bowel syndrome without diarrhea: Secondary | ICD-10-CM

## 2024-05-28 DIAGNOSIS — Z23 Encounter for immunization: Secondary | ICD-10-CM | POA: Diagnosis not present

## 2024-05-28 DIAGNOSIS — R7401 Elevation of levels of liver transaminase levels: Secondary | ICD-10-CM

## 2024-05-28 DIAGNOSIS — F418 Other specified anxiety disorders: Secondary | ICD-10-CM

## 2024-05-28 DIAGNOSIS — Z9689 Presence of other specified functional implants: Secondary | ICD-10-CM

## 2024-05-28 DIAGNOSIS — K52839 Microscopic colitis, unspecified: Secondary | ICD-10-CM | POA: Diagnosis not present

## 2024-05-28 LAB — HEPATIC FUNCTION PANEL
ALT: 22 U/L (ref 3–35)
AST: 24 U/L (ref 5–37)
Albumin: 4.4 g/dL (ref 3.5–5.2)
Alkaline Phosphatase: 60 U/L (ref 39–117)
Bilirubin, Direct: 0.1 mg/dL (ref 0.1–0.3)
Total Bilirubin: 0.3 mg/dL (ref 0.2–1.2)
Total Protein: 7 g/dL (ref 6.0–8.3)

## 2024-05-28 LAB — CK: Total CK: 95 U/L (ref 17–177)

## 2024-05-28 LAB — GAMMA GT: GGT: 46 U/L (ref 7–51)

## 2024-05-28 MED ORDER — BUDESONIDE 3 MG PO CPEP: ORAL_CAPSULE | ORAL | 1 refills | Status: AC

## 2024-05-28 MED ORDER — FAMOTIDINE 20 MG PO TABS: 20.0000 mg | ORAL_TABLET | Freq: Every day | ORAL | Status: AC

## 2024-05-28 MED ORDER — MAGNESIUM 250 MG PO CAPS: 1.0000 | ORAL_CAPSULE | Freq: Every day | ORAL | Status: AC

## 2024-05-28 NOTE — Telephone Encounter (Signed)
 Copied from CRM #[id number]. Topic: Clinical - Request for Lab/Test Order >> May 28, 2024  3:34 PM Drema MATSU wrote: Reason for CRM: [facility] is not in pt network. She wants ultrasound sent to [facility] if it is in her network. Please call pt with an update.

## 2024-05-28 NOTE — Patient Instructions (Addendum)
 Repeat liver test today  I will order abdominal ultrasound to [place] [facility]. You may call them to schedule an appointment.  Prevnar-20 (pneumonia shot) today  Restart budesonide  3mg  3 pills daily for 2 weeks then 2 pills daily for 2 weeks then 1 pill daily.  I will refer you back to Dr Shila - you may call [facility] to schedule an appointment at [phone].   We will request latest mammogram from [facility].  Return in 3 months for follow up visit

## 2024-05-28 NOTE — Progress Notes (Signed)
 " Ph: [phone] Fax: [phone]   Patient ID: Elizabeth Long, female    DOB: 04-20-1961, 63 y.o.   MRN: [id number]  This visit was conducted in person.  BP 136/82   Pulse 67   Temp (!) 97.5 F (36.4 C) (Oral)   Ht 5' 4 (1.626 m)   Wt 145 lb 12.8 oz (66.1 kg)   SpO2 94%   BMI 25.03 kg/m    CC: CPE Subjective:   HPI: Elizabeth Long is a 63 y.o. female presenting on 05/28/2024 for Annual Exam (Stomach issues are flaring up lately, painful bloating/Pt has been unable to sleep at night but feels tired all day//Wants to be seen here for gyno in future//wants to talk about lorazepam  and hydrocodone  )   She retired 06/06/2023 after 44.5 years.  Stressed over increased cost of insurance.  Notes increased fatigue recently.   H/o transverse myelitis r/o MS followed by Dr Vear yearly, last seen 07/2023. Continues gabapentin , lamotrigine , started flomax  for urinary hesitancy - this has been helpful.   Microscopic colitis flare 10/2023 - treated with budesonide  prolonged taper, fully off 04/2024, ok for 3-4 wks, but symptoms started recurring this month. Managing with OTC imodium. Unable to leave the house due to diarrhea. 7-8 episodes of watery diarrhea per day.   Has seen Dr Marcelino for chronic thoracic spine pain, on mobic  with voltaren gel. Also takes methocarbamol  PRN and gabapentin  400mg  bid. She has spinal cord stimulator in place.   Anxiety > depression - continues lexapro  20mg  daily (neuro increased this). She is taking lorazepam  1mg  nightly for sleep - this has been prescribed by GYN.   Lamictal  increased from 150mg  bid to 200mg  bid last month  Preventative: Colonoscopy 2013 - large sessile polyp removed (TA), colon biopsies positive for collagenous colitiis.  Colonoscopy 09/2016 - 12mm TA, rpt 3 yrs (Nandigam)  Colonoscopy 10/2019 - 11mm HP, int hem, rpt 5 yrs (Nandigam)  Well woman with OBGYN Dr Rox upcoming appt 06/2022. S/p hysterectomy (endometriosis). Ovaries remain.  Insurance no longer covering this appointment. Due for f/u.  Mammogram yearly with OBGYN - 06/2023 - will request records from [facility].  Lung cancer screening - not eligible as quit 20+ yrs ago Flu shot yearly  COVID vaccine Pfizer 11/2019 x2, booster x2 06/2020, 12/2020 Prevnar-20 - today Tdap 02/2016  Shingrix - 04/2020, 10/2020 Advanced directive - scanned 10/2021. HCPOA is Nancyann Long. Does not want life prolonging measures if terminal condition.   Seat belt use discussed  Sunscreen use discussed. No changing moles on skin.  Ex smoker - quit 04/1999. Prior 2 ppd. 20+ PY hx.  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 Interim medical history since our last visit reviewed. Allergies and medications reviewed and updated. Outpatient Medications Prior to Visit  Medication Sig Dispense Refill   cetirizine (ZYRTEC) 10 MG tablet Take 10 mg by mouth daily as needed for allergies.     cycloSPORINE (RESTASIS) 0.05 %  ophthalmic emulsion Place 1 drop into the left eye 2 (two) times daily.     diclofenac Sodium (VOLTAREN) 1 % GEL Apply 2 g topically daily as needed (pain).     escitalopram  (LEXAPRO ) 20 MG tablet TAKE 1 TABLET BY MOUTH EVERY DAY 90 tablet 1   estradiol (ESTRACE) 0.01 % CREA vaginal cream Place 1 g vaginally 2 (two) times a week.     fluticasone  (FLONASE ) 50 MCG/ACT nasal spray Place 2 sprays into both nostrils daily as needed for allergies or rhinitis.      gabapentin  (NEURONTIN ) 400 MG capsule TAKE 1 CAPSULE BY MOUTH 2 TIMES DAILY. 180 capsule 1   HYDROcodone -acetaminophen  (NORCO/VICODIN) 5-325 MG tablet Take 0.5-1 tablets by mouth 2 (two) times daily as needed for moderate pain. 10 tablet 0   hydrOXYzine  (ATARAX ) 25 MG tablet TAKE 1 TABLET (25 MG TOTAL) BY MOUTH AT BEDTIME AS NEEDED FOR ANXIETY. SEDATION PRECAUTIONS 90 tablet 1   lamoTRIgine  (LAMICTAL ) 200 MG tablet Take 1 pill by mouth twice daily 180 tablet 3   tamsulosin  (FLOMAX ) 0.4 MG CAPS capsule Take 1 capsule (0.4 mg total) by mouth daily. 30 capsule 11   LORazepam  (ATIVAN ) 2 MG tablet Take 1 mg by mouth at bedtime as needed.     LORazepam  (ATIVAN ) 2 MG tablet Take 0.5 tablets (1 mg total) by mouth at bedtime as needed. Through OBGYN     meloxicam  (MOBIC ) 15 MG tablet TAKE 1 TABLET (15 MG TOTAL) BY MOUTH DAILY AS NEEDED. FOR PAIN (Patient not taking: Reported on 05/28/2024) 30 tablet 1   Multiple Vitamins-Minerals (CENTRUM SILVER 50+WOMEN) TABS Take 1 tablet by mouth daily. (Patient not taking: Reported on 05/28/2024)     omeprazole  (PRILOSEC) 20 MG capsule TAKE 1 CAPSULE BY MOUTH EVERY DAY (Patient not taking: Reported on 05/28/2024) 30 capsule 2   No facility-administered medications prior to visit.     Per HPI unless specifically indicated in ROS section below Review of Systems  Constitutional:  Positive for fatigue. Negative for activity change, appetite change, chills, fever and unexpected weight change.  HENT:  Negative for hearing loss.   Eyes:  Negative for visual disturbance.  Respiratory:  Negative for cough, chest tightness, shortness of breath and wheezing.   Cardiovascular:  Negative for chest pain, palpitations and leg swelling.  Gastrointestinal:  Positive for diarrhea. Negative for abdominal distention, abdominal pain, blood in stool, constipation, nausea and vomiting.  Endocrine:       No night sweats  Genitourinary:  Negative for difficulty urinating and hematuria.   Musculoskeletal:  Negative for arthralgias, myalgias and neck pain.  Skin:  Negative for rash.  Neurological:  Negative for dizziness, seizures, syncope and headaches.  Hematological:  Negative for adenopathy. Does not bruise/bleed easily.  Psychiatric/Behavioral:  Negative for dysphoric mood. The patient is nervous/anxious.     Objective:  BP 136/82   Pulse 67   Temp (!) 97.5 F (36.4 C) (Oral)   Ht 5' 4 (1.626 m)   Wt 145 lb 12.8 oz (66.1 kg)   SpO2 94%   BMI 25.03 kg/m   Wt Readings from Last 3 Encounters:  05/28/24 145 lb 12.8 oz (66.1 kg)  02/19/24 148 lb 6 oz (67.3 kg)  07/20/23 160 lb 8 oz (72.8 kg)      Physical Exam Vitals and nursing note reviewed.  Constitutional:      Appearance: Normal appearance. She is not ill-appearing.  HENT:     Head: Normocephalic and atraumatic.  Right Ear: Tympanic membrane, ear canal and external ear normal. There is no impacted cerumen.     Left Ear: Tympanic membrane, ear canal and external ear normal. There is no impacted cerumen.     Mouth/Throat:     Mouth: Mucous membranes are moist.     Pharynx: Oropharynx is clear. No oropharyngeal exudate or posterior oropharyngeal erythema.  Eyes:     General:        Right eye: No discharge.        Left eye: No discharge.     Extraocular Movements: Extraocular movements intact.     Conjunctiva/sclera: Conjunctivae normal.     Pupils: Pupils are equal, round, and reactive to light.  Neck:     Thyroid : No thyroid  mass or thyromegaly.  Cardiovascular:     Rate and Rhythm: Normal rate and regular rhythm.     Pulses: Normal pulses.     Heart sounds: Normal heart sounds. No murmur heard. Pulmonary:     Effort: Pulmonary effort is normal. No respiratory distress.     Breath sounds: Normal breath sounds. No wheezing, rhonchi or rales.  Abdominal:     General: Bowel sounds are normal. There is no distension.     Palpations: Abdomen is soft. There is no mass.     Tenderness: There is no  abdominal tenderness. There is no guarding or rebound.     Hernia: No hernia is present.  Musculoskeletal:     Cervical back: Normal range of motion and neck supple. No rigidity.     Right lower leg: No edema.     Left lower leg: No edema.  Lymphadenopathy:     Cervical: No cervical adenopathy.  Skin:    General: Skin is warm and dry.     Findings: No rash.  Neurological:     General: No focal deficit present.     Mental Status: She is alert. Mental status is at baseline.  Psychiatric:        Mood and Affect: Mood normal.        Behavior: Behavior normal.       Results for orders placed or performed in visit on 05/28/24  Hepatic function panel   Collection Time: 05/28/24  1:16 PM  Result Value Ref Range   Total Bilirubin 0.3 0.2 - 1.2 mg/dL   Bilirubin, Direct 0.1 0.1 - 0.3 mg/dL   Alkaline Phosphatase 60 39 - 117 U/L   AST 24 5 - 37 U/L   ALT 22 3 - 35 U/L   Total Protein 7.0 6.0 - 8.3 g/dL   Albumin 4.4 3.5 - 5.2 g/dL  Gamma GT   Collection Time: 05/28/24  1:16 PM  Result Value Ref Range   GGT 46 7 - 51 U/L  CK   Collection Time: 05/28/24  1:16 PM  Result Value Ref Range   Total CK 95 17 - 177 U/L   Lab Results  Component Value Date   IRON 85 02/19/2024   TIBC 280.0 02/19/2024   FERRITIN 121.5 02/19/2024   Assessment & Plan:   Problem List Items Addressed This Visit     Encounter for general adult medical examination with abnormal findings - Primary (Chronic)   Preventative protocols reviewed and updated unless pt declined. Discussed healthy diet and lifestyle.       Microscopic colitis   Recurrence 10/2023 s/p treatment with prolonged budesonide  taper with symptom resolution. Stool tests negative for infection at that time.  3 wks after fully  stopping budesonide  she has developed recurrent watery stools.  Will restart budesonide  9mg  daily x2 wks then 6mg  daily x2 wks then 3mg  daily x1 month.  Recommend return to GI (Nandigam) - new referral placed.  She  is almost due for colonoscopy as well.       Relevant Orders   Ambulatory referral to Gastroenterology   Anxiety associated with depression   Chronic, stable period on lexapro  20mg  daily - discussed SSRI may contribute to microscopic colitis exacerbation.  She has chronically been on lorazepam  1mg  nightly for sleep through her gynecologist. He is no longer an in network provider through her insurance.       Relevant Medications   LORazepam  (ATIVAN ) 2 MG tablet   GERD (gastroesophageal reflux disease)   She stopped PPI - now on pepcid  20mg  bid. Refilled today.       Relevant Medications   famotidine  (PEPCID ) 20 MG tablet   Irritable bowel syndrome (IBS)   Relevant Medications   famotidine  (PEPCID ) 20 MG tablet   Essential hypertension   Stable period off medication.       Chronic lower back pain   Followed by [facility], s/p spinal cord stimulator placement 05/2021.       Relevant Medications   budesonide  (ENTOCORT EC ) 3 MG 24 hr capsule   Hyperlipidemia   Chronic, off medication. Reviewed diet choices to maintain cholesterol control.  Strong fmhx CAD - personal Lp(a) was normal at 11 The 10-year ASCVD risk score (Arnett DK, et al., 2019) is: 4.8%   Values used to calculate the score:     Age: 69 years     Clinically relevant sex: Female     Is Non-Hispanic African American: No     Diabetic: No     Tobacco smoker: No     Systolic Blood Pressure: 136 mmHg     Is BP treated: No     HDL Cholesterol: 62.4 mg/dL     Total Cholesterol: 203 mg/dL       Ex-smoker   Remains without smoking since 2000      Transverse myelitis (HCC)   Vs MS - regularly sees neurology Dr Vear      Thyroid  nodule   She requests to postpone thyroid  imaging at this time due to concern over cost.  May want to postpone until 63yo      Spinal cord stimulator status   Transaminitis   Newly AST/ALT elevation noted on latest labs to 100s.  She denies RUQ discomfort, nausea/vomiting.   ?related to lamictal  as this was recently increased by neurology from 150mg  bid to 200mg  bid on 04/2024.  Update LFTs today including GGT and CPK levels. Will also order abd US  for further evaluation.  No alcohol since 1994 Iron levels normal 02/2024.  15 lb weight loss noted in the past year.   ADDENDUM ==> on repeat LFTs, transaminitis has resolved - will defer abdominal imaging at this time.       Relevant Orders   Hepatic function panel (Completed)   Gamma GT (Completed)   CK (Completed)   Other Visit Diagnoses       Need for vaccination against Streptococcus pneumoniae       Relevant Orders   Pneumococcal conjugate vaccine 20-valent (Prevnar 20) (Completed)        Meds ordered this encounter  Medications   budesonide  (ENTOCORT EC ) 3 MG 24 hr capsule    Sig: Take 3 capsules (9 mg total) by mouth daily for  14 days, THEN 2 capsules (6 mg total) daily for 14 days, THEN 1 capsule (3 mg total) daily.    Dispense:  90 capsule    Refill:  1   famotidine  (PEPCID ) 20 MG tablet    Sig: Take 1 tablet (20 mg total) by mouth daily.   Magnesium  250 MG CAPS    Sig: Take 1 capsule by mouth daily.    Orders Placed This Encounter  Procedures   Pneumococcal conjugate vaccine 20-valent (Prevnar 20)   Hepatic function panel   Gamma GT   CK   Ambulatory referral to Gastroenterology    Referral Priority:   Routine    Referral Type:   Consultation    Referral Reason:   Specialty Services Required    Number of Visits Requested:   1    Patient Instructions  Repeat liver test today  I will order abdominal ultrasound to [facility]. You may call them to schedule an appointment.  Prevnar-20 (pneumonia shot) today  Restart budesonide  3mg  3 pills daily for 2 weeks then 2 pills daily for 2 weeks then 1 pill daily.  I will refer you back to Dr Shila - you may call [facility] to schedule an appointment at [phone].   We will request latest mammogram from [facility].   Return in 3 months for follow up visit   Follow up plan: Return in about 3 months (around 08/26/2024) for follow up visit.  Anton Blas, MD   "

## 2024-05-29 ENCOUNTER — Ambulatory Visit: Payer: Self-pay | Admitting: Family Medicine

## 2024-05-29 DIAGNOSIS — R7401 Elevation of levels of liver transaminase levels: Secondary | ICD-10-CM | POA: Insufficient documentation

## 2024-05-29 NOTE — Assessment & Plan Note (Signed)
 She stopped PPI - now on pepcid  20mg  bid. Refilled today.

## 2024-05-29 NOTE — Telephone Encounter (Signed)
 Called patient reviewed all information and repeated back to me. Will call if any questions.  ? ?

## 2024-05-29 NOTE — Assessment & Plan Note (Signed)
 Vs MS - regularly sees neurology Dr Vear

## 2024-05-29 NOTE — Assessment & Plan Note (Addendum)
 Recurrence 10/2023 s/p treatment with prolonged budesonide  taper with symptom resolution. Stool tests negative for infection at that time.  3 wks after fully stopping budesonide  she has developed recurrent watery stools.  Will restart budesonide  9mg  daily x2 wks then 6mg  daily x2 wks then 3mg  daily x1 month.  Recommend return to GI (Nandigam) - new referral placed.  She is almost due for colonoscopy as well.

## 2024-05-29 NOTE — Assessment & Plan Note (Signed)
 Followed by [facility] Elizabeth Long, s/p spinal cord stimulator placement 05/2021.

## 2024-05-29 NOTE — Assessment & Plan Note (Signed)
 Preventative protocols reviewed and updated unless pt declined. Discussed healthy diet and lifestyle.

## 2024-05-29 NOTE — Assessment & Plan Note (Signed)
 Chronic, off medication. Reviewed diet choices to maintain cholesterol control.  Strong fmhx CAD - personal Lp(a) was normal at 11 The 10-year ASCVD risk score (Arnett DK, et al., 2019) is: 4.8%   Values used to calculate the score:     Age: 63 years     Clinically relevant sex: Female     Is Non-Hispanic African American: No     Diabetic: No     Tobacco smoker: No     Systolic Blood Pressure: 136 mmHg     Is BP treated: No     HDL Cholesterol: 62.4 mg/dL     Total Cholesterol: 203 mg/dL

## 2024-05-29 NOTE — Telephone Encounter (Addendum)
 Actually - her liver function on recheck was back to normal. Let's hold off on abdominal imaging at this time. I have cancelled order.

## 2024-05-29 NOTE — Assessment & Plan Note (Signed)
 Remains without smoking since 2000

## 2024-05-29 NOTE — Assessment & Plan Note (Addendum)
 Newly AST/ALT elevation noted on latest labs to 100s.  She denies RUQ discomfort, nausea/vomiting.  ?related to lamictal  as this was recently increased by neurology from 150mg  bid to 200mg  bid on 04/2024.  Update LFTs today including GGT and CPK levels. Will also order abd US  for further evaluation.  No alcohol since 1994 Iron levels normal 02/2024.  15 lb weight loss noted in the past year.   ADDENDUM ==> on repeat LFTs, transaminitis has resolved - will defer abdominal imaging at this time.

## 2024-05-29 NOTE — Assessment & Plan Note (Signed)
 She requests to postpone thyroid  imaging at this time due to concern over cost.  May want to postpone until 63yo

## 2024-05-29 NOTE — Assessment & Plan Note (Signed)
 Chronic, stable period on lexapro  20mg  daily - discussed SSRI may contribute to microscopic colitis exacerbation.  She has chronically been on lorazepam  1mg  nightly for sleep through her gynecologist. He is no longer an in network provider through her insurance.

## 2024-05-29 NOTE — Assessment & Plan Note (Signed)
 Stable period off medication.

## 2024-06-12 ENCOUNTER — Other Ambulatory Visit: Payer: Self-pay | Admitting: Family Medicine

## 2024-06-12 DIAGNOSIS — M7061 Trochanteric bursitis, right hip: Secondary | ICD-10-CM

## 2024-06-21 ENCOUNTER — Encounter: Payer: Self-pay | Admitting: Gastroenterology

## 2024-07-05 ENCOUNTER — Other Ambulatory Visit: Payer: Self-pay | Admitting: Neurology

## 2024-07-05 ENCOUNTER — Other Ambulatory Visit: Payer: Self-pay | Admitting: Family Medicine

## 2024-07-05 DIAGNOSIS — F418 Other specified anxiety disorders: Secondary | ICD-10-CM

## 2024-07-10 ENCOUNTER — Ambulatory Visit: Payer: Self-pay | Admitting: Gastroenterology

## 2024-07-18 ENCOUNTER — Ambulatory Visit: Payer: 59 | Admitting: Neurology

## 2024-08-08 ENCOUNTER — Ambulatory Visit: Payer: Self-pay | Admitting: Gastroenterology

## 2024-08-09 ENCOUNTER — Encounter

## 2024-08-27 ENCOUNTER — Ambulatory Visit: Admitting: Family Medicine
